# Patient Record
Sex: Female | Born: 1945
Health system: Southern US, Community
[De-identification: ages and names within clinical notes are randomized; demographics above are authoritative.]

## PROBLEM LIST (undated history)

## (undated) ENCOUNTER — Inpatient Hospital Stay: Admission: EM | Payer: Self-pay | Source: Home / Self Care

## (undated) DIAGNOSIS — G8929 Other chronic pain: Secondary | ICD-10-CM

## (undated) DIAGNOSIS — M549 Dorsalgia, unspecified: Secondary | ICD-10-CM

## (undated) DIAGNOSIS — J84112 Idiopathic pulmonary fibrosis: Secondary | ICD-10-CM

## (undated) DIAGNOSIS — N189 Chronic kidney disease, unspecified: Secondary | ICD-10-CM

## (undated) DIAGNOSIS — K219 Gastro-esophageal reflux disease without esophagitis: Secondary | ICD-10-CM

## (undated) DIAGNOSIS — R591 Generalized enlarged lymph nodes: Secondary | ICD-10-CM

## (undated) DIAGNOSIS — E7800 Pure hypercholesterolemia, unspecified: Secondary | ICD-10-CM

## (undated) DIAGNOSIS — F1910 Other psychoactive substance abuse, uncomplicated: Secondary | ICD-10-CM

## (undated) HISTORY — PX: ANTERIOR CERVICAL DISCECTOMY: SHX1160

## (undated) HISTORY — PX: SPINE SURGERY: SHX786

## (undated) HISTORY — DX: Idiopathic pulmonary fibrosis: J84.112

## (undated) HISTORY — PX: BACK SURGERY: SHX140

## (undated) HISTORY — PX: POSTERIOR CERVICAL FUSION/FORAMINOTOMY: SHX5038

## (undated) HISTORY — PX: CARPAL TUNNEL WITH CUBITAL TUNNEL: SHX5608

## (undated) HISTORY — DX: Generalized enlarged lymph nodes: R59.1

## (undated) HISTORY — DX: Chronic kidney disease, unspecified: N18.9

## (undated) HISTORY — PX: POSTERIOR LUMBAR FUSION: SHX6036

## (undated) HISTORY — DX: Other psychoactive substance abuse, uncomplicated: F19.10

## (undated) HISTORY — PX: LUMBAR DISC SURGERY: SHX700

---

## 1999-04-12 ENCOUNTER — Emergency Department (HOSPITAL_COMMUNITY): Admission: EM | Admit: 1999-04-12 | Discharge: 1999-04-12 | Payer: Self-pay | Admitting: Emergency Medicine

## 2000-11-26 ENCOUNTER — Encounter: Payer: Self-pay | Admitting: *Deleted

## 2000-11-26 ENCOUNTER — Emergency Department (HOSPITAL_COMMUNITY): Admission: EM | Admit: 2000-11-26 | Discharge: 2000-11-26 | Payer: Self-pay | Admitting: *Deleted

## 2002-10-26 ENCOUNTER — Emergency Department (HOSPITAL_COMMUNITY): Admission: EM | Admit: 2002-10-26 | Discharge: 2002-10-26 | Payer: Self-pay | Admitting: Emergency Medicine

## 2002-10-26 ENCOUNTER — Encounter: Payer: Self-pay | Admitting: Emergency Medicine

## 2003-01-31 ENCOUNTER — Encounter: Payer: Self-pay | Admitting: *Deleted

## 2003-01-31 ENCOUNTER — Emergency Department (HOSPITAL_COMMUNITY): Admission: EM | Admit: 2003-01-31 | Discharge: 2003-01-31 | Payer: Self-pay | Admitting: Emergency Medicine

## 2003-03-15 ENCOUNTER — Ambulatory Visit (HOSPITAL_COMMUNITY): Admission: RE | Admit: 2003-03-15 | Discharge: 2003-03-15 | Payer: Self-pay | Admitting: *Deleted

## 2003-03-15 ENCOUNTER — Encounter: Payer: Self-pay | Admitting: *Deleted

## 2003-07-01 ENCOUNTER — Emergency Department (HOSPITAL_COMMUNITY): Admission: EM | Admit: 2003-07-01 | Discharge: 2003-07-02 | Payer: Self-pay | Admitting: Emergency Medicine

## 2003-07-14 ENCOUNTER — Emergency Department (HOSPITAL_COMMUNITY): Admission: AD | Admit: 2003-07-14 | Discharge: 2003-07-14 | Payer: Self-pay | Admitting: Family Medicine

## 2003-07-19 ENCOUNTER — Emergency Department (HOSPITAL_COMMUNITY): Admission: AD | Admit: 2003-07-19 | Discharge: 2003-07-19 | Payer: Self-pay | Admitting: Family Medicine

## 2003-07-30 ENCOUNTER — Emergency Department (HOSPITAL_COMMUNITY): Admission: EM | Admit: 2003-07-30 | Discharge: 2003-07-30 | Payer: Self-pay | Admitting: Family Medicine

## 2003-09-21 ENCOUNTER — Encounter: Admission: RE | Admit: 2003-09-21 | Discharge: 2003-09-21 | Payer: Self-pay | Admitting: Orthopaedic Surgery

## 2003-10-05 ENCOUNTER — Emergency Department (HOSPITAL_COMMUNITY): Admission: EM | Admit: 2003-10-05 | Discharge: 2003-10-05 | Payer: Self-pay | Admitting: Family Medicine

## 2003-10-09 ENCOUNTER — Ambulatory Visit (HOSPITAL_COMMUNITY): Admission: RE | Admit: 2003-10-09 | Discharge: 2003-10-09 | Payer: Self-pay | Admitting: Orthopaedic Surgery

## 2003-11-12 ENCOUNTER — Encounter: Admission: RE | Admit: 2003-11-12 | Discharge: 2003-11-12 | Payer: Self-pay | Admitting: Orthopaedic Surgery

## 2003-12-11 ENCOUNTER — Encounter: Admission: RE | Admit: 2003-12-11 | Discharge: 2003-12-11 | Payer: Self-pay | Admitting: Orthopaedic Surgery

## 2004-05-23 ENCOUNTER — Ambulatory Visit (HOSPITAL_COMMUNITY): Admission: RE | Admit: 2004-05-23 | Discharge: 2004-05-23 | Payer: Self-pay | Admitting: Orthopaedic Surgery

## 2004-07-08 ENCOUNTER — Encounter: Admission: RE | Admit: 2004-07-08 | Discharge: 2004-07-08 | Payer: Self-pay | Admitting: Internal Medicine

## 2004-07-19 ENCOUNTER — Encounter: Admission: RE | Admit: 2004-07-19 | Discharge: 2004-07-19 | Payer: Self-pay | Admitting: Internal Medicine

## 2005-02-04 ENCOUNTER — Ambulatory Visit (HOSPITAL_COMMUNITY): Admission: RE | Admit: 2005-02-04 | Discharge: 2005-02-04 | Payer: Self-pay | Admitting: Orthopaedic Surgery

## 2005-02-13 ENCOUNTER — Encounter: Admission: RE | Admit: 2005-02-13 | Discharge: 2005-02-13 | Payer: Self-pay | Admitting: Orthopaedic Surgery

## 2005-03-01 ENCOUNTER — Encounter: Admission: RE | Admit: 2005-03-01 | Discharge: 2005-03-01 | Payer: Self-pay | Admitting: Orthopaedic Surgery

## 2005-07-19 ENCOUNTER — Encounter: Admission: RE | Admit: 2005-07-19 | Discharge: 2005-10-17 | Payer: Self-pay | Admitting: Orthopaedic Surgery

## 2005-08-08 ENCOUNTER — Emergency Department (HOSPITAL_COMMUNITY): Admission: EM | Admit: 2005-08-08 | Discharge: 2005-08-08 | Payer: Self-pay | Admitting: Family Medicine

## 2005-09-09 ENCOUNTER — Ambulatory Visit (HOSPITAL_COMMUNITY): Admission: RE | Admit: 2005-09-09 | Discharge: 2005-09-09 | Payer: Self-pay | Admitting: Orthopaedic Surgery

## 2005-12-15 ENCOUNTER — Inpatient Hospital Stay (HOSPITAL_COMMUNITY): Admission: RE | Admit: 2005-12-15 | Discharge: 2005-12-17 | Payer: Self-pay | Admitting: Orthopaedic Surgery

## 2006-02-10 ENCOUNTER — Emergency Department (HOSPITAL_COMMUNITY): Admission: EM | Admit: 2006-02-10 | Discharge: 2006-02-10 | Payer: Self-pay | Admitting: *Deleted

## 2006-05-30 ENCOUNTER — Emergency Department (HOSPITAL_COMMUNITY): Admission: EM | Admit: 2006-05-30 | Discharge: 2006-05-30 | Payer: Self-pay | Admitting: Family Medicine

## 2006-06-19 ENCOUNTER — Emergency Department (HOSPITAL_COMMUNITY): Admission: EM | Admit: 2006-06-19 | Discharge: 2006-06-19 | Payer: Self-pay | Admitting: Emergency Medicine

## 2006-09-01 ENCOUNTER — Encounter: Admission: RE | Admit: 2006-09-01 | Discharge: 2006-09-01 | Payer: Self-pay | Admitting: Orthopaedic Surgery

## 2007-02-11 ENCOUNTER — Observation Stay (HOSPITAL_COMMUNITY): Admission: RE | Admit: 2007-02-11 | Discharge: 2007-02-12 | Payer: Self-pay | Admitting: Orthopaedic Surgery

## 2007-06-10 ENCOUNTER — Encounter: Admission: RE | Admit: 2007-06-10 | Discharge: 2007-06-10 | Payer: Self-pay | Admitting: Orthopaedic Surgery

## 2007-09-12 ENCOUNTER — Ambulatory Visit: Payer: Self-pay | Admitting: Nurse Practitioner

## 2007-09-12 DIAGNOSIS — G5600 Carpal tunnel syndrome, unspecified upper limb: Secondary | ICD-10-CM

## 2007-10-02 ENCOUNTER — Telehealth (INDEPENDENT_AMBULATORY_CARE_PROVIDER_SITE_OTHER): Payer: Self-pay | Admitting: Nurse Practitioner

## 2007-10-09 ENCOUNTER — Ambulatory Visit: Payer: Self-pay | Admitting: Nurse Practitioner

## 2007-10-09 DIAGNOSIS — J309 Allergic rhinitis, unspecified: Secondary | ICD-10-CM

## 2008-03-23 ENCOUNTER — Ambulatory Visit: Payer: Self-pay | Admitting: Internal Medicine

## 2008-03-23 ENCOUNTER — Encounter (INDEPENDENT_AMBULATORY_CARE_PROVIDER_SITE_OTHER): Payer: Self-pay | Admitting: *Deleted

## 2008-03-23 DIAGNOSIS — J069 Acute upper respiratory infection, unspecified: Secondary | ICD-10-CM | POA: Insufficient documentation

## 2008-03-23 LAB — CONVERTED CEMR LAB
AST: 14 units/L (ref 0–37)
Albumin: 3.8 g/dL (ref 3.5–5.2)
Alkaline Phosphatase: 103 units/L (ref 39–117)
LDL Cholesterol: 107 mg/dL — ABNORMAL HIGH (ref 0–99)
Potassium: 4.4 meq/L (ref 3.5–5.3)
Sodium: 144 meq/L (ref 135–145)
Total Protein: 6.9 g/dL (ref 6.0–8.3)

## 2008-04-21 ENCOUNTER — Encounter: Payer: Self-pay | Admitting: *Deleted

## 2008-05-21 ENCOUNTER — Inpatient Hospital Stay (HOSPITAL_COMMUNITY): Admission: RE | Admit: 2008-05-21 | Discharge: 2008-05-22 | Payer: Self-pay | Admitting: Orthopaedic Surgery

## 2009-03-12 ENCOUNTER — Ambulatory Visit: Payer: Self-pay | Admitting: Infectious Diseases

## 2009-03-12 DIAGNOSIS — G609 Hereditary and idiopathic neuropathy, unspecified: Secondary | ICD-10-CM | POA: Insufficient documentation

## 2009-03-12 DIAGNOSIS — F17200 Nicotine dependence, unspecified, uncomplicated: Secondary | ICD-10-CM

## 2009-03-14 LAB — CONVERTED CEMR LAB
ALT: 12 units/L (ref 0–35)
BUN: 13 mg/dL (ref 6–23)
CO2: 23 meq/L (ref 19–32)
Calcium: 9.5 mg/dL (ref 8.4–10.5)
Chloride: 108 meq/L (ref 96–112)
Cholesterol: 200 mg/dL (ref 0–200)
Creatinine, Ser: 0.9 mg/dL (ref 0.40–1.20)
HCT: 39.8 % (ref 36.0–46.0)
HDL: 31 mg/dL — ABNORMAL LOW (ref >39)
Hemoglobin: 12.9 g/dL (ref 12.0–15.0)
Total CHOL/HDL Ratio: 6.5
WBC: 8.9 10*3/uL (ref 4.0–10.5)

## 2009-03-22 ENCOUNTER — Encounter: Payer: Self-pay | Admitting: Gastroenterology

## 2009-04-16 ENCOUNTER — Ambulatory Visit: Payer: Self-pay | Admitting: Internal Medicine

## 2009-04-16 DIAGNOSIS — M5440 Lumbago with sciatica, unspecified side: Secondary | ICD-10-CM

## 2009-05-05 ENCOUNTER — Encounter: Payer: Self-pay | Admitting: Internal Medicine

## 2009-05-07 ENCOUNTER — Telehealth: Payer: Self-pay | Admitting: Internal Medicine

## 2009-05-11 ENCOUNTER — Ambulatory Visit: Payer: Self-pay | Admitting: Internal Medicine

## 2009-07-01 ENCOUNTER — Telehealth: Payer: Self-pay | Admitting: Internal Medicine

## 2009-07-16 ENCOUNTER — Telehealth: Payer: Self-pay | Admitting: *Deleted

## 2009-08-10 ENCOUNTER — Ambulatory Visit: Payer: Self-pay | Admitting: Internal Medicine

## 2009-09-29 ENCOUNTER — Telehealth: Payer: Self-pay | Admitting: Internal Medicine

## 2009-10-01 ENCOUNTER — Telehealth: Payer: Self-pay | Admitting: Internal Medicine

## 2009-10-06 ENCOUNTER — Telehealth: Payer: Self-pay | Admitting: *Deleted

## 2009-10-14 ENCOUNTER — Telehealth: Payer: Self-pay | Admitting: Internal Medicine

## 2009-10-25 ENCOUNTER — Telehealth: Payer: Self-pay | Admitting: Internal Medicine

## 2009-10-25 ENCOUNTER — Ambulatory Visit: Payer: Self-pay | Admitting: Internal Medicine

## 2009-10-25 ENCOUNTER — Telehealth: Payer: Self-pay | Admitting: *Deleted

## 2009-10-25 DIAGNOSIS — H60399 Other infective otitis externa, unspecified ear: Secondary | ICD-10-CM | POA: Insufficient documentation

## 2009-11-25 ENCOUNTER — Ambulatory Visit: Payer: Self-pay | Admitting: Internal Medicine

## 2009-11-25 DIAGNOSIS — F3289 Other specified depressive episodes: Secondary | ICD-10-CM | POA: Insufficient documentation

## 2009-11-25 DIAGNOSIS — F329 Major depressive disorder, single episode, unspecified: Secondary | ICD-10-CM

## 2009-11-25 LAB — CONVERTED CEMR LAB
Eosinophils Absolute: 0.3 10*3/uL (ref 0.0–0.7)
Eosinophils Relative: 2 % (ref 0–5)
HCT: 41.1 % (ref 36.0–46.0)
Lymphs Abs: 2.9 10*3/uL (ref 0.7–4.0)
MCV: 96.5 fL (ref >=78.0)
Monocytes Absolute: 0.9 10*3/uL (ref 0.1–1.0)
Platelets: 314 10*3/uL (ref 150–400)
WBC: 10.3 10*3/uL (ref 4.0–10.5)

## 2009-12-01 ENCOUNTER — Telehealth: Payer: Self-pay | Admitting: Internal Medicine

## 2009-12-27 ENCOUNTER — Telehealth: Payer: Self-pay | Admitting: Internal Medicine

## 2009-12-28 ENCOUNTER — Telehealth: Payer: Self-pay | Admitting: Internal Medicine

## 2009-12-29 ENCOUNTER — Telehealth: Payer: Self-pay | Admitting: Internal Medicine

## 2010-01-01 ENCOUNTER — Encounter: Payer: Self-pay | Admitting: Internal Medicine

## 2010-01-27 ENCOUNTER — Ambulatory Visit: Payer: Self-pay | Admitting: Internal Medicine

## 2010-02-14 ENCOUNTER — Telehealth: Payer: Self-pay | Admitting: Internal Medicine

## 2010-02-18 ENCOUNTER — Telehealth: Payer: Self-pay | Admitting: Internal Medicine

## 2010-02-22 ENCOUNTER — Telehealth: Payer: Self-pay | Admitting: Internal Medicine

## 2010-03-10 ENCOUNTER — Telehealth: Payer: Self-pay | Admitting: Internal Medicine

## 2010-03-11 ENCOUNTER — Telehealth: Payer: Self-pay | Admitting: Internal Medicine

## 2010-03-25 ENCOUNTER — Telehealth: Payer: Self-pay | Admitting: *Deleted

## 2010-04-04 ENCOUNTER — Telehealth: Payer: Self-pay | Admitting: Internal Medicine

## 2010-04-07 ENCOUNTER — Telehealth: Payer: Self-pay | Admitting: Internal Medicine

## 2010-04-13 ENCOUNTER — Ambulatory Visit: Payer: Self-pay | Admitting: Internal Medicine

## 2010-04-15 ENCOUNTER — Encounter
Admission: RE | Admit: 2010-04-15 | Discharge: 2010-04-15 | Payer: Self-pay | Source: Home / Self Care | Attending: Physical Medicine & Rehabilitation | Admitting: Physical Medicine & Rehabilitation

## 2010-04-22 ENCOUNTER — Telehealth: Payer: Self-pay | Admitting: Internal Medicine

## 2010-04-26 ENCOUNTER — Telehealth: Payer: Self-pay | Admitting: Internal Medicine

## 2010-05-18 ENCOUNTER — Ambulatory Visit: Payer: Self-pay | Admitting: Internal Medicine

## 2010-05-18 ENCOUNTER — Ambulatory Visit (HOSPITAL_COMMUNITY)
Admission: RE | Admit: 2010-05-18 | Discharge: 2010-05-18 | Payer: Self-pay | Source: Home / Self Care | Admitting: Internal Medicine

## 2010-05-18 ENCOUNTER — Telehealth (INDEPENDENT_AMBULATORY_CARE_PROVIDER_SITE_OTHER): Payer: Self-pay | Admitting: *Deleted

## 2010-05-18 DIAGNOSIS — E785 Hyperlipidemia, unspecified: Secondary | ICD-10-CM

## 2010-05-18 LAB — CONVERTED CEMR LAB
BUN: 17 mg/dL (ref 6–23)
CO2: 26 meq/L (ref 19–32)
Chloride: 105 meq/L (ref 96–112)
Creatinine, Ser: 0.97 mg/dL (ref 0.40–1.20)
Glucose, Bld: 94 mg/dL (ref 70–99)
LDL Cholesterol: 139 mg/dL — ABNORMAL HIGH (ref 0–99)
RBC Folate: 425 ng/mL (ref 180–600)
TSH: 0.513 microintl units/mL (ref 0.350–4.5)
Vitamin B-12: 820 pg/mL (ref 211–911)

## 2010-05-30 ENCOUNTER — Telehealth: Payer: Self-pay | Admitting: *Deleted

## 2010-06-01 ENCOUNTER — Ambulatory Visit: Payer: Self-pay | Admitting: Internal Medicine

## 2010-06-01 LAB — CONVERTED CEMR LAB: OCCULT 1: NEGATIVE

## 2010-06-03 ENCOUNTER — Ambulatory Visit: Payer: Self-pay | Admitting: Internal Medicine

## 2010-06-03 DIAGNOSIS — L299 Pruritus, unspecified: Secondary | ICD-10-CM | POA: Insufficient documentation

## 2010-06-14 ENCOUNTER — Telehealth: Payer: Self-pay | Admitting: Internal Medicine

## 2010-07-06 ENCOUNTER — Telehealth (INDEPENDENT_AMBULATORY_CARE_PROVIDER_SITE_OTHER): Payer: Self-pay | Admitting: *Deleted

## 2010-07-07 ENCOUNTER — Other Ambulatory Visit: Payer: Self-pay | Admitting: Internal Medicine

## 2010-07-07 DIAGNOSIS — Z0000 Encounter for general adult medical examination without abnormal findings: Secondary | ICD-10-CM

## 2010-07-07 DIAGNOSIS — Z1231 Encounter for screening mammogram for malignant neoplasm of breast: Secondary | ICD-10-CM

## 2010-07-09 ENCOUNTER — Encounter: Payer: Self-pay | Admitting: Internal Medicine

## 2010-07-10 ENCOUNTER — Encounter: Payer: Self-pay | Admitting: *Deleted

## 2010-07-10 ENCOUNTER — Encounter: Payer: Self-pay | Admitting: Internal Medicine
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
## 2010-07-19 NOTE — Progress Notes (Signed)
Summary: phone/gg  Phone Note Refill Request  on October 14, 2009 1:40 PM  Pt request refill on flexeril,  c/o muscle spasms in leg and foot. They have worked for her in the past.   Method Requested: Electronic Initial call taken by: Merrie Roof RN,  October 14, 2009 1:42 PM    New/Updated Medications: FLEXERIL 10 MG TABS (CYCLOBENZAPRINE HCL) Take 1 tablet by mouth three times a day for 2-3 weeks Prescriptions: FLEXERIL 10 MG TABS (CYCLOBENZAPRINE HCL) Take 1 tablet by mouth three times a day for 2-3 weeks  #60 x 0   Entered and Authorized by:   Laren Everts MD   Signed by:   Laren Everts MD on 10/15/2009   Method used:   Electronically to        [facility] [address] [phone]* (retail)       [address]       [place], [place]  [address]       Ph: [phone] or [phone]       Fax: [phone]   RxID:   [id number]

## 2010-07-19 NOTE — Assessment & Plan Note (Signed)
Summary: est-ck/fu/meds/cfb   Vital Signs:  Patient profile:   65 year old female Height:      67.50 inches (171.45 cm) Weight:      184.06 pounds (83.66 kg) BMI:     28.51 Temp:     97.7 degrees F (36.50 degrees C) oral Pulse rate:   88 / minute BP sitting:   139 / 89  (left arm)  Vitals Entered By: Angelina Ok RN (August 10, 2009 2:46 PM) CC: Depression Is Patient Diabetic? No Pain Assessment Patient in pain? yes     Location: left leg Intensity: 5 Type: throbbing Onset of pain  Constant Nutritional Status BMI of 25 - 29 = overweight  Have you ever been in a relationship where you felt threatened, hurt or afraid?No   Does patient need assistance? Functional Status Self care Ambulation Normal Comments Cramping at night.  Disfiguring her foot.  Problems with legs.  Needs refill on meds.  ? Lyricia to continue.  Not taking 3 timesa day as ordered.   Primary Care Provider:  Laren Everts MD  CC:  Depression.  History of Present Illness: 65 yr old woman with pmhx as described below comes to the clinic for follow up. Patient got back injection on January 2011. Reports that back and leg pain have improved since getting injection. Patient continues to experience tingling especially in the bottom of her foot. Patient has not been taking Lyrica as directed becuase she forgets to take it. Denies saddle anesthesia, bowel or bladder incontinence, fever or chills.   Depression History:      The patient is having a depressed mood most of the day but denies diminished interest in her usual daily activities.        The patient denies that she feels like life is not worth living, denies that she wishes that she were dead, and denies that she has thought about ending her life.        Comments:  Worried about her mom.   Preventive Screening-Counseling & Management  Alcohol-Tobacco     Alcohol type: on holidays     Smoking Status: current     Smoking Cessation Counseling:  yes     Packs/Day: 3-4 per day  Problems Prior to Update: 1)  Back Pain With Radiculopathy  (ICD-729.2) 2)  Preventive Health Care  (ICD-V70.0) 3)  Peripheral Neuropathy, Lower Extremity, Left  (ICD-356.9) 4)  Tobacco Abuse  (ICD-305.1) 5)  Screening For Thyroid Disorder  (ICD-V77.0) 6)  Screening For Iron Deficiency Anemia  (ICD-V78.0) 7)  Screening For Diabetes Mellitus  (ICD-V77.1) 8)  Uri  (ICD-465.9) 9)  Allergic Rhinitis  (ICD-477.9) 10)  Carpal Tunnel Syndrome, Left  (ICD-354.0) 11)  Family History Diabetes 1st Degree Relative  (ICD-V18.0)  Medications Prior to Update: 1)  Lyrica 50 Mg Caps (Pregabalin) .... Take 1 Tablet By Mouth Three Times A Day 2)  Flexeril 10 Mg Tabs (Cyclobenzaprine Hcl) .... Take 1 Tablet By Mouth Three Times A Day For 2-3 Weeks 3)  Vicodin 5-500 Mg Tabs (Hydrocodone-Acetaminophen) .... Take 1 Tablet By Mouth Every 12 Hours As Needed For Pain  Current Medications (verified): 1)  Lyrica 50 Mg Caps (Pregabalin) .... Take 1 Tablet By Mouth Three Times A Day 2)  Vicodin 5-500 Mg Tabs (Hydrocodone-Acetaminophen) .... Take 1 Tablet By Mouth Every 12 Hours As Needed For Pain  Allergies: No Known Drug Allergies  Past History:  Past Medical History: Last updated: 03/23/2008 MEDIASTINAL AND BIHILAR LYMPHADNP    -  doc'd on chest CT scan 12/15/2005 (scan was done b/c of a ? mass on pre-op CXR - no mass on CT) TOBACCO ABUSE    - emphysematous changes on chest CT scan 12/15/2005 s/p lumbar MRI by Dr. Ophelia Charter (?renal cyst)  Past Surgical History: Last updated: 01/23/2008 CERVICAL SPONDYLOSIS    - s/p C3-4, C4-5 diskectomy, fusion, plating and allograft (12/15/2005 by Dr. Annell Greening)    - s/p posterior C3-5 fusion with wiring (02/11/2007 by Dr. Annell Greening) - had pseudoarthrosis lumbar surgery 2000  Family History: Last updated: 09/12/2007 Family History Diabetes 1st degree relative - father Family History Hypertension - father  Social History: Last updated:  01/23/2008 No children. Lives with her mother who has Alzheimer's dementia and a psychotic disorder. Current Smoker (5 cigarettes per day) Alcohol use-yes (social during the holidays) Drug use-no  Risk Factors: Caffeine Use: 3 (04/16/2009) Exercise: yes (04/16/2009)  Risk Factors: Smoking Status: current (08/10/2009) Packs/Day: 3-4 per day (08/10/2009)  Family History: Reviewed history from 09/12/2007 and no changes required. Family History Diabetes 1st degree relative - father Family History Hypertension - father  Social History: Reviewed history from 01/23/2008 and no changes required. No children. Lives with her mother who has Alzheimer's dementia and a psychotic disorder. Current Smoker (5 cigarettes per day) Alcohol use-yes (social during the holidays) Drug use-no Packs/Day:  3-4 per day  Review of Systems       The patient complains of difficulty walking.  The patient denies fever, chest pain, dyspnea on exertion, peripheral edema, hemoptysis, abdominal pain, melena, hematochezia, hematuria, muscle weakness, and unusual weight change.    Physical Exam  General:  NAD Mouth:  MMM Neck:  supple.   Lungs:  normal breath sounds.  no crackles and no wheezes.   Heart:  normal rate and regular rhythm.   Abdomen:  soft, non-tender, and normal bowel sounds.   Msk:  no joint swelling, no joint warmth, and no redness over joints.    Extremities:  no edema Neurologic:  sensation to left leg diminished, alert & oriented X3 and cranial nerves II-XII intact.     Impression & Recommendations:  Problem # 1:  BACK PAIN WITH RADICULOPATHY (ICD-729.2) Stable. Will contineu current pain management.   Problem # 2:  PERIPHERAL NEUROPATHY, LOWER EXTREMITY, LEFT (ICD-356.9) Patient was instructed to start taking lyrica as directed. Will consider increasing medication on follow up.  Problem # 3:  PREVENTIVE HEALTH CARE (ICD-V70.0) Patient was instructed to make colonoscopy and pap  smear appointments. Will follow up.  Complete Medication List: 1)  Lyrica 50 Mg Caps (Pregabalin) .... Take 1 tablet by mouth three times a day 2)  Vicodin 5-500 Mg Tabs (Hydrocodone-acetaminophen) .... Take 1 tablet by mouth every 12 hours as needed for pain  Patient Instructions: 1)  Please schedule a follow-up appointment in 1 month. 2)  Make appoinment for Colonoscopy and Pap smear. 3)  Take all medication as directed. Prescriptions: VICODIN 5-500 MG TABS (HYDROCODONE-ACETAMINOPHEN) Take 1 tablet by mouth every 12 hours as needed for pain  #30 x 1   Entered and Authorized by:   Laren Everts MD   Signed by:   Laren Everts MD on 08/10/2009   Method used:   Print then Give to Patient   RxID:   [id number]   Prevention & Chronic Care Immunizations   Influenza vaccine: Fluvax MCR  (05/11/2009)    Tetanus booster: Not documented    Pneumococcal vaccine: Not documented    H. zoster vaccine: Not  documented  Colorectal Screening   Hemoccult: Not documented    Colonoscopy: Not documented   Colonoscopy action/deferral: GI referral  (03/12/2009)  Other Screening   Pap smear: Not documented    Mammogram: No specific mammographic evidence of malignancy.  Assessment: BIRADS 1.   (07/19/2004)   Mammogram action/deferral: Ordered  (03/12/2009)    DXA bone density scan: Not documented   Smoking status: current  (08/10/2009)   Smoking cessation counseling: yes  (08/10/2009)  Lipids   Total Cholesterol: 200  (03/12/2009)   Lipid panel action/deferral: Lipid Panel ordered   LDL: 145  (03/12/2009)   LDL Direct: Not documented   HDL: 31  (03/12/2009)   Triglycerides: 121  (03/12/2009)

## 2010-07-19 NOTE — Progress Notes (Signed)
Summary: Refill/gh  Phone Note Refill Request Message from:  Patient on February 18, 2010 12:48 PM  Refills Requested: Medication #1:  Amoxicillin 500 mg capsules # 10 1 po bid   Last Refilled: 02/03/2010 Call to pt to see why she needed the antibiotic.  No answer.  Message left for the pt to call the Clinics. Pt states the inside of right ear is peeling and red, no pain.  She used antibiotics for this in the past.   Method Requested: Electronic Initial call taken by: Angelina Ok RN,  February 18, 2010 12:49 PM    Patient needs to be seen in the clinic. Can not assess over the phone whether there is a real indication for antibiotics.

## 2010-07-19 NOTE — Progress Notes (Signed)
Summary: phone/gg  Phone Note Call from Patient   Caller: Patient Summary of Call: received call from pt at 4:57 asking for pain meds. I took the call off after 5:00 so could not send to attending. I called pt and told her she called when we were closing.  She will call the resident on call.  I advised her to make appointment in clinic on monday to discuss pain meds.  I tried to call pt today several times and phone was turned OFF Initial call taken by: Merrie Roof RN,  July 16, 2009 5:10 PM

## 2010-07-19 NOTE — Progress Notes (Signed)
Summary: Medication  Phone Note Outgoing Call   Call placed by: Angelina Ok RN,  April 07, 2010 12:01 PM Call placed to: Specialist Summary of Call: Call from pt about her refill for Vicodin.  Call to pharmacy-pt has gotten Vicodin 10/325 mg # 30 from Dr. Ethelene Hal on 02/17/2010, 03/08/2010 and 10/113/2011. Pharmacy needs to know if they shuld fill the Vicodin 5/500 # 30 for this pt. ? Angelina Ok RN  April 07, 2010 12:03 PM  Initial call taken by: Angelina Ok RN,  April 07, 2010 12:03 PM    Do not fill.  Appended Document: Medication Pt informed and stated she got the vicodin from Dr Ethelene Hal but does not take them. I told her if she returned a full bottle of the other strength then we would refill.

## 2010-07-19 NOTE — Progress Notes (Signed)
  Phone Note Call from Patient   Reason for Call: Refill Medication, Talk to Doctor Summary of Call: Pt repeatedly argued over prescribing pain medication. I told her that Dr. Cena Benton has instructed to take 75mg  of lyrica. She confirmed that she does have lyrica. However she wants me to prescribe strong pain pills over the phone. I told her that we do not provide pain pills/narcotics prescription over the phone , after hours. She did not agree and continued to argue. She then proceeded to ask my name, which I provided. Pt will call back tomorrow in regular hours.  Initial call taken by: Clerance Lav MD,  December 28, 2009 7:40 PM

## 2010-07-19 NOTE — Progress Notes (Signed)
Summary: refill/ hla  Phone Note Refill Request Message from:  Fax from Pharmacy on July 01, 2009 10:23 AM  Refills Requested: Medication #1:  VICODIN 5-500 MG TABS Take 1 tablet by mouth every 12 hours as needed for pain.   Last Refilled: 12/17 Initial call taken by: Marin Roberts RN,  July 01, 2009 10:23 AM    Prescriptions: VICODIN 5-500 MG TABS (HYDROCODONE-ACETAMINOPHEN) Take 1 tablet by mouth every 12 hours as needed for pain  #20 x 0   Entered and Authorized by:   Laren Everts MD   Signed by:   Laren Everts MD on 07/02/2009   Method used:   Telephoned to ...       [facility] [address] [phone]* (retail)       [address]       [place], [place]  [address]       Ph: [phone] or [phone]       Fax: [phone]   RxID:   [id number]   Appended Document: refill/ hla Prescription for Vicodin 5-500 mg tablets [facility] [address] [facility] order of Dr. Cena Benton.  Attempt to call pt no answer to inform her that the prescription had been called in. Unable to leave a message. Angelina Ok, RN July 02, 2009 1:58 PM

## 2010-07-19 NOTE — Progress Notes (Signed)
Summary: appt for referral/ hla  Phone Note Call from Patient   Summary of Call: pt calls desires referral to dermatologist, appt given w/ dr Cena Benton Initial call taken by: Marin Roberts RN,  October 06, 2009 3:36 PM

## 2010-07-19 NOTE — Progress Notes (Signed)
Summary: REfill  Phone Note Call from Patient   Caller: Patient Call For: Laren Everts MD Summary of Call: Call from pt said that her appointment at the Pain Clinic is  not until 04/22/2010.  Still in pain would like to get a refill on the Vicodin until then.  [facility] [address] Initial call taken by: Angelina Ok RN,  April 04, 2010 2:33 PM    Prescriptions: VICODIN 5-500 MG TABS (HYDROCODONE-ACETAMINOPHEN) Take 1 tablet by mouth every 12 hours as needed for pain  #30 x 0   Entered and Authorized by:   Laren Everts MD   Signed by:   Laren Everts MD on 04/04/2010   Method used:   Telephoned to ...       [facility] [address] [phone]* (retail)       [address]       [place], [place]  [address]       Ph: [phone] or [phone]       Fax: [phone]   RxID:   [id number]   Appended Document: REfill [facility]in [facility]

## 2010-07-19 NOTE — Progress Notes (Signed)
Summary: Medication  Phone Note Call from Patient   Caller: Patient Call For: Laren Everts MD Reason for Call: Referral Summary of Call: Call from pt said that her Lyrica may be to strong.  Said that the medication is causing her to be dizzy.  RTC to pt message left that the Clinics had returned her call and that a message would be sent to Dr. Cena Benton.  If she has not gotten a call back by the am she is to call the Clinics again.Angelina Ok RN  December 01, 2009 5:36 PM  Initial call taken by: Angelina Ok RN,  December 01, 2009 5:36 PM  Follow-up for Phone Call        RTC call to pt no answer.  Unable to leave a message on her answering machine.  Follow-up by: Angelina Ok RN,  December 07, 2009 2:12 PM  Additional Follow-up for Phone Call Additional follow up Details #1::        Pt. called back this morning; continues to c/o left leg pain. Stated she has been up all night.  She wants her MD to order an x-ray  or prescribe something else for pain or "just do something else".  Stated the pain is becoming unbearable. Stated Lyrica 100mg  makes her dizzy; I instructed her reduce dose back to 50mg  per Dr. Kirby Crigler note below. I will send phone note to Dr. Cena Benton. Additional Follow-up by: Chinita Pester RN,  December 09, 2009 9:58 AM    Additional Follow-up for Phone Call Additional follow up Details #2::    I talked to Dr. Cena Benton; pt. needs an appt to be seen. I called the pt. and she will call back Monday to schedule an appt. Follow-up by: Chinita Pester RN,  December 09, 2009 5:21 PM  If patient can not tolerate Lyrica 100mg  by mouth three times a day then she can go back to 50mg  by mouth three times a day.   Thanks, Dr. Cena Benton

## 2010-07-19 NOTE — Assessment & Plan Note (Signed)
Summary: derm referral and checkup/pcp-vega/hla   Vital Signs:  Patient profile:   65 year old female Height:      67.50 inches (171.45 cm) Weight:      186.8 pounds (84.91 kg) BMI:     28.93 Temp:     98.4 degrees F (36.89 degrees C) oral Pulse rate:   97 / minute BP sitting:   134 / 89  (right arm)  Vitals Entered By: Cynda Familia Duncan Dull) (November 25, 2009 2:19 PM) CC: has derm appt tomorrow with Dr Terri Piedra, c/o cramps in leg/foot, back pain and left leg pain Is Patient Diabetic? No Pain Assessment Patient in pain? yes     Location: back radiating down left leg Intensity: 6 Type: throbbing Onset of pain  chronic-worse with activity Nutritional Status BMI of 25 - 29 = overweight  Have you ever been in a relationship where you felt threatened, hurt or afraid?No   Does patient need assistance? Functional Status Self care Ambulation Normal   Primary Care Provider:  Laren Everts MD  CC:  has derm appt tomorrow with Dr Terri Piedra, c/o cramps in leg/foot, and back pain and left leg pain.  History of Present Illness: 65 yr old woman with pmhx as described below comes to the clinic reporting that she continue to have secretions from her right ear. Discharge is noticed on pillow when she wakes up. Denies fever or chills. Reports that it got better with antibiotics but she picked at it and discharge returned.   Patient crying because she is still morning her mother's death. Confused about what happened to her. Denies SSI/HSI. She does have some support which is helping her cope.   Complains of sharp pain running down her left leg that has gotten better since she started to take lyrica but would like more relieve of the pain. Denies any bowel or bladder incontinence, new weakness, numbing, or tingling.   Patient reports that its been 5 months since her steroid injection given in her lower back for back pain. Recently her back pain has worsened. Has been using voltaren gel  without much relieve of pain. Vicodin does help pain. No new trauma.      Preventive Screening-Counseling & Management  Alcohol-Tobacco     Alcohol type: on holidays     Smoking Status: current     Smoking Cessation Counseling: yes     Packs/Day: 3-4 per day  Problems Prior to Update: 1)  External Otitis  (ICD-380.10) 2)  Back Pain With Radiculopathy  (ICD-729.2) 3)  Preventive Health Care  (ICD-V70.0) 4)  Peripheral Neuropathy, Lower Extremity, Left  (ICD-356.9) 5)  Tobacco Abuse  (ICD-305.1) 6)  Screening For Thyroid Disorder  (ICD-V77.0) 7)  Screening For Iron Deficiency Anemia  (ICD-V78.0) 8)  Screening For Diabetes Mellitus  (ICD-V77.1) 9)  Uri  (ICD-465.9) 10)  Allergic Rhinitis  (ICD-477.9) 11)  Carpal Tunnel Syndrome, Left  (ICD-354.0) 12)  Family History Diabetes 1st Degree Relative  (ICD-V18.0)  Medications Prior to Update: 1)  Lyrica 50 Mg Caps (Pregabalin) .... Take 1 Tablet By Mouth Three Times A Day 2)  Vicodin 5-500 Mg Tabs (Hydrocodone-Acetaminophen) .... Take 1 Tablet By Mouth Every 12 Hours As Needed For Pain 3)  Flexeril 10 Mg Tabs (Cyclobenzaprine Hcl) .... Take 1 Tablet By Mouth Three Times A Day For 2-3 Weeks 4)  Ciprofloxacin Hcl 750 Mg Tabs (Ciprofloxacin Hcl) .... Take 1 Tablet By Mouth Two Times A Day For 7 Days.  Current Medications (verified): 1)  Lyrica 50 Mg Caps (Pregabalin) .... Take 1 Tablet By Mouth Three Times A Day 2)  Vicodin 5-500 Mg Tabs (Hydrocodone-Acetaminophen) .... Take 1 Tablet By Mouth Every 12 Hours As Needed For Pain 3)  Flexeril 10 Mg Tabs (Cyclobenzaprine Hcl) .... Take 1 Tablet By Mouth Three Times A Day For 2-3 Weeks  Allergies: No Known Drug Allergies  Past History:  Past Medical History: Last updated: 03/23/2008 MEDIASTINAL AND BIHILAR LYMPHADNP    - doc'd on chest CT scan 12/15/2005 (scan was done b/c of a ? mass on pre-op CXR - no mass on CT) TOBACCO ABUSE    - emphysematous changes on chest CT scan 12/15/2005 s/p  lumbar MRI by Dr. Ophelia Charter (?renal cyst)  Past Surgical History: Last updated: 01/23/2008 CERVICAL SPONDYLOSIS    - s/p C3-4, C4-5 diskectomy, fusion, plating and allograft (12/15/2005 by Dr. Annell Greening)    - s/p posterior C3-5 fusion with wiring (02/11/2007 by Dr. Annell Greening) - had pseudoarthrosis lumbar surgery 2000  Family History: Last updated: 09/12/2007 Family History Diabetes 1st degree relative - father Family History Hypertension - father  Social History: Last updated: 11/25/2009 No children. Mother recently died in 09/21/2022.  Current Smoker (5 cigarettes per day) Alcohol use-yes (social during the holidays) Drug use-no  Risk Factors: Caffeine Use: 3 (10/25/2009) Exercise: yes (10/25/2009)  Risk Factors: Smoking Status: current (11/25/2009) Packs/Day: 3-4 per day (11/25/2009)  Family History: Reviewed history from 09/12/2007 and no changes required. Family History Diabetes 1st degree relative - father Family History Hypertension - father  Social History: Reviewed history from 01/23/2008 and no changes required. No children. Mother recently died in Sep 21, 2022.  Current Smoker (5 cigarettes per day) Alcohol use-yes (social during the holidays) Drug use-no  Review of Systems  The patient denies fever, chest pain, dyspnea on exertion, peripheral edema, hemoptysis, abdominal pain, melena, hematochezia, hematuria, incontinence, muscle weakness, difficulty walking, and unusual weight change.    Physical Exam  General:  NAD Ears:  Right external ear, few excoriations, and erythema with mild cellulitic changes. There is no visible discharge, abscess. Ear canal looks clean Mouth:  MMM Neck:  No deformities, masses, or tenderness noted. Lungs:  Normal respiratory effort, chest expands symmetrically. Lungs are clear to auscultation, no crackles or wheezes. Heart:  Normal rate and regular rhythm. S1 and S2 normal without gallop, murmur, click, rub or other extra sounds. Abdomen:   soft, non-tender, and normal bowel sounds.   Msk:  no joint swelling, no joint warmth, and no redness over joints.     Extremities:  no edema Neurologic:  alert & oriented X3, cranial nerves II-XII intact, and gait normal.  Has decreased sensation on left leg. strength normal in all extremities.   Psych:  depressed affect and tearful.     Impression & Recommendations:  Problem # 1:  EXTERNAL OTITIS (ICD-380.10) Will start 5 day course of amoxicillin. Instructed to stop picking at ear. Reasses in one month.  Orders: T-CBC w/Diff ([id number]) T-CMP with Estimated GFR ([id number])  Problem # 2:  BACK PAIN WITH RADICULOPATHY (ICD-729.2) Patient instructed to schedule an appointment with Orthopedic to reasses the need to repeat steroid injection. In the meantime will continue current pain regimen. No red alarms concerning for cauda equina syndrome.   Problem # 3:  PERIPHERAL NEUROPATHY, LOWER EXTREMITY, LEFT (ICD-356.9) Increased lyrica today. Reasses on follow up.   Problem # 4:  PREVENTIVE HEALTH CARE (ICD-V70.0) Instructed to schedule pap smear, and mammogram.  Problem # 5:  DEPRESSION (ICD-311)  2/2 to mother's death. Patient will be started on Celexa. No SSI/HSI. Reasses response to medication in one month.  Her updated medication list for this problem includes:    Celexa 20 Mg Tabs (Citalopram hydrobromide) .Marland Kitchen... Take 1 tablet by mouth once a day  Complete Medication List: 1)  Lyrica 100 Mg Caps (Pregabalin) .... Take 1 tablet by mouth three times a day 2)  Vicodin 5-500 Mg Tabs (Hydrocodone-acetaminophen) .... Take 1 tablet by mouth every 12 hours as needed for pain 3)  Flexeril 10 Mg Tabs (Cyclobenzaprine hcl) .... Take 1 tablet by mouth three times a day for 2-3 weeks 4)  Amoxicillin 500 Mg Caps (Amoxicillin) .... Take 1 tablet by mouth two times a day 5)  Celexa 20 Mg Tabs (Citalopram hydrobromide) .... Take 1 tablet by mouth once a day  Patient Instructions: 1)  Please  schedule a follow-up appointment in 1 month. 2)  Take all medication as directed. 3)  You will be called with any abnormalities in the tests scheduled or performed today.  If you don't hear from [place] within a week from when the test was performed, you can assume that your test was normal.  Prescriptions: CELEXA 20 MG TABS (CITALOPRAM HYDROBROMIDE) Take 1 tablet by mouth once a day  #30 x 3   Entered and Authorized by:   Laren Everts MD   Signed by:   Laren Everts MD on 11/25/2009   Method used:   Print then Give to Patient   RxID:   [id number] AMOXICILLIN 500 MG CAPS (AMOXICILLIN) Take 1 tablet by mouth two times a day  #10 x 0   Entered and Authorized by:   Laren Everts MD   Signed by:   Laren Everts MD on 11/25/2009   Method used:   Print then Give to Patient   RxID:   [id number] FLEXERIL 10 MG TABS (CYCLOBENZAPRINE HCL) Take 1 tablet by mouth three times a day for 2-3 weeks  #60 x 0   Entered and Authorized by:   Laren Everts MD   Signed by:   Laren Everts MD on 11/25/2009   Method used:   Print then Give to Patient   RxID:   [id number] VICODIN 5-500 MG TABS (HYDROCODONE-ACETAMINOPHEN) Take 1 tablet by mouth every 12 hours as needed for pain  #30 x 0   Entered and Authorized by:   Laren Everts MD   Signed by:   Laren Everts MD on 11/25/2009   Method used:   Print then Give to Patient   RxID:   [id number] LYRICA 100 MG CAPS (PREGABALIN) Take 1 tablet by mouth three times a day  #90 x 3   Entered and Authorized by:   Laren Everts MD   Signed by:   Laren Everts MD on 11/25/2009   Method used:   Print then Give to Patient   RxID:   [id number] CELEXA 20 MG TABS (CITALOPRAM HYDROBROMIDE) Take 1 tablet by mouth once a day  #30 x 3   Entered and Authorized by:   Laren Everts MD   Signed by:   Laren Everts MD on 11/25/2009   Method used:    [facility] to        [facility] [address] [phone]* (retail)       [address]       [place], [place]  [address]       Ph: [phone] or [phone]  Fax: [phone]   RxID:   [id number] AMOXICILLIN 500 MG CAPS (AMOXICILLIN) Take 1 tablet by mouth two times a day  #10 x 0   Entered and Authorized by:   Laren Everts MD   Signed by:   Laren Everts MD on 11/25/2009   Method used:   [facility]ronically to        [facility] [address] [phone]* (retail)       [address]       [place], [place]  [address]       Ph: [phone] or [phone]       Fax: [phone]   RxID:   [id number]  Process Orders Check Orders Results:     [facility]: Check successful Tests Sent for requisitioning (November 29, 2009 11:52 AM):     11/25/2009: [facility] -- [facility] w/Diff [[id number]] (signed)    Prevention & Chronic Care Immunizations   Influenza vaccine: Fluvax MCR  (05/11/2009)    Tetanus booster: Not documented    Pneumococcal vaccine: Not documented    H. zoster vaccine: Not documented  Colorectal Screening   Hemoccult: Not documented    Colonoscopy: Not documented   Colonoscopy action/deferral: GI referral  (03/12/2009)  Other Screening   Pap smear: Not documented    Mammogram: No specific mammographic evidence of malignancy.  Assessment: BIRADS 1.   (07/19/2004)   Mammogram action/deferral: Ordered  (03/12/2009)    DXA bone density scan: Not documented   Smoking status: current  (11/25/2009)   Smoking cessation counseling: yes  (11/25/2009)  Lipids   Total Cholesterol: 200  (03/12/2009)   Lipid panel action/deferral: Lipid Panel ordered   LDL: 145  (03/12/2009)   LDL Direct: Not documented   HDL: 31  (03/12/2009)   Triglycerides: 121  (03/12/2009)

## 2010-07-19 NOTE — Assessment & Plan Note (Signed)
Summary: ACUTE-CHECK UP UNABLE TO [facility] WITH DR VEGA/CFB   Vital Signs:  Patient profile:   65 year old female Height:      67.50 inches Weight:      198.2 pounds BMI:     30.69 Temp:     97.6 degrees F oral Pulse rate:   101 / minute BP sitting:   127 / 78  (right arm)  Vitals Entered By: Filomena Jungling NT II (May 18, 2010 2:51 PM) CC: left leg, cramps  pain in feet,  Is Patient Diabetic? No Pain Assessment Patient in pain? yes     Location: left  leg Intensity: 8 Type: aching Onset of pain  Chronic Nutritional Status BMI of > 30 = obese  Have you ever been in a relationship where you felt threatened, hurt or afraid?No   Does patient need assistance? Functional Status Self care Ambulation Normal   Primary Care Provider:  Laren Everts MD  CC:  left leg, cramps  pain in feet, and .  History of Present Illness: pt is a 64 yr AAF with PMH of peripheral neuropathy and back pain who came here for f/u her left leg pain and numbness. The pain is burning, worse in the morning. This has been over a year following back surgery. Seh feels neurontin helps her leg pain. Seh has no other c/o. This after she had left leg Doppler and no DVT per patient.She has quitted smoking, no drug or ETOH abuse.   Depression History:      The patient denies a depressed mood most of the day and a diminished interest in her usual daily activities.         Preventive Screening-Counseling & Management  Alcohol-Tobacco     Alcohol type: on holidays     Smoking Status: current     Smoking Cessation Counseling: yes     Packs/Day: 3-4 per day  Caffeine-Diet-Exercise     Caffeine use/day: 3     Does Patient Exercise: yes     Type of exercise: WALKING     Exercise (avg: min/session): 30-60     Times/week: 1  Problems Prior to Update: 1)  Depression  (ICD-311) 2)  External Otitis  (ICD-380.10) 3)  Back Pain With Radiculopathy  (ICD-729.2) 4)  Preventive Health Care  (ICD-V70.0) 5)   Peripheral Neuropathy, Lower Extremity, Left  (ICD-356.9) 6)  Tobacco Abuse  (ICD-305.1) 7)  Screening For Thyroid Disorder  (ICD-V77.0) 8)  Screening For Iron Deficiency Anemia  (ICD-V78.0) 9)  Screening For Diabetes Mellitus  (ICD-V77.1) 10)  Uri  (ICD-465.9) 11)  Allergic Rhinitis  (ICD-477.9) 12)  Carpal Tunnel Syndrome, Left  (ICD-354.0) 13)  Family History Diabetes 1st Degree Relative  (ICD-V18.0)  Medications Prior to Update: 1)  Vicodin 5-500 Mg Tabs (Hydrocodone-Acetaminophen) .... Take 1 Tablet By Mouth Every 12 Hours As Needed For Pain 2)  Flexeril 10 Mg Tabs (Cyclobenzaprine Hcl) .... Take 1 Tablet By Mouth Three Times A Day For 2-3 Weeks 3)  Neurontin 300 Mg Caps (Gabapentin) .... Take 1 Tablet By Mouth Once A Day On Day 1, Then Two Times A Day On Day 2, Then Three Times A Day  Current Medications (verified): 1)  Vicodin 5-500 Mg Tabs (Hydrocodone-Acetaminophen) .... Take 1 Tablet By Mouth Every 12 Hours As Needed For Pain 2)  Flexeril 10 Mg Tabs (Cyclobenzaprine Hcl) .... Take 1 Tablet By Mouth Three Times A Day For 2-3 Weeks 3)  Neurontin 300 Mg Caps (Gabapentin) .... Take 1  Tablet By Mouth Once A Day On Day 1, Then Two Times A Day On Day 2, Then Three Times A Day  Allergies (verified): No Known Drug Allergies  Past History:  Past Medical History: Last updated: 03/23/2008 MEDIASTINAL AND BIHILAR LYMPHADNP    - doc'd on chest CT scan 12/15/2005 (scan was done b/c of a ? mass on pre-op CXR - no mass on CT) TOBACCO ABUSE    - emphysematous changes on chest CT scan 12/15/2005 s/p lumbar MRI by Dr. Ophelia Charter (?renal cyst)  Past Surgical History: Last updated: 01/23/2008 CERVICAL SPONDYLOSIS    - s/p C3-4, C4-5 diskectomy, fusion, plating and allograft (12/15/2005 by Dr. Annell Greening)    - s/p posterior C3-5 fusion with wiring (02/11/2007 by Dr. Annell Greening) - had pseudoarthrosis lumbar surgery 2000  Family History: Last updated: 09/12/2007 Family History Diabetes 1st degree  relative - father Family History Hypertension - father  Social History: Last updated: 11/25/2009 No children. Mother recently died in 08/31/2022.  Current Smoker (5 cigarettes per day) Alcohol use-yes (social during the holidays) Drug use-no  Risk Factors: Smoking Status: current (05/18/2010) Packs/Day: 3-4 per day (05/18/2010)  Family History: Reviewed history from 09/12/2007 and no changes required. Family History Diabetes 1st degree relative - father Family History Hypertension - father  Social History: Reviewed history from 11/25/2009 and no changes required. No children. Mother recently died in 08/31/22.  Current Smoker (5 cigarettes per day) Alcohol use-yes (social during the holidays) Drug use-no  Review of Systems  The patient denies fever, chest pain, syncope, dyspnea on exertion, peripheral edema, prolonged cough, headaches, hemoptysis, abdominal pain, melena, and hematochezia.    Physical Exam  General:  alert, well-developed, well-nourished, and well-hydrated.   Head:  normocephalic.   Nose:  no nasal discharge.   Mouth:  pharynx pink and moist.   Neck:  supple.   Lungs:  normal respiratory effort, no accessory muscle use, normal breath sounds, no crackles, and no wheezes.   Heart:  normal rate, regular rhythm, no murmur, and no JVD.   Abdomen:  soft, non-tender, normal bowel sounds, and no distention.   Msk:  normal ROM, no joint tenderness, no joint swelling, no joint warmth, and no redness over joints. Left lwer leg mild tenderness.  Pulses:  2+ Extremities:  No edema.  Neurologic:  alert & oriented X3, cranial nerves II-XII intact, strength normal in all extremities, and sensation intact to light touch except Left foot and lower mild decreased sensation.     Impression & Recommendations:  Problem # 1:  PERIPHERAL NEUROPATHY, LOWER EXTREMITY, LEFT (ICD-356.9) Assessment Improved  This may be due to peripheral nerve injury from surgery or DJD progression.  Other possible including thyroid or Vit B12 deficiency. DVT is unlikely. Will check BMET, TSH, B12 and folate. Will increase her neurontin and refill vicodin. She has not seen her orthopedics for a long time and advised her to make an appointment with them. Will recheck in a month.  Orders: T-TSH ([phone]) T-Folic Acid; RBC [phone]) T-Vitamin B12 ([phone]) T-Basic Metabolic Panel ([id number])  Problem # 2:  PREVENTIVE HEALTH CARE (ICD-V70.0) Assessment: Comment Only Will check FOBT and she will call for Pap smear and mammogram as she has missed her appointments.   Problem # 3:  HYPERLIPIDEMIA (ICD-272.4) Assessment: Unchanged Will check FLP and amy start statin if LDL high.  Orders: T-Lipid Profile [phone])  Labs Reviewed: SGOT: 18 (03/12/2009)   SGPT: 12 (03/12/2009)   HDL:31 (03/12/2009), 26 (03/23/2008)  LDL:145 (03/12/2009), 107 (  03/23/2008)  Chol:200 (03/12/2009), 164 (03/23/2008)  Trig:121 (03/12/2009), 156 (03/23/2008)  Complete Medication List: 1)  Vicodin 5-500 Mg Tabs (Hydrocodone-acetaminophen) .... Take 1 tablet by mouth every 12 hours as needed for pain 2)  Flexeril 10 Mg Tabs (Cyclobenzaprine hcl) .... Take 1 tablet by mouth three times a day for 2-3 weeks 3)  Neurontin 300 Mg Caps (Gabapentin) .... Take 2 tablets by mouth three times a day  Other Orders: T-Hemoccult Card-Multiple (take home) ([id number])  Patient Instructions: 1)  Please schedule a follow-up appointment in 1 month with PCP. 2)  Will call you if any abnormal labs. 3)  Please call for Papsmear and mammogram.  Prescriptions: VICODIN 5-500 MG TABS (HYDROCODONE-ACETAMINOPHEN) Take 1 tablet by mouth every 12 hours as needed for pain  #60 x 0   Entered and Authorized by:   Jackson Latino MD   Signed by:   Jackson Latino MD on 05/18/2010   Method used:   Print then Give to Patient   RxID:   [id number]    Orders Added: 1)  T-Hemoccult Card-Multiple (take home) [[id number]] 2)  T-TSH  [[id number]] 3)  T-Folic Acid; RBC [[id number]] 4)  T-Vitamin B12 [[id number]] 5)  T-Lipid Profile [[id number]] 6)  T-Basic Metabolic Panel [[id number]] 7)  Est. Patient Level IV [37106]    Prevention & Chronic Care Immunizations   Influenza vaccine: Fluvax MCR  (05/11/2009)   Influenza vaccine deferral: Refused  (05/18/2010)    Tetanus booster: Not documented    Pneumococcal vaccine: Not documented    H. zoster vaccine: Not documented  Colorectal Screening   Hemoccult: Not documented   Hemoccult action/deferral: Ordered  (05/18/2010)    Colonoscopy: Not documented   Colonoscopy action/deferral: GI referral  (03/12/2009)  Other Screening   Pap smear: Not documented    Mammogram: No specific mammographic evidence of malignancy.  Assessment: BIRADS 1.   (07/19/2004)   Mammogram action/deferral: Ordered  (03/12/2009)    DXA bone density scan: Not documented   Smoking status: current  (05/18/2010)   Smoking cessation counseling: yes  (05/18/2010)  Lipids   Total Cholesterol: 200  (03/12/2009)   Lipid panel action/deferral: Lipid Panel ordered   LDL: 145  (03/12/2009)   LDL Direct: Not documented   HDL: 31  (03/12/2009)   Triglycerides: 121  (03/12/2009)    SGOT (AST): 18  (03/12/2009)   SGPT (ALT): 12  (03/12/2009)   Alkaline phosphatase: 119  (03/12/2009)   Total bilirubin: 0.4  (03/12/2009)  Self-Management Support :    Lipid self-management support: Not documented    Nursing Instructions: Provide Hemoccult cards with instructions (see order)   Process Orders Check Orders Results:     [facility]: Check successful Tests Sent for requisitioning (May 18, 2010 4:35 PM):     05/18/2010: [facility] -- T-TSH [phone] (signed)     05/18/2010: [facility] -- T-Folic Acid; RBC [[id number]] (signed)     05/18/2010: [facility] -- T-Vitamin B12 [[id number]] (signed)      05/18/2010: [facility] -- T-Lipid Profile [phone] (signed)     05/18/2010: [facility] -- T-Basic Metabolic Panel [id number] (signed)

## 2010-07-19 NOTE — Progress Notes (Signed)
Summary: pain clinic/ hla  Phone Note Call from Patient   Summary of Call: pt calls and states she went to pain clinic BUT left before she saw the dr because she had to wait too long, had to walk to far, had to go up steps to get to the office (i would think there is an elevator). ??? i called dr Trecia Rogers office, pt did arrive, left and they will not reschedule Initial call taken by: Marin Roberts RN,  April 22, 2010 3:26 PM    Noted.

## 2010-07-19 NOTE — Progress Notes (Signed)
Summary: Refill/gh  Phone Note Refill Request Message from:  Fax from Pharmacy on September 29, 2009 3:16 PM  Refills Requested: Medication #1:  VICODIN 5-500 MG TABS Take 1 tablet by mouth every 12 hours as needed for pain.   Last Refilled: 09/03/2009  Method Requested: Electronic Initial call taken by: Angelina Ok RN,  September 29, 2009 3:17 PM     Prescriptions: VICODIN 5-500 MG TABS (HYDROCODONE-ACETAMINOPHEN) Take 1 tablet by mouth every 12 hours as needed for pain  #30 x 1   Entered and Authorized by:   Laren Everts MD   Signed by:   Laren Everts MD on 09/29/2009   Method used:   Telephoned to ...       [facility] [address] [phone]* (retail)       [address]       [place], [place]  [address]       Ph: [phone] or [phone]       Fax: [phone]   RxID:   [id number]

## 2010-07-19 NOTE — Progress Notes (Signed)
  Phone Note Outgoing Call   Call placed by: Theotis Barrio NT II,  March 25, 2010 2:36 PM Call placed to: Patient Details for Reason: PAN [facility]IC APPT Summary of Call: CALLED PATIENT AND GAVE HER THE APPT FOR THE PAIN CLINIC./   11-4-011 @ 11:00AM. PATIENT IS AWARE OF THIS APPT.  THE OFFICE OF THE [facility] WILL BE MAILING PATIENT THIS INFOMATION.

## 2010-07-19 NOTE — Assessment & Plan Note (Signed)
Summary: pain in legs/pcp-[facility]/hla   Vital Signs:  Patient profile:   65 year old female Height:      67.50 inches Weight:      188.3 pounds BMI:     29.16 Temp:     96.8 degrees F oral Pulse rate:   79 / minute BP sitting:   146 / 90  (right arm)  Vitals Entered By: Filomena Jungling NT II (January 27, 2010 4:37 PM) CC: LEFT FOOT PAIN, Depression Is Patient Diabetic? No Pain Assessment Patient in pain? yes     Location: left foot Intensity: 7 Type: aching Onset of pain  Constant Nutritional Status BMI of 25 - 29 = overweight  Have you ever been in a relationship where you felt threatened, hurt or afraid?No   Does patient need assistance? Functional Status Self care Ambulation Normal   Primary Care Provider:  Laren Everts MD  CC:  LEFT FOOT PAIN and Depression.  History of Present Illness: 65 yr old woman with pmhx as described below comes to the clinic for follow up. Patient reports that depression symptoms have not gotten any better. No HSI/SSI.   Patient complains of pain down left leg described as muscles spasm, crampy feeling that last for 5-10 minutes. Patient gets pain 3-4 times per day. Not alleviated with anything. No new weakness, bladder or bowel incontinence.  Depression History:      The patient denies a depressed mood most of the day and a diminished interest in her usual daily activities.         Preventive Screening-Counseling & Management  Alcohol-Tobacco     Alcohol type: on holidays     Smoking Status: current     Smoking Cessation Counseling: yes     Packs/Day: 3-4 per day  Caffeine-Diet-Exercise     Caffeine use/day: 3     Does Patient Exercise: yes     Type of exercise: WALKING     Exercise (avg: min/session): 30-60     Times/week: 1  Problems Prior to Update: 1)  Depression  (ICD-311) 2)  External Otitis  (ICD-380.10) 3)  Back Pain With Radiculopathy  (ICD-729.2) 4)  Preventive Health Care  (ICD-V70.0) 5)  Peripheral  Neuropathy, Lower Extremity, Left  (ICD-356.9) 6)  Tobacco Abuse  (ICD-305.1) 7)  Screening For Thyroid Disorder  (ICD-V77.0) 8)  Screening For Iron Deficiency Anemia  (ICD-V78.0) 9)  Screening For Diabetes Mellitus  (ICD-V77.1) 10)  Uri  (ICD-465.9) 11)  Allergic Rhinitis  (ICD-477.9) 12)  Carpal Tunnel Syndrome, Left  (ICD-354.0) 13)  Family History Diabetes 1st Degree Relative  (ICD-V18.0)  Medications Prior to Update: 1)  Lyrica 100 Mg Caps (Pregabalin) .... Take 1 Tablet By Mouth Three Times A Day 2)  Vicodin 5-500 Mg Tabs (Hydrocodone-Acetaminophen) .... Take 1 Tablet By Mouth Every 12 Hours As Needed For Pain 3)  Flexeril 10 Mg Tabs (Cyclobenzaprine Hcl) .... Take 1 Tablet By Mouth Three Times A Day For 2-3 Weeks 4)  Amoxicillin 500 Mg Caps (Amoxicillin) .... Take 1 Tablet By Mouth Two Times A Day 5)  Celexa 20 Mg Tabs (Citalopram Hydrobromide) .... Take 1 Tablet By Mouth Once A Day  Current Medications (verified): 1)  Lyrica 100 Mg Caps (Pregabalin) .... Take 1 Tablet By Mouth Three Times A Day 2)  Vicodin 5-500 Mg Tabs (Hydrocodone-Acetaminophen) .... Take 1 Tablet By Mouth Every 12 Hours As Needed For Pain 3)  Flexeril 10 Mg Tabs (Cyclobenzaprine Hcl) .... Take 1 Tablet By Mouth Three Times A  Day For 2-3 Weeks 4)  Amoxicillin 500 Mg Caps (Amoxicillin) .... Take 1 Tablet By Mouth Two Times A Day 5)  Celexa 20 Mg Tabs (Citalopram Hydrobromide) .... Take 1 Tablet By Mouth Once A Day  Allergies: No Known Drug Allergies  Past History:  Past Medical History: Last updated: 03/23/2008 MEDIASTINAL AND BIHILAR LYMPHADNP    - doc'd on chest CT scan 12/15/2005 (scan was done b/c of a ? mass on pre-op CXR - no mass on CT) TOBACCO ABUSE    - emphysematous changes on chest CT scan 12/15/2005 s/p lumbar MRI by Dr. Ophelia Charter (?renal cyst)  Past Surgical History: Last updated: 01/23/2008 CERVICAL SPONDYLOSIS    - s/p C3-4, C4-5 diskectomy, fusion, plating and allograft (12/15/2005 by Dr.  Annell Greening)    - s/p posterior C3-5 fusion with wiring (02/11/2007 by Dr. Annell Greening) - had pseudoarthrosis lumbar surgery 2000  Family History: Last updated: 09/12/2007 Family History Diabetes 1st degree relative - father Family History Hypertension - father  Social History: Last updated: 11/25/2009 No children. Mother recently died in Sep 12, 2022.  Current Smoker (5 cigarettes per day) Alcohol use-yes (social during the holidays) Drug use-no  Risk Factors: Caffeine Use: 3 (01/27/2010) Exercise: yes (01/27/2010)  Risk Factors: Smoking Status: current (01/27/2010) Packs/Day: 3-4 per day (01/27/2010)  Family History: Reviewed history from 09/12/2007 and no changes required. Family History Diabetes 1st degree relative - father Family History Hypertension - father  Social History: Reviewed history from 11/25/2009 and no changes required. No children. Mother recently died in 09-12-22.  Current Smoker (5 cigarettes per day) Alcohol use-yes (social during the holidays) Drug use-no  Review of Systems  The patient denies fever, chest pain, peripheral edema, prolonged cough, headaches, hemoptysis, abdominal pain, melena, hematochezia, and hematuria.    Physical Exam  General:  NAD Mouth:  MMM Neck:  No deformities, masses, or tenderness noted. Lungs:  Normal respiratory effort, chest expands symmetrically. Lungs are clear to auscultation, no crackles or wheezes. Heart:  Normal rate and regular rhythm. S1 and S2 normal without gallop, murmur, click, rub or other extra sounds. Abdomen:  soft, non-tender, and normal bowel sounds.   Msk:  no joint swelling, no joint warmth, and no redness over joints.   Extremities:  no edema Neurologic:  alert & oriented X3, cranial nerves II-XII intact, and gait normal.  Has decreased sensation on left leg. strength normal in all extremities.   Psych:  depressed affect    Impression & Recommendations:  Problem # 1:  DEPRESSION (ICD-311) There is  large component of griefing as patient's mother died a couple of months ago. Continue current regimen and visit with psychologist. No SSI/HSI.  Her updated medication list for this problem includes:    Celexa 20 Mg Tabs (Citalopram hydrobromide) .Marland Kitchen... Take 1 tablet by mouth once a day  Problem # 2:  PERIPHERAL NEUROPATHY, LOWER EXTREMITY, LEFT (ICD-356.9) Lyrica not helping. Instructed to titrate medication as directed to discontinue. On follow up will consider starting her on neurontin.   Problem # 3:  PREVENTIVE HEALTH CARE (ICD-V70.0) Patient instructed to schedule  Mammogram. Will follow up.  Problem # 4:  BACK PAIN WITH RADICULOPATHY (ICD-729.2) Continue current pain regimen.  Problem # 5:  TOBACCO ABUSE (ICD-305.1) Encouraged smoking cessation.   Complete Medication List: 1)  Lyrica 50 Mg Caps (Pregabalin) .... Take one tablet by mouth daily for one week, then 1/2 tablet by mounth for one week, then stop 2)  Vicodin 5-500 Mg Tabs (Hydrocodone-acetaminophen) .... Take  1 tablet by mouth every 12 hours as needed for pain 3)  Flexeril 10 Mg Tabs (Cyclobenzaprine hcl) .... Take 1 tablet by mouth three times a day for 2-3 weeks 4)  Celexa 20 Mg Tabs (Citalopram hydrobromide) .... Take 1 tablet by mouth once a day  Patient Instructions: 1)  Please schedule a follow-up appointment in 3 months. 2)  Take all medication as directed. Prescriptions: VICODIN 5-500 MG TABS (HYDROCODONE-ACETAMINOPHEN) Take 1 tablet by mouth every 12 hours as needed for pain  #30 x 1   Entered and Authorized by:   Laren Everts MD   Signed by:   Laren Everts MD on 01/27/2010   Method used:   Print then Give to Patient   RxID:   [id number]     Prevention & Chronic Care Immunizations   Influenza vaccine: Fluvax MCR  (05/11/2009)    Tetanus booster: Not documented    Pneumococcal vaccine: Not documented    H. zoster vaccine: Not documented  Colorectal Screening   Hemoccult:  Not documented    Colonoscopy: Not documented   Colonoscopy action/deferral: GI referral  (03/12/2009)  Other Screening   Pap smear: Not documented    Mammogram: No specific mammographic evidence of malignancy.  Assessment: BIRADS 1.   (07/19/2004)   Mammogram action/deferral: Ordered  (03/12/2009)    DXA bone density scan: Not documented   Smoking status: current  (01/27/2010)   Smoking cessation counseling: yes  (01/27/2010)  Lipids   Total Cholesterol: 200  (03/12/2009)   Lipid panel action/deferral: Lipid Panel ordered   LDL: 145  (03/12/2009)   LDL Direct: Not documented   HDL: 31  (03/12/2009)   Triglycerides: 121  (03/12/2009)

## 2010-07-19 NOTE — Progress Notes (Signed)
Summary: refill/gg  Phone Note Refill Request  on April 26, 2010 4:54 PM  Refills Requested: Medication #1:  VICODIN 5-500 MG TABS Take 1 tablet by mouth every 12 hours as needed for pain   Last Refilled: 04/14/2010 # 30   Method Requested: Fax to Local Pharmacy Initial call taken by: Merrie Roof RN,  April 26, 2010 4:54 PM    Denied until 05/05/2010.

## 2010-07-19 NOTE — Consult Note (Signed)
Summary: [facility]  [facility]   Imported By: Louretta Parma 01/13/2010 14:22:19  _____________________________________________________________________  External Attachment:    Type:   Image     Comment:   External Document

## 2010-07-19 NOTE — Progress Notes (Signed)
Summary: phone/gg  Phone Note Call from Patient   Caller: Patient Summary of Call: Pt called asking for a referral for a specialist to address pain to legs from nerve damage since surgery. Pt # [id number] Initial call taken by: Merrie Roof RN,  March 11, 2010 11:10 AM  Follow-up for Phone Call        Pt called again with c/o pain in left leg and foot. Pt requesting pain pills.  Feet and leg hurt like tooth ache. Pt # [id number]    Will refer to Pain clinic.

## 2010-07-19 NOTE — Progress Notes (Signed)
Summary: ear/ hla  Phone Note Call from Patient   Summary of Call: R ear has been hurting x 1 month, feels like something is moving in the ear, it started to drain clear liq friday and pain has increased. request appt. states nothing helps the pain but lying on the R side does make the pain worse. also describes swelling at the lobe and canal, states she cannot get a qtip in ear canal.  spoke w/ dr Algis Liming, will add to dr boggala's schedule but after she arrives will decide where she will be seen, [facility] or [facility] Initial call taken by: Marin Roberts RN,  Oct 25, 2009 2:41 PM

## 2010-07-19 NOTE — Progress Notes (Signed)
Summary: PREVENTIVE COLONOSCOPY  Phone Note Outgoing Call   Summary of Call: Patient was sch for a Colonoscopy screening  on Mar 23, 2009.  A no Show letter was sent to the patient from Dr. Christella Hartigan office.  Patient needs to be resch again.  Checked patient's chart for current insurance information.  Patient has Medicare and Medicaid listed as of 2010. Initial call taken by: Shon Hough,  May 18, 2010 9:17 AM

## 2010-07-19 NOTE — Assessment & Plan Note (Signed)
Summary: see note/pcp-vega/hla   Vital Signs:  Patient profile:   65 year old female Height:      67.50 inches (171.45 cm) Weight:      186.9 pounds (84.95 kg) BMI:     28.94 Temp:     97.0 degrees F (36.11 degrees C) oral Pulse rate:   62 / minute BP sitting:   139 / 94  (right arm) Cuff size:   regular  Vitals Entered By: Theotis Barrio NT II (Oct 25, 2009 2:44 PM) CC: right ear pain / ongoing ear problem for about a month Is Patient Diabetic? No Pain Assessment Patient in pain? yes     Location: right ear Intensity:     6 Onset of pain  on going for about a month/ been self monitoring   Have you ever been in a relationship where you felt threatened, hurt or afraid?No   Does patient need assistance? Functional Status Self care Ambulation Normal   Primary Care Provider:  Laren Everts MD  CC:  right ear pain / ongoing ear problem for about a month.  History of Present Illness: 65 yr old lady with PMH as mnetioned below comes to the office for complains of right ear pain. Her complaints started in march 2011 and have been gradually getting worse. She complains of itching, andmild pain. She denies any difficulty hearing, dischrge from the ear. She denies any fever, chills, body pains or any other systemic complaints.  Preventive Screening-Counseling & Management  Alcohol-Tobacco     Alcohol type: on holidays     Smoking Status: current     Smoking Cessation Counseling: yes     Packs/Day: 3-4 per day  Caffeine-Diet-Exercise     Caffeine use/day: 3     Does Patient Exercise: yes     Type of exercise: WALKING     Exercise (avg: min/session): 30-60     Times/week: 1  Problems Prior to Update: 1)  Back Pain With Radiculopathy  (ICD-729.2) 2)  Preventive Health Care  (ICD-V70.0) 3)  Peripheral Neuropathy, Lower Extremity, Left  (ICD-356.9) 4)  Tobacco Abuse  (ICD-305.1) 5)  Screening For Thyroid Disorder  (ICD-V77.0) 6)  Screening For Iron Deficiency  Anemia  (ICD-V78.0) 7)  Screening For Diabetes Mellitus  (ICD-V77.1) 8)  Uri  (ICD-465.9) 9)  Allergic Rhinitis  (ICD-477.9) 10)  Carpal Tunnel Syndrome, Left  (ICD-354.0) 11)  Family History Diabetes 1st Degree Relative  (ICD-V18.0)  Medications Prior to Update: 1)  Lyrica 50 Mg Caps (Pregabalin) .... Take 1 Tablet By Mouth Three Times A Day 2)  Vicodin 5-500 Mg Tabs (Hydrocodone-Acetaminophen) .... Take 1 Tablet By Mouth Every 12 Hours As Needed For Pain 3)  Flexeril 10 Mg Tabs (Cyclobenzaprine Hcl) .... Take 1 Tablet By Mouth Three Times A Day For 2-3 Weeks  Current Medications (verified): 1)  Lyrica 50 Mg Caps (Pregabalin) .... Take 1 Tablet By Mouth Three Times A Day 2)  Vicodin 5-500 Mg Tabs (Hydrocodone-Acetaminophen) .... Take 1 Tablet By Mouth Every 12 Hours As Needed For Pain 3)  Flexeril 10 Mg Tabs (Cyclobenzaprine Hcl) .... Take 1 Tablet By Mouth Three Times A Day For 2-3 Weeks  Allergies (verified): No Known Drug Allergies  Past History:  Family History: Last updated: 09/12/2007 Family History Diabetes 1st degree relative - father Family History Hypertension - father  Social History: Last updated: 01/23/2008 No children. Lives with her mother who has Alzheimer's dementia and a psychotic disorder. Current Smoker (5 cigarettes per day) Alcohol  use-yes (social during the holidays) Drug use-no  Risk Factors: Caffeine Use: 3 (10/25/2009) Exercise: yes (10/25/2009)  Risk Factors: Smoking Status: current (10/25/2009) Packs/Day: 3-4 per day (10/25/2009)  Past Medical History: Reviewed history from 03/23/2008 and no changes required. MEDIASTINAL AND BIHILAR LYMPHADNP    - doc'd on chest CT scan 12/15/2005 (scan was done b/c of a ? mass on pre-op CXR - no mass on CT) TOBACCO ABUSE    - emphysematous changes on chest CT scan 12/15/2005 s/p lumbar MRI by Dr. Ophelia Charter (?renal cyst)  Past Surgical History: Reviewed history from 01/23/2008 and no changes  required. CERVICAL SPONDYLOSIS    - s/p C3-4, C4-5 diskectomy, fusion, plating and allograft (12/15/2005 by Dr. Annell Greening)    - s/p posterior C3-5 fusion with wiring (02/11/2007 by Dr. Annell Greening) - had pseudoarthrosis lumbar surgery 2000  Family History: Reviewed history from 09/12/2007 and no changes required. Family History Diabetes 1st degree relative - father Family History Hypertension - father  Social History: Reviewed history from 01/23/2008 and no changes required. No children. Lives with her mother who has Alzheimer's dementia and a psychotic disorder. Current Smoker (5 cigarettes per day) Alcohol use-yes (social during the holidays) Drug use-no  Review of Systems      See HPI  Physical Exam  General:  NAD Ears:  Right external ear, few excoriations, with mild cellulitic changes. There is no visible discharge, abscess. Ear canal looks clean Mouth:  MMM Neck:  No deformities, masses, or tenderness noted. Lungs:  Normal respiratory effort, chest expands symmetrically. Lungs are clear to auscultation, no crackles or wheezes. Heart:  Normal rate and regular rhythm. S1 and S2 normal without gallop, murmur, click, rub or other extra sounds. Abdomen:  soft, non-tender, and normal bowel sounds.   Pulses:  R radial normal.   Extremities:  no edema Neurologic:  alert & oriented X3, cranial nerves II-XII intact, and gait normal.     Impression & Recommendations:  Problem # 1:  EXTERNAL OTITIS (ICD-380.10)  Mild otitis externa especially involving the tragus of the external ear.Will empirically treat with ciprofloxacin and check CBC, HbA1C. Will refer to ENT a spatient is requesting.  Orders: T- Capillary Blood Glucose (16109) T-Hgb A1C (in-house) ([id number]) T-CBC w/Diff ([id number])  Complete Medication List: 1)  Lyrica 50 Mg Caps (Pregabalin) .... Take 1 tablet by mouth three times a day 2)  Vicodin 5-500 Mg Tabs (Hydrocodone-acetaminophen) .... Take 1 tablet by mouth  every 12 hours as needed for pain 3)  Flexeril 10 Mg Tabs (Cyclobenzaprine hcl) .... Take 1 tablet by mouth three times a day for 2-3 weeks 4)  Ciprofloxacin Hcl 750 Mg Tabs (Ciprofloxacin hcl) .... Take 1 tablet by mouth two times a day for 7 days.  Patient Instructions: 1)  Follow up with your doctor in june. 2)  Take the antibiotics as recommended. Prescriptions: CIPROFLOXACIN HCL 750 MG TABS (CIPROFLOXACIN HCL) Take 1 tablet by mouth two times a day for 7 days.  #14 x 0   Entered and Authorized by:   Blondell Reveal MD   Signed by:   Blondell Reveal MD on 10/25/2009   Method used:   [facility] to        [facility] [address] [phone]* (retail)       [address]       [place], [place]  [address]       Ph: [phone] or [phone]  Fax: [phone]   RxID:   [id number]  Process Orders Check Orders Results:     [facility]: Check successful Tests Sent for requisitioning (Oct 25, 2009 2:58 PM):     10/25/2009: [facility] -- [facility] w/Diff [[id number]] (signed)    Prevention & Chronic Care Immunizations   Influenza vaccine: Fluvax MCR  (05/11/2009)    Tetanus booster: Not documented    Pneumococcal vaccine: Not documented    H. zoster vaccine: Not documented  Colorectal Screening   Hemoccult: Not documented    Colonoscopy: Not documented   Colonoscopy action/deferral: GI referral  (03/12/2009)  Other Screening   Pap smear: Not documented    Mammogram: No specific mammographic evidence of malignancy.  Assessment: BIRADS 1.   (07/19/2004)   Mammogram action/deferral: Ordered  (03/12/2009)    DXA bone density scan: Not documented   Smoking status: current  (10/25/2009)   Smoking cessation counseling: yes  (10/25/2009)  Lipids   Total Cholesterol: 200  (03/12/2009)   Lipid panel action/deferral: Lipid Panel ordered   LDL: 145  (03/12/2009)   LDL Direct: Not documented   HDL: 31  (03/12/2009)    Triglycerides: 121  (03/12/2009)  Appended Document: Lab Order    Lab Visit  Laboratory Results   Blood Tests   Date/Time Received: Oct 25, 2009 3:26 PM Date/Time Reported: Alric Quan  Oct 25, 2009 3:26 PM   HGBA1C: 5.5%   (Normal Range: Non-Diabetic - 3-6%   Control Diabetic - 6-8%) CBG Random:: 91mg /dL    Orders Today:

## 2010-07-19 NOTE — Progress Notes (Signed)
Summary: refill/gg  Phone Note Refill Request  on December 29, 2009 10:02 AM  Refills Requested: Medication #1:  VICODIN 5-500 MG TABS Take 1 tablet by mouth every 12 hours as needed for pain pt has scheduled appointment august 11th, this is your first available.  She request pain med to last until then.  She has made several calls about this.     She is seeing Dr Ethelene Hal this saturday for injection to control pain   Method Requested: Telephone to Pharmacy Initial call taken by: Merrie Roof RN,  December 29, 2009 10:05 AM    Prescriptions: VICODIN 5-500 MG TABS (HYDROCODONE-ACETAMINOPHEN) Take 1 tablet by mouth every 12 hours as needed for pain  #30 x 0   Entered and Authorized by:   Laren Everts MD   Signed by:   Laren Everts MD on 12/29/2009   Method used:   Telephoned to ...       [facility] [address] [phone]* (retail)       [address]       [place], [place]  [address]       Ph: [phone] or [phone]       Fax: [phone]   RxID:   [id number]   Appended Document: refill/gg meds called in

## 2010-07-19 NOTE — Progress Notes (Signed)
Summary: med refil/gp  Phone Note Refill Request Message from:  Fax from Pharmacy on March 10, 2010 11:17 AM  Refills Requested: Medication #1:  VICODIN 5-500 MG TABS Take 1 tablet by mouth every 12 hours as needed for pain   Last Refilled: 02/16/2010 Last appt. 01/27/10.   Method Requested: Telephone to Pharmacy Initial call taken by: Chinita Pester RN,  March 10, 2010 11:17 AM    Will be refilled on 9/31/11.  [facility]  refill until 9/31 per MD.

## 2010-07-19 NOTE — Progress Notes (Signed)
Summary: refill/ hla  Phone Note Refill Request Message from:  Patient on Oct 25, 2009 3:09 PM  Refills Requested: Medication #1:  VICODIN 5-500 MG TABS Take 1 tablet by mouth every 12 hours as needed for pain   Last Refilled: 4/14 Initial call taken by: Marin Roberts RN,  Oct 25, 2009 3:09 PM    Prescriptions: VICODIN 5-500 MG TABS (HYDROCODONE-ACETAMINOPHEN) Take 1 tablet by mouth every 12 hours as needed for pain  #30 x 0   Entered and Authorized by:   Laren Everts MD   Signed by:   Laren Everts MD on 10/25/2009   Method used:   Telephoned to ...       [facility] [address] [phone]* (retail)       [address]       [place], [place]  [address]       Ph: [phone] or [phone]       Fax: [phone]   RxID:   [id number]

## 2010-07-19 NOTE — Progress Notes (Signed)
Summary: lost pain med/ hla  Phone Note Call from Patient   Summary of Call: pt calls to say she picked up her pain med 4/14 pm and lost it before she arrived home and before she took even 1 pill. wants a new script Initial call taken by: Marin Roberts RN,  October 01, 2009 3:21 PM  Follow-up for Phone Call        I will do this reluctantly.  She seems to have a well-documented source of pain.  It is a tiny dose.  I cannot find a record of any prior mis-behavoir. Please call her and say that we cannot ever do this again. Follow-up by: Ulyess Mort MD,  October 01, 2009 4:52 PM    Prescriptions: VICODIN 5-500 MG TABS (HYDROCODONE-ACETAMINOPHEN) Take 1 tablet by mouth every 12 hours as needed for pain  #30 x 0   Entered and Authorized by:   Ulyess Mort MD   Signed by:   Ulyess Mort MD on 10/01/2009   Method used:   Telephoned to ...       [facility] [address] [phone]* (retail)       [address]       [place], [place]  [address]       Ph: [phone] or [phone]       Fax: [phone]   RxID:   [id number]

## 2010-07-19 NOTE — Assessment & Plan Note (Signed)
Summary: SB.   Vital Signs:  Patient profile:   65 year old female Height:      67.50 inches (171.45 cm) Weight:      194.6 pounds (88.45 kg) BMI:     30.14 Temp:     97.0 degrees F (36.11 degrees C) oral Pulse rate:   107 / minute BP sitting:   145 / 87  (right arm) Cuff size:   regular  Vitals Entered By: Theotis Barrio NT II (April 13, 2010 4:12 PM) CC: CHRONIC BILATERAL LEG PAIN   -# 6-7 /  MEDICATION REFILL  Pain Assessment Patient in pain? yes     Location: LEGS Intensity:      6-7 Type: ACHE Onset of pain  Chronic Nutritional Status BMI of 25 - 29 = overweight  Have you ever been in a relationship where you felt threatened, hurt or afraid?No   Does patient need assistance? Functional Status Self care Ambulation Normal   Primary Care Provider:  Laren Everts MD  CC:  CHRONIC BILATERAL LEG PAIN   -# 6-7 /  MEDICATION REFILL .  History of Present Illness: 65 yr old woman with pmhx as described below comes to the clinic for regular check up and management of back pain. Patient titrated herself off lyrica. Has pain radiating down from back. Denies new tingling, weakness, saddle anesthesia, bowel or bladder incontinence.   Complains of calf pain on left leg since returning from road trip. States that she has a big vein in the back of her leg that gets engorged and causes pain. Denies shortness of breath.  Patient took herself off antidepressants. Didn't think she needed medication. Denies feeling depressed.  Continue to smoke.  Has not schedule Mammogram  Depression History:      The patient denies a depressed mood most of the day and a diminished interest in her usual daily activities.         Preventive Screening-Counseling & Management  Alcohol-Tobacco     Alcohol type: on holidays     Smoking Status: current     Smoking Cessation Counseling: yes     Packs/Day: 3-4 per day  Caffeine-Diet-Exercise     Caffeine use/day: 3     Does Patient  Exercise: yes     Type of exercise: WALKING     Exercise (avg: min/session): 30-60     Times/week: 1  Problems Prior to Update: 1)  Depression  (ICD-311) 2)  External Otitis  (ICD-380.10) 3)  Back Pain With Radiculopathy  (ICD-729.2) 4)  Preventive Health Care  (ICD-V70.0) 5)  Peripheral Neuropathy, Lower Extremity, Left  (ICD-356.9) 6)  Tobacco Abuse  (ICD-305.1) 7)  Screening For Thyroid Disorder  (ICD-V77.0) 8)  Screening For Iron Deficiency Anemia  (ICD-V78.0) 9)  Screening For Diabetes Mellitus  (ICD-V77.1) 10)  Uri  (ICD-465.9) 11)  Allergic Rhinitis  (ICD-477.9) 12)  Carpal Tunnel Syndrome, Left  (ICD-354.0) 13)  Family History Diabetes 1st Degree Relative  (ICD-V18.0)  Medications Prior to Update: 1)  Lyrica 50 Mg Caps (Pregabalin) .... Take One Tablet By Mouth Daily For One Week, Then 1/2 Tablet By [facility] For One Week, Then Stop 2)  Vicodin 5-500 Mg Tabs (Hydrocodone-Acetaminophen) .... Take 1 Tablet By Mouth Every 12 Hours As Needed For Pain 3)  Flexeril 10 Mg Tabs (Cyclobenzaprine Hcl) .... Take 1 Tablet By Mouth Three Times A Day For 2-3 Weeks 4)  Celexa 20 Mg Tabs (Citalopram Hydrobromide) .... Take 1 Tablet By Mouth Once A Day  Current Medications (verified): 1)  Vicodin 5-500 Mg Tabs (Hydrocodone-Acetaminophen) .... Take 1 Tablet By Mouth Every 12 Hours As Needed For Pain 2)  Flexeril 10 Mg Tabs (Cyclobenzaprine Hcl) .... Take 1 Tablet By Mouth Three Times A Day For 2-3 Weeks 3)  Neurontin 300 Mg Caps (Gabapentin) .... Take 1 Tablet By Mouth Once A Day On Day 1, Then Two Times A Day On Day 2, Then Three Times A Day  Allergies: No Known Drug Allergies  Past History:  Past Medical History: Last updated: 03/23/2008 MEDIASTINAL AND BIHILAR LYMPHADNP    - doc'd on chest CT scan 12/15/2005 (scan was done b/c of a ? mass on pre-op CXR - no mass on CT) TOBACCO ABUSE    - emphysematous changes on chest CT scan 12/15/2005 s/p lumbar MRI by Dr. Ophelia Charter (?renal cyst)  Past  Surgical History: Last updated: 01/23/2008 CERVICAL SPONDYLOSIS    - s/p C3-4, C4-5 diskectomy, fusion, plating and allograft (12/15/2005 by Dr. Annell Greening)    - s/p posterior C3-5 fusion with wiring (02/11/2007 by Dr. Annell Greening) - had pseudoarthrosis lumbar surgery 2000  Family History: Last updated: 09/12/2007 Family History Diabetes 1st degree relative - father Family History Hypertension - father  Social History: Last updated: 11/25/2009 No children. Mother recently died in 2022/09/05.  Current Smoker (5 cigarettes per day) Alcohol use-yes (social during the holidays) Drug use-no  Risk Factors: Caffeine Use: 3 (04/13/2010) Exercise: yes (04/13/2010)  Risk Factors: Smoking Status: current (04/13/2010) Packs/Day: 3-4 per day (04/13/2010)  Family History: Reviewed history from 09/12/2007 and no changes required. Family History Diabetes 1st degree relative - father Family History Hypertension - father  Social History: Reviewed history from 11/25/2009 and no changes required. No children. Mother recently died in 2022-09-05.  Current Smoker (5 cigarettes per day) Alcohol use-yes (social during the holidays) Drug use-no  Review of Systems  The patient denies fever, chest pain, dyspnea on exertion, hemoptysis, abdominal pain, melena, hematochezia, hematuria, and muscle weakness.    Physical Exam  General:  NAD, vital signs reviewed Head:  normocephalic.   Eyes:  vision grossly intact.   Ears:  no external deformities.   Nose:  no external deformity.   Mouth:  MMM. pharynx pink and moist.   Neck:  No deformities, masses, or tenderness noted. Lungs:  Normal respiratory effort, chest expands symmetrically. Lungs are clear to auscultation, no crackles or wheezes. Heart:  Normal rate and regular rhythm. S1 and S2 normal without gallop, murmur, click, rub or other extra sounds. Abdomen:  soft, non-tender, and normal bowel sounds.   Msk:  no joint swelling, no joint warmth, and no  redness over joints.   Left Calf: mild ttp , no swellling, no warmth, negative Homan's sign Extremities:  no edema or cyanosis Neurologic:  alert & oriented X3, cranial nerves II-XII intact, and gait normal.  Has decreased sensation on left leg. strength normal in all extremities.   Psych:  depressed affect.     Impression & Recommendations:  Problem # 1:  BACK PAIN WITH RADICULOPATHY (ICD-729.2) Started patient on Neurontin. Will follow up. May need to increase medication. Continue vicodin for now. Patient has an appointment with PM&R next week. Will follow up and get records.   Problem # 2:  CALF PAIN, LEFT (ICD-729.5) Will order left lower extremity duplex rule out DVT/superficial thrombophlebitis although unlikely.   Orders: LE Venous Duplex (DVT) (DVT)  Problem # 3:  DEPRESSION (ICD-311) Denies being depressed, or SSI/HSI. Patient looks depressed. Still  grieving mother's death. Monitor closely. Inquire about mood on follow up and consider restarting antidepressant.  The following medications were removed from the medication list:    Celexa 20 Mg Tabs (Citalopram hydrobromide) .Marland Kitchen... Take 1 tablet by mouth once a day  Problem # 4:  PREVENTIVE HEALTH CARE (ICD-V70.0) Needs Mammogram. Will order on follow up. Will inquire about Colonoscopy.  Complete Medication List: 1)  Vicodin 5-500 Mg Tabs (Hydrocodone-acetaminophen) .... Take 1 tablet by mouth every 12 hours as needed for pain 2)  Flexeril 10 Mg Tabs (Cyclobenzaprine hcl) .... Take 1 tablet by mouth three times a day for 2-3 weeks 3)  Neurontin 300 Mg Caps (Gabapentin) .... Take 1 tablet by mouth once a day on day 1, then two times a day on day 2, then three times a day  Patient Instructions: 1)  Please schedule a follow-up appointment in 1 month. 2)  Take all medication as directed. 3)  You will be called with any abnormalities in the tests scheduled or performed today.  If you don't hear from [place] within a week from when the  test was performed, you can assume that your test was normal.  Prescriptions: VICODIN 5-500 MG TABS (HYDROCODONE-ACETAMINOPHEN) Take 1 tablet by mouth every 12 hours as needed for pain  #30 x 0   Entered and Authorized by:   Laren Everts MD   Signed by:   Laren Everts MD on 04/14/2010   Method used:   Telephoned to ...       [facility] [address] [phone]* (retail)       [address]       [place], [place]  [address]       Ph: [phone] or [phone]       Fax: [phone]   RxID:   [id number] NEURONTIN 300 MG CAPS (GABAPENTIN) Take 1 tablet by mouth once a day on Day 1, then two times a day on Day 2, then three times a day  #90 x 1   Entered and Authorized by:   Laren Everts MD   Signed by:   Laren Everts MD on 04/13/2010   Method used:   [facility]        [facility] [address] [phone]* (retail)       [address]       [place], [place]  [address]       Ph: [phone] or [phone]       Fax: [phone]   RxID:   [id number]  [facility]; pt. awared.   Chinita Pester RN  April 14, 2010 11:45 AM   Orders Added: 1)  LE Venous Duplex (DVT) [DVT] 2)  Est. Patient Level IV [99214]     Prevention & Chronic Care Immunizations   Influenza vaccine: Fluvax MCR  (05/11/2009)    Tetanus booster: Not documented    Pneumococcal vaccine: Not documented    H. zoster vaccine: Not documented  Colorectal Screening   Hemoccult: Not documented    Colonoscopy: Not documented   Colonoscopy action/deferral: GI referral  (03/12/2009)  Other Screening   Pap smear: Not documented    Mammogram: No specific mammographic evidence of malignancy.  Assessment: BIRADS 1.   (07/19/2004)   Mammogram action/deferral: Ordered  (03/12/2009)    DXA bone density scan: Not documented   Smoking status: current  (04/13/2010)   Smoking  cessation counseling: yes   (04/13/2010)  Lipids   Total Cholesterol: 200  (03/12/2009)   Lipid panel action/deferral: Lipid Panel ordered   LDL: 145  (03/12/2009)   LDL Direct: Not documented   HDL: 31  (03/12/2009)   Triglycerides: 121  (03/12/2009)

## 2010-07-19 NOTE — Progress Notes (Signed)
Summary: Refill  Phone Note Outgoing Call   Call placed by: Angelina Ok RN,  February 22, 2010 2:35 PM Call placed to: Patient Summary of Call: Call to pt to inform her that she will need to come in for an appointment before she will be given an Antibiotic.  Message left for pt to call the Clinics. Angelina Ok RN  February 22, 2010 2:36 PM  Initial call taken by: Angelina Ok RN,  February 22, 2010 2:36 PM

## 2010-07-19 NOTE — Progress Notes (Signed)
Summary: refill pain med/ hla  Phone Note Call from Patient   Caller: Patient Summary of Call: pt states she is taking the lyrica 50mg  and it is not helping but when she takes 100mg  it makes her dizzy. she cannot see you until mid august and needs something for pain. Initial call taken by: Marin Roberts RN,  December 27, 2009 4:33 PM    Patient can take Lyrica 75mg . Take one and 1/2 tablet of 50mg  Lyrica by mouth three times a day. Thank you.

## 2010-07-21 NOTE — Progress Notes (Addendum)
  Phone Note Call from Patient   Caller: Patient Call For: Laren Everts MD Summary of Call: Call from pt would like to know about scheduling her Colonoscopy, Mammogram and Pap Smear.  Pt has an appointment on 06/23/2010. Initial call taken by: Angelina Ok RN,  May 30, 2010 4:49 PM

## 2010-07-21 NOTE — Progress Notes (Addendum)
Summary: Refill/gh  Phone Note Refill Request Message from:  Patient on July 06, 2010 3:58 PM  Refills Requested: Medication #1:  VICODIN 5-500 MG TABS Take 1 tablet by mouth every 12 hours as needed for pain Last office vist was 06/03/2010.  Last labs were 05/18/2010.   Method Requested: Electronic Initial call taken by: Angelina Ok RN,  July 06, 2010 3:58 PM    Prescriptions: VICODIN 5-500 MG TABS (HYDROCODONE-ACETAMINOPHEN) Take 1 tablet by mouth every 12 hours as needed for pain  #60 x 3   Entered by:   Donia Guiles MD   Authorized by:   Marland Kitchen [facility] ATTENDING DESKTOP   Signed by:   Donia Guiles MD on 07/06/2010   Method used:   Telephoned to ...       [facility] [address] [phone]* (retail)       [address]       [place], [place]  [address]       Ph: [phone] or [phone]       Fax: [phone]   RxID:   [id number]   \                                   +  Appended Document: Refill/gh Prescription for Vicodin 5-500 mg #60 with 3 [facility] [address] [facility] order of Dr. Reche Dixon.  Angelina Ok, RN  July 06, 2010 5:30 PM

## 2010-07-21 NOTE — Progress Notes (Signed)
Summary: REfill/gh  Phone Note Refill Request Message from:  Fax from Pharmacy on June 14, 2010 6:02 PM  Refills Requested: Medication #1:  NEURONTIN 300 MG CAPS Take 2 tablets by mouth three times a day   Last Refilled: 05/11/2010 Last seen 06/03/2010.  Last labs were 05/18/2010.   Method Requested: Electronic Initial call taken by: Angelina Ok RN,  June 14, 2010 6:02 PM  Follow-up for Phone Call        Refill approved-nurse to complete- Will increase dosage of pill to reduce pill count Follow-up by: Julaine Fusi  DO,  June 14, 2010 9:45 PM    New/Updated Medications: GABAPENTIN 600 MG TABS (GABAPENTIN) Take 1 tablet by mouth three times a day Prescriptions: GABAPENTIN 600 MG TABS (GABAPENTIN) Take 1 tablet by mouth three times a day  #90 x 3   Entered and Authorized by:   Julaine Fusi  DO   Signed by:   Julaine Fusi  DO on 06/14/2010   Method used:   [facility]        [facility]  [address] [phone]* (retail)       [address]       [place], [place]  [address]       Ph: [phone] or [phone]       Fax: [phone]   RxID:   [id number]

## 2010-07-21 NOTE — Assessment & Plan Note (Signed)
Summary: RASH ON BODY/SB.   Vital Signs:  Patient profile:   65 year old female Height:      67.50 inches Weight:      200.6 pounds BMI:     31.07 Temp:     97.5 degrees F oral Pulse rate:   86 / minute BP sitting:   132 / 82  (right arm)  Vitals Entered By: Filomena Jungling NT II (June 03, 2010 10:37 AM) CC: RASH X 1 MONTH, Depression Is Patient Diabetic? No Pain Assessment Patient in pain? no      Nutritional Status BMI of > 30 = obese  Have you ever been in a relationship where you felt threatened, hurt or afraid?No   Does patient need assistance? Functional Status Self care Ambulation Normal   Primary Care Provider:  Laren Everts MD  CC:  RASH X 1 MONTH and Depression.  History of Present Illness: Pt is a very pleasant 65 y/o woman with pmh outlined below presenting with one month h/o of left groin itching. She states that the itching got worse after getting an [place] that was done to r/o dvt. The gel was applied to the left groin and since then the itching had gotten worse, however she notes that she was having a bit of itching before then.  She never noticed any well formed lesions or rashes in the area, no erythema, drainage or warmth. It is also painless. She also denied having any fevers, chills, sob, cp, abd pain, dysuria, hematuria, frequency, and no other rashes present anywhere else. This has never happened in the past, no new meds recently started, no new detergents, soaps or perfumes.  Depression History:      The patient denies a depressed mood most of the day and a diminished interest in her usual daily activities.         Preventive Screening-Counseling & Management  Alcohol-Tobacco     Alcohol type: on holidays     Smoking Status: current     Smoking Cessation Counseling: yes     Packs/Day: 3-4 per day  Caffeine-Diet-Exercise     Caffeine use/day: 3     Does Patient Exercise: yes     Type of exercise: WALKING     Exercise (avg:  min/session): 30-60     Times/week: 1  Current Problems (verified): 1)  Hyperlipidemia  (ICD-272.4) 2)  Depression  (ICD-311) 3)  External Otitis  (ICD-380.10) 4)  Back Pain With Radiculopathy  (ICD-729.2) 5)  Preventive Health Care  (ICD-V70.0) 6)  Peripheral Neuropathy, Lower Extremity, Left  (ICD-356.9) 7)  Tobacco Abuse  (ICD-305.1) 8)  Screening For Thyroid Disorder  (ICD-V77.0) 9)  Screening For Iron Deficiency Anemia  (ICD-V78.0) 10)  Screening For Diabetes Mellitus  (ICD-V77.1) 11)  Uri  (ICD-465.9) 12)  Allergic Rhinitis  (ICD-477.9) 13)  Carpal Tunnel Syndrome, Left  (ICD-354.0) 14)  Family History Diabetes 1st Degree Relative  (ICD-V18.0)  Current Medications (verified): 1)  Vicodin 5-500 Mg Tabs (Hydrocodone-Acetaminophen) .... Take 1 Tablet By Mouth Every 12 Hours As Needed For Pain 2)  Flexeril 10 Mg Tabs (Cyclobenzaprine Hcl) .... Take 1 Tablet By Mouth Three Times A Day For 2-3 Weeks 3)  Neurontin 300 Mg Caps (Gabapentin) .... Take 2 Tablets By Mouth Three Times A Day 4)  Micatin 2 % Crea (Miconazole Nitrate) .... Apply To Affected Area Two Times A Day For 2 Weeks  Allergies (verified): No Known Drug Allergies  Past History:  Past Medical History: Last updated: 03/23/2008  MEDIASTINAL AND BIHILAR LYMPHADNP    - doc'd on chest CT scan 12/15/2005 (scan was done b/c of a ? mass on pre-op CXR - no mass on CT) TOBACCO ABUSE    - emphysematous changes on chest CT scan 12/15/2005 s/p lumbar MRI by Dr. Ophelia Charter (?renal cyst)  Past Surgical History: Last updated: 01/23/2008 CERVICAL SPONDYLOSIS    - s/p C3-4, C4-5 diskectomy, fusion, plating and allograft (12/15/2005 by Dr. Annell Greening)    - s/p posterior C3-5 fusion with wiring (02/11/2007 by Dr. Annell Greening) - had pseudoarthrosis lumbar surgery 2000  Family History: Last updated: 09/12/2007 Family History Diabetes 1st degree relative - father Family History Hypertension - father  Social History: Last updated:  11/25/2009 No children. Mother recently died in September 11, 2022.  Current Smoker (5 cigarettes per day) Alcohol use-yes (social during the holidays) Drug use-no  Risk Factors: Caffeine Use: 3 (06/03/2010) Exercise: yes (06/03/2010)  Risk Factors: Smoking Status: current (06/03/2010) Packs/Day: 3-4 per day (06/03/2010)  Review of Systems      See HPI  Physical Exam  General:  alert and well-developed.   Lungs:  normal respiratory effort and normal breath sounds.   Heart:  normal rate and regular rhythm.   Abdomen:  soft, non-tender, normal bowel sounds, no distention, and no masses.   Pulses:  normal peripheral pulses Extremities:  no cyanosis or edema Neurologic:  alert & oriented X3.   Skin:  left groin - there's a roughly circular lesion with a raised border just left of the pubic area. It is dark purple in color with a greyish shin, dry and non tender to palpation. There are no ulcerations, edema or drainage.    Impression & Recommendations:  Problem # 1:  PRURITUS (ICD-698.9) Likely 2/2 cutaneous candidiasis vs contact dermatitis. Favor fungal infection given its raised borders, however cannot ruleout allergic reaction. So will treat with topical miconazole for now. Pt instructed to call the clinic if this does not improve in one week, and at that time we can just phone in a prescription for a topical steroid instead.  Complete Medication List: 1)  Vicodin 5-500 Mg Tabs (Hydrocodone-acetaminophen) .... Take 1 tablet by mouth every 12 hours as needed for pain 2)  Flexeril 10 Mg Tabs (Cyclobenzaprine hcl) .... Take 1 tablet by mouth three times a day for 2-3 weeks 3)  Neurontin 300 Mg Caps (Gabapentin) .... Take 2 tablets by mouth three times a day 4)  Micatin 2 % Crea (Miconazole nitrate) .... Apply to affected area two times a day for 2 weeks  Patient Instructions: 1)  If your symptoms do not start improving after one week of applying the cream, pls call the clinic. 2)  Followup  with your PCP, Dr Cena Benton at next available appointment. Prescriptions: MICATIN 2 % CREA (MICONAZOLE NITRATE) apply to affected area two times a day for 2 weeks  #1 x 0   Entered and Authorized by:   Jaci Lazier MD   Signed by:   Jaci Lazier MD on 06/03/2010   Method used:   Print then Give to Patient   RxID:   [id number]    Orders Added: 1)  Est. Patient Level II [14782]    Prevention & Chronic Care Immunizations   Influenza vaccine: Fluvax MCR  (05/11/2009)   Influenza vaccine deferral: Refused  (05/18/2010)    Tetanus booster: Not documented    Pneumococcal vaccine: Not documented    H. zoster vaccine: Not documented  Colorectal Screening   Hemoccult: Not  documented   Hemoccult action/deferral: Ordered  (05/18/2010)    Colonoscopy: Not documented   Colonoscopy action/deferral: GI referral  (03/12/2009)  Other Screening   Pap smear: Not documented    Mammogram: No specific mammographic evidence of malignancy.  Assessment: BIRADS 1.   (07/19/2004)   Mammogram action/deferral: Ordered  (03/12/2009)    DXA bone density scan: Not documented   Smoking status: current  (06/03/2010)   Smoking cessation counseling: yes  (06/03/2010)  Lipids   Total Cholesterol: 198  (05/18/2010)   Lipid panel action/deferral: Lipid Panel ordered   LDL: 139  (05/18/2010)   LDL Direct: Not documented   HDL: 35  (05/18/2010)   Triglycerides: 120  (05/18/2010)    SGOT (AST): 18  (03/12/2009)   SGPT (ALT): 12  (03/12/2009)   Alkaline phosphatase: 119  (03/12/2009)   Total bilirubin: 0.4  (03/12/2009)  Self-Management Support :    Lipid self-management support: Not documented

## 2010-07-25 ENCOUNTER — Ambulatory Visit (HOSPITAL_COMMUNITY): Admission: RE | Admit: 2010-07-25 | Payer: Self-pay | Source: Home / Self Care | Admitting: Internal Medicine

## 2010-07-25 ENCOUNTER — Ambulatory Visit (HOSPITAL_COMMUNITY): Payer: Medicare Other

## 2010-07-29 ENCOUNTER — Ambulatory Visit (HOSPITAL_COMMUNITY)
Admission: RE | Admit: 2010-07-29 | Discharge: 2010-07-29 | Disposition: A | Discharge status: Home / Self Care | Payer: Medicare Other | Source: Ambulatory Visit | Attending: Internal Medicine | Admitting: Internal Medicine

## 2010-07-29 DIAGNOSIS — Z1231 Encounter for screening mammogram for malignant neoplasm of breast: Secondary | ICD-10-CM | POA: Insufficient documentation

## 2010-08-02 ENCOUNTER — Telehealth: Payer: Self-pay | Admitting: *Deleted

## 2010-08-02 NOTE — Telephone Encounter (Signed)
Pt reports that she left her meds ( including vicodin) in the car while at [facility] and when she came back they were gone. She would like these replaced asap. Shall i call her and request the police report? Please advise, thank you

## 2010-08-02 NOTE — Telephone Encounter (Signed)
Our opiate agreement clearly states that lost meds or scripts will not be replaced. The police report isn't reliable because anyone can make a claim and have it written up. It will be up to her PCP to replace on a 1 time only basis.

## 2010-08-15 ENCOUNTER — Emergency Department (HOSPITAL_COMMUNITY)
Admission: EM | Admit: 2010-08-15 | Discharge: 2010-08-15 | Discharge status: Against Medical Advice | Payer: Medicare Other | Attending: Emergency Medicine | Admitting: Emergency Medicine

## 2010-08-15 ENCOUNTER — Telehealth: Payer: Self-pay | Admitting: *Deleted

## 2010-08-15 DIAGNOSIS — R079 Chest pain, unspecified: Secondary | ICD-10-CM | POA: Insufficient documentation

## 2010-08-15 DIAGNOSIS — Z9889 Other specified postprocedural states: Secondary | ICD-10-CM | POA: Insufficient documentation

## 2010-08-15 DIAGNOSIS — Z79899 Other long term (current) drug therapy: Secondary | ICD-10-CM | POA: Insufficient documentation

## 2010-08-15 NOTE — Telephone Encounter (Signed)
Pt called with c/o chest pain, steady.  Onset yesterday.  Nausea, SOB, and diaphoresis.  Gets better is she is still but still has soreness.  Pt advised to go to ED now for evaluation. Pt voices understanding.

## 2010-08-16 ENCOUNTER — Telehealth: Payer: Self-pay | Admitting: *Deleted

## 2010-08-16 NOTE — Telephone Encounter (Signed)
Pt called yesterday with c/o chest pain.  She was sent to ED  In ED she was given med for BP and had chest xray but she left before Labs done.  Today still feels soreness around chest.  She will NOT return to ED as advised. Appointment given for tomorrow

## 2010-08-17 ENCOUNTER — Encounter: Payer: Medicare Other | Admitting: Internal Medicine

## 2010-08-18 ENCOUNTER — Ambulatory Visit (INDEPENDENT_AMBULATORY_CARE_PROVIDER_SITE_OTHER): Payer: Medicare Other | Admitting: Internal Medicine

## 2010-08-18 ENCOUNTER — Encounter: Payer: Self-pay | Admitting: Internal Medicine

## 2010-08-18 VITALS — BP 119/79 | HR 76 | Temp 97.2°F | Wt 198.1 lb

## 2010-08-18 DIAGNOSIS — R079 Chest pain, unspecified: Secondary | ICD-10-CM

## 2010-08-18 NOTE — Progress Notes (Signed)
  Subjective:    Patient ID: Laura [name], female    DOB: 05-31-1946, 65 y.o.   MRN: [id number]  Chest Pain    65 yr old woman with complaints of chest pain. On 2/28, woke with chest discomfort in left upper sternal resion, 2nd and third interspcace, no associated symptoms. Ran a few errands, came back home, got alarmed it had persisted and called EMS---to ER, waited in ER for 2 hours, got frustrated and left prior to any blood work being drawn. Called this morning for an appointment and was worked into the schedule. Pain has persisted on and off since, not worse, no pattern with meals or exertion.  Review of Systems  Cardiovascular: Positive for chest pain.   No prior episodes    Objective:   Physical Exam  Constitutional: She appears well-developed and well-nourished.  Eyes: Conjunctivae and EOM are normal.  Neck: Neck supple. No thyromegaly present.  Cardiovascular: Normal rate, regular rhythm, normal heart sounds and intact distal pulses.  Exam reveals no gallop and no friction rub.   No murmur heard. Pulmonary/Chest: Effort normal and breath sounds normal.   She exhibits tenderness.  Lymphadenopathy:    She has no cervical adenopathy.          Assessment & Plan:

## 2010-08-30 ENCOUNTER — Other Ambulatory Visit (HOSPITAL_COMMUNITY): Payer: Medicare Other

## 2010-08-31 ENCOUNTER — Ambulatory Visit (HOSPITAL_COMMUNITY)
Admission: RE | Admit: 2010-08-31 | Discharge: 2010-08-31 | Disposition: A | Discharge status: Home / Self Care | Payer: Medicare Other | Source: Ambulatory Visit | Attending: Internal Medicine | Admitting: Internal Medicine

## 2010-08-31 DIAGNOSIS — R079 Chest pain, unspecified: Secondary | ICD-10-CM | POA: Insufficient documentation

## 2010-09-01 ENCOUNTER — Ambulatory Visit: Payer: Medicare Other | Admitting: Physician Assistant

## 2010-09-01 ENCOUNTER — Ambulatory Visit: Payer: Self-pay | Admitting: Obstetrics & Gynecology

## 2010-11-01 NOTE — Op Note (Signed)
Laura [name], Laura [name]              ACCOUNT NO.:  [id number]   MEDICAL RECORD NO.:  [id number]          PATIENT TYPE:  OIB   LOCATION:  5034                         FACILITY:  [facility]   PHYSICIAN:  Mark C. Ophelia Charter, M.D.    DATE OF BIRTH:  1946/05/25   DATE OF PROCEDURE:  05/20/2008  DATE OF DISCHARGE:                               OPERATIVE REPORT   POSTOPERATIVE DIAGNOSIS:  Recurrent L5-S1 biforaminal stenosis with  recurrent left herniated nucleus pulposus.   POSTOPERATIVE DIAGNOSIS:  Recurrent L5-S1 biforaminal stenosis with  recurrent left herniated nucleus pulposus.   PROCEDURE:  Reexploration with bilateral L5-S1 foraminotomy and left L5-  S1 microdiskectomy.   SURGEON:  Mark C. Ophelia Charter, MD   ASSISTANT:  Wende Neighbors, PA-C   ANESTHESIA:  GOT plus Marcaine local.   ESTIMATED BLOOD LOSS:  200 mL.   After induction with general anesthesia orotracheal intubation, the  patient was placed on chest rolls, careful padding positioning shoulder  pads.  The area was prepped with DuraPrep, squared with towels, Betadine  and Vi-Drape was applied and laminectomy sheet.  Surgical checklist was  completed for the time-out procedure.  Spinal needle was placed.  Cross-  table lateral x-ray showed the needle was just above the disk space.  Incision was made excising the old scar extending slightly caudad and  subperiosteal dissection down on to the spinous process was performed.  The thick scar tissue was present on the left side.  Soft tissue was all  dissected up to the facets and self-retaining retractor was placed.  A  Kocher clamp was placed and a repeat x-ray taken, angling trying to get  at the appropriate level of the disk and this was actually too low.  With Kocher clamps, there was minimal motion from the patient's severe  arthritis.  Continued dissection was performed and then clamp again was  taken when it was localized at appropriate level of the disk, which  confirmed this was  at the level of the disk.  Half to two-thirds of the  lamina was removed on the left side and half the lamina on the right  side.  Bone was removed out to the level of the pedicles on both sides  and bone was removed down below the level of the disk space all the way  out around the edge of the pedicle.  At the level of the disk, there was  excessive scar tissue on the left side.  Right side was explored first  and there was diffuse disk bulge and spur overhang causing moderate to  severe foraminal stenosis.  Overhanging spurs were trimmed back angling  from the opposite left side as Maud Deed, PA, gently retracted the  nerve.  Undercutting and removing the spurs off the foramina was  performed.  Nerve root above was inspected and care was taken to make  sure we would not damage this.  The spurs removed.  Once this was done,  disk showed slight bulge and did not require microdiskectomy.  On the  left side extensive scar tissue, microdissection with microscope was  used to  remove the scar tissue directly off the dura mobilized nerve  root, which was stuck down to the posterior aspect of the vertebral  body.  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  More lamina was removed on the  left side until the nerve root above was visualized and following  directly and on top of the nerve root above and removing overhanging  spurs and then more caudad and the foramina removing overhanging bone  coming off just above the level of the pedicle.  Once this was finally  completed, a hockey stick was able to be passed over the top of the  nerve root with freedom where initially even the tip of the hockey stick  would not pass.  Disk material was removed and this was teased out.  The  footed impactor was used to impact some of the disk material that was  able to be more  easily removed as well as impaction of the endplate  spurs.  After decompression and irrigation, some FloSeal was inserted  due to small amount of epidural bleeding.  Thin-layered closure with 0  #1 Vicryl suture, sterile Vicryl subcutaneous tissue, subcuticular skin  closure.  Tincture of Benzoin, Steri-Strips, Marcaine infiltration.  Postop dressing and transfer to recovery room.      Mark C. Ophelia Charter, M.D.  Electronically Signed     MCY/MEDQ  D:  05/20/2008  T:  05/21/2008  Job:  [id number]

## 2010-11-01 NOTE — Op Note (Signed)
NAMEOMAH, DEWALT NO.:  [id number]   MEDICAL RECORD NO.:  [id number]          PATIENT TYPE:  INP   LOCATION:  2899                         FACILITY:  [facility]   PHYSICIAN:  Mark C. Ophelia Charter, M.D.    DATE OF BIRTH:  March 13, 1946   DATE OF PROCEDURE:  02/11/2007  DATE OF DISCHARGE:                               OPERATIVE REPORT   PREOPERATIVE DIAGNOSIS:  Cervical pseudoarthrosis C3-4, C4-5 status post  anterior cervical diskectomy and fusion.   POSTOPERATIVE DIAGNOSIS:  Cervical pseudoarthrosis C3-4, C4-5 status  post anterior cervical diskectomy and fusion.   PROCEDURE:  Posterior C3-C5 posterior fusion with wiring, Vitoss and  right posterior iliac crest aspirate, plus local bone.   SURGEON:  Annell Greening, M.D.   ASSISTANT:  Maud Deed, P.A.-C.   ANESTHESIA:  GOT.   PROCEDURE:  After induction of general anesthesia orotracheal  intubation, the patient was placed prone with horseshoe head holder,  careful positioning; eyes were checked.  She was put in reverse  Trendelenburg to take pressure off the face and head.  Skin tape was  used in thoracic region to help with operative field, and the occiput  had a small area clipped but not shaped.  Arms were tucked with careful  yellow pads over the ulnar nerve, and right posterior iliac crest was  exposed.  DuraPrep was used in both areas after 10/10 drapes were used  with tincture of Benzoin squaring the areas.  Sterile skin marker was  used in the skin after the area was squared with sterile towels,  Betadine bio drape application both sites, sterile Mayo stand at the  head and thyroid sheet.  Incision was made in the midline from C2-C5.  Subperiosteal dissection was performed, and a Kocher clamp was placed  between C3 and C4, and cross-table lateral x-ray was taken which  confirmed that was the appropriate position.  Subperiosteal dissection  at 3, 4 and 5 both right and left after the __________  was performed.  Small notch was made in the top of C3.  While x-rays were being  developed, iliac aspirate was performed, taking 10 mL going deep for the  first aspirate and then backing out a couple centimeters, changing  directions in order to maximize the ostial progenitor cells.  This was  done with a stab incision, sounding with the blunt, and once iliac crest  was palpable, the sharp was introduced and impacted with the hammer and  then using the blunt to go down in between the iliac crest.  This was  the later sealed with the band at the end of the case.  Once the bone  marrow was taken, it was placed on a 10 mL Vitoss strip to let it soak  in.  Bone was prepared using a bone __________  on the bone.  Pieces  from burring were placed on top of the Vitoss mixed together with the  local bone.  In addition, the Vitoss strip was cut in half, and the  lamina at 3, 4 and 5 was repaired with the 4-mm bur.  Strips to the  Vitoss were packed in, in the appropriate place after a wire 24-gauge  was twisted into a cable.  It was passed around the notch at the top of  C3 using a right-angled clamp underneath 5 and then twisted down  securely.  X-ray was taken, confirming it was in planned position, and  then the Vitoss was added.  Operative field was relatively dry, and  fascia was closed with 0 Vicryl, 2-0 Vicryl subcutaneous  tissue, 4-0 Vicryl subcuticular closure.  Tincture of Benzoin, Steri-  Strips, Marcaine infiltration, postoperative dressing, soft cervical  collar.  Instrument and needle count was correct.  The patient tolerated  the procedure well and was transferred to the recovery room in stable  condition.      Mark C. Ophelia Charter, M.D.  Electronically Signed     MCY/MEDQ  D:  02/11/2007  T:  02/12/2007  Job:  [id number]

## 2010-11-04 NOTE — Op Note (Signed)
Laura [name], Laura [name]              ACCOUNT NO.:  [id number]   MEDICAL RECORD NO.:  [id number]          PATIENT TYPE:  INP   LOCATION:  2550                         FACILITY:  [facility]   PHYSICIAN:  Mark C. Ophelia Charter, M.D.    DATE OF BIRTH:  May 11, 1946   DATE OF PROCEDURE:  12/15/2005  DATE OF DISCHARGE:                                 OPERATIVE REPORT   PREOPERATIVE DIAGNOSIS:  Cervical spondylosis C3-4, C4-5.   POSTOPERATIVE DIAGNOSIS:  Cervical spondylosis C3-4, C4-5.   PROCEDURE:  C3-4, C4-5 anterior cervical diskectomy and fusion, allograft  and plating.   SURGEON:  Mark C. Ophelia Charter, M.D.   ASSISTANT:  Vanita Panda. Magnus Ivan, M.D.   ANESTHESIA:  GOT.   ESTIMATED BLOOD LOSS:  Less than 100 mL.   DRAINS:  One Hemovac in the neck.   INDICATIONS FOR PROCEDURE:  This 65 year old female followed for  longstanding cervical spondylosis with narrowing down in the 7 to 8 mm range  at multiple levels.  She basically has four levels that are stenotic with  spondylosis; however, at the C3-4 level she has some anterolisthesis with  shifting of the flexion and extension and more significant narrowing at the  upper two levels.   DESCRIPTION OF PROCEDURE:  After induction of general anesthesia with Ancef  for prophylaxis, a thorough prep in the usual position with the held Holter  traction applied but no weight with a sterile Mayo stand at the head of the  head of the table.  Usual Betadine and Vi-drape and thyroid sheets and  drapes.  Sterile skin marker and Vi-drape application.  An incision was made  starting at the midline, extending to the left, over a proximal skin fold  crease.  Dissection down to the midline with the carotid sheath and contents  lateral on the left.  A needle localization showed the needle was too low.  Dissection continue more proximally.  The left superior thyroid vessel was  divided after a ligature.  Needle localization at the upper level confirmed  with  cross-table lateral C-arm that this was the appropriate C3-4 level,  which is most stenotic.  Spurs had to be removed off the front in order for  the Cloward teeth retractors to fit right and left, since the spurs were so  large.  The disk space was 1 to 2 mm, depending upon the position.  Continued burring and scraping with the Cloward curet, back to the posterior  cortex was performed with over-lapping of the cortex posteriorly.  Thick  chunks of ligamentum were removed until the dura was completely visualized.  After a good decompression with dura was visualized, sizers were used, and 6  mm was appropriate.  A Biomed allograft 6 mm with DBX putty was inserted and  implanted.  An identical procedure was repeated at the C4-5 level.  This  level showed a 1 mm disk space with a significant spondylosis and large  posterior spurs, but not as prominent as at the C3-4 level.  The vertebral  joints were stripped and a 4 mm Kerrison was removed to remove the spurs,  right and left gutters.  Sizing showed the 7 mm graft was appropriate for  this level.  DBX putty was used as well.  It was inserted and a 28 mm plate  was selected.  Spurs had to be debrided off the bone to have it set down  flush with the bone.  It was checked under fluoroscopy and was slightly  toward the right side on AP, with minimal obliquity.  It was adjusted  slightly and screws were inserted at all four corners.  A final picture was  taken under fluoroscopy.  There is egress on each side of the graft for  fluid, and the bottom right screw at C5 did not bite well and was exchanged  for a 5 mm which had a good purchase.  The final position looked good after  irrigation with saline solution.  It was inspected and the operative field  was dry.  A Hemovac was placed through a separate stab incision in the  operative technique in line with the skin incision.  The platysma was closed  with #3-0 and #4-0 Vicryl subcuticular closure.   Tincture of Benzoin and  Steri-Strips.  Marcaine infiltration.  A 4 x 4, tape and a soft sterile  collar.  The instrument count and needle count was correct.   The patient was transferred to the recovery room in stable condition.      Mark C. Ophelia Charter, M.D.  Electronically Signed     MCY/MEDQ  D:  12/15/2005  T:  12/15/2005  Job:  [id number]

## 2010-11-04 NOTE — Discharge Summary (Signed)
[name], Laura [name]              ACCOUNT NO.:  [id number]   MEDICAL RECORD NO.:  [id number]          PATIENT TYPE:  INP   LOCATION:  5025                         FACILITY:  [facility]   PHYSICIAN:  Mark C. Ophelia Charter, M.D.    DATE OF BIRTH:  [birth year]   DATE OF ADMISSION:  12/15/2005  DATE OF DISCHARGE:  12/17/2005                                 DISCHARGE SUMMARY   FINAL DIAGNOSIS:  Cervical spinal stenosis.   ADDITIONAL DIAGNOSIS:  Tobacco use.   HOSPITAL COURSE:  This 65 year old female has had progressive right upper  extremity pain and numbness with cervical stenosis on MRI scan, multi levels  with the worse pain at C3-C4, C4-C5.  She is admitted for a 2 level  procedure.  She has anterolisthesis at C3-C4 with instability.  After  informed consent and on a routine admission labs, which showed a chest x-ray  with some basilar atelectasis.  Normal CBC, normal CMP, and normal  urinalysis with the exception of small leukocyte esterase and rare bacteria.  The patient was taken to the operating room and underwent C3-C4, C4-C5  anterior cervical diskectomy with allograft and plating, by  Dr. Annell Greening  with Dr. Allie Bossier assisting.  One hematic drain was left in the neck.  Postoperatively, she had a little bit of numbness in her hand.  Hemovac was  removed, there was minimal drainage.  Dressing was changed, she was  ambulatory, converted to p.o. pain medication, and discharged on December 17, 2005 in satisfactory condition.  Neurologic exam was intact.  X-rays of her  neck showed good position of the plate by fluoroscopic pictures taken  intraoperatively.  Office follow up in 1 week.      Mark C. Ophelia Charter, M.D.  Electronically Signed     MCY/MEDQ  D:  01/10/2006  T:  01/10/2006  Job:  [id number]

## 2010-11-10 ENCOUNTER — Encounter: Payer: Self-pay | Admitting: Internal Medicine

## 2010-11-24 ENCOUNTER — Ambulatory Visit: Payer: Medicare Other | Admitting: Internal Medicine

## 2010-11-29 ENCOUNTER — Encounter: Payer: Self-pay | Admitting: Internal Medicine

## 2010-11-29 ENCOUNTER — Ambulatory Visit (INDEPENDENT_AMBULATORY_CARE_PROVIDER_SITE_OTHER): Payer: Medicare Other | Admitting: Internal Medicine

## 2010-11-29 ENCOUNTER — Telehealth: Payer: Self-pay | Admitting: *Deleted

## 2010-11-29 VITALS — BP 133/88 | HR 73 | Temp 97.2°F | Resp 20 | Ht 67.0 in | Wt 198.4 lb

## 2010-11-29 DIAGNOSIS — F329 Major depressive disorder, single episode, unspecified: Secondary | ICD-10-CM

## 2010-11-29 DIAGNOSIS — F3289 Other specified depressive episodes: Secondary | ICD-10-CM

## 2010-11-29 DIAGNOSIS — G609 Hereditary and idiopathic neuropathy, unspecified: Secondary | ICD-10-CM

## 2010-11-29 DIAGNOSIS — F17200 Nicotine dependence, unspecified, uncomplicated: Secondary | ICD-10-CM

## 2010-11-29 DIAGNOSIS — IMO0002 Reserved for concepts with insufficient information to code with codable children: Secondary | ICD-10-CM

## 2010-11-29 DIAGNOSIS — E785 Hyperlipidemia, unspecified: Secondary | ICD-10-CM

## 2010-11-29 MED ORDER — AMITRIPTYLINE HCL 100 MG PO TABS: 100.0000 mg | ORAL_TABLET | Freq: Every day | ORAL | Status: DC

## 2010-11-29 NOTE — Assessment & Plan Note (Signed)
She denies being depressed as of right now. She does not want any further help regarding this problem. I started her on amitriptyline for her neuropathy pain which would help with her depression.

## 2010-11-29 NOTE — Assessment & Plan Note (Signed)
She has not had any smoke for last 2 months. She had first quitabout 4 months ago but then the last for a few cigarettes. However she returned and has not had any cigarettes in the last 2 months. For which I have complimented her and encouraged her. I offered lozenges as was the couldn't patch. However patient is not interested in them as of now.

## 2010-11-29 NOTE — Patient Instructions (Signed)
Put over the counter 1% hydrocortisone cream over the right elbow area of burning/itching for 3 days. You are started on new medication called Elavil. Take it before going to bed once a day. Follow up in 3 months.

## 2010-11-29 NOTE — Telephone Encounter (Signed)
Pt called to ask for her med list to be updated.  She was seen in clinic today and given AVS with list of meds. Her med list has her on neurontin 300mg  but she is taking neurontin 600 mg

## 2010-11-29 NOTE — Assessment & Plan Note (Signed)
The discussed at length what her options are. Patient does not want to go back to neurosurgeon for further evaluation. She's not interested in further surgery at this time. She will follow up with the sports medicine physician and, pain physician. They're exploring the possibility of nerve stimulation to relieve her pain. Her start her on amitriptyline once a night so that she has better sleep as well as better control neuropathy pain. She was noted interested in increasing  Neurontin dose as of right now.

## 2010-11-29 NOTE — Assessment & Plan Note (Signed)
Vision not a known diabetic therefore not on any LDL lowering medications. Patient is aware of the diagnoses and instructed to use exercise and last a modification to lower her LDL. Her total cholesterol is less than 200

## 2010-11-29 NOTE — Progress Notes (Signed)
  Subjective:    Patient ID: Laura [name], female    DOB: 1945/09/18, 65 y.o.   MRN: [id number]  HPI  65 years old female with past medical history of depression, peripheral neuropathy involving the left lower extremity and back pain with radiculopathy see her for followup.she used to smoke regularly but now has quit. Patient reports that she takes 600 mg of Neurontin 3 times a day this helps relieve the pain but does not eliminate it. She reports that she's having back pain which is getting worse.she follows with the pain management clinic who prescribed her Vicodin.   The sugars are her symptoms mainly involve the left leg and described as pain that radiates shooting down all the way to her foot. Patient reports that the symptoms are worsening. The relief from the Neurontin is intermittent and short lasting.she requests changes in her medication.  Patient also reports that she is having some burning sensation around her right elbow. This is a new symptom that started 2 days ago. Patient suspects that she had an insect bite. However she cannot describe an insect and there is no particular lesion around it. Patient denies fever, nausea, headache, blurred vision, abdominal pain, changes in bowel bladder habit at this time.  Review of Systems  All other systems reviewed and are negative.       Objective:   Physical Exam  Constitutional: She is oriented to person, place, and time. She appears well-developed and well-nourished.  HENT:  Head: Normocephalic and atraumatic.  Right Ear: External ear normal.  Left Ear: External ear normal.  Eyes: Conjunctivae and EOM are normal. Pupils are equal, round, and reactive to light.  Neck: No JVD present. No tracheal deviation present. No thyromegaly present.  Cardiovascular: Normal rate, regular rhythm and normal heart sounds.  Exam reveals no gallop.   No murmur heard. Pulmonary/Chest: No respiratory distress. She has no wheezes. She has no rales.  She exhibits no tenderness.  Abdominal: Soft. Bowel sounds are normal. She exhibits no distension and no mass. There is no tenderness. There is no rebound and no guarding.  Musculoskeletal: Normal range of motion. She exhibits no edema and no tenderness.  Lymphadenopathy:    She has no cervical adenopathy.  Neurological: She is alert and oriented to person, place, and time. She has normal strength and normal reflexes. No cranial nerve deficit. She displays a negative Romberg sign. Coordination normal.       Straight leg raising is negative. There is muscle spasm and pain around the lower back. Patient uses cane. Patient was prescribed walker but does not use it as of now.  Skin: No rash noted. No erythema.  Psychiatric: She has a normal mood and affect. Her behavior is normal. Thought content normal.          Assessment & Plan:

## 2010-11-30 ENCOUNTER — Other Ambulatory Visit: Payer: Self-pay | Admitting: Internal Medicine

## 2010-11-30 MED ORDER — GABAPENTIN 300 MG PO CAPS: 600.0000 mg | ORAL_CAPSULE | Freq: Three times a day (TID) | ORAL | Status: DC

## 2010-11-30 NOTE — Telephone Encounter (Signed)
Done

## 2010-12-05 ENCOUNTER — Telehealth: Payer: Self-pay | Admitting: *Deleted

## 2010-12-12 NOTE — Telephone Encounter (Signed)
Needs form signed

## 2010-12-15 ENCOUNTER — Encounter: Payer: Medicare Other | Admitting: Internal Medicine

## 2011-01-05 ENCOUNTER — Encounter: Payer: Self-pay | Admitting: Internal Medicine

## 2011-01-05 ENCOUNTER — Ambulatory Visit (INDEPENDENT_AMBULATORY_CARE_PROVIDER_SITE_OTHER): Payer: Medicare Other | Admitting: Internal Medicine

## 2011-01-05 VITALS — BP 134/93 | HR 97 | Temp 97.8°F | Ht 67.0 in | Wt 200.7 lb

## 2011-01-05 DIAGNOSIS — IMO0002 Reserved for concepts with insufficient information to code with codable children: Secondary | ICD-10-CM

## 2011-01-05 DIAGNOSIS — K219 Gastro-esophageal reflux disease without esophagitis: Secondary | ICD-10-CM | POA: Insufficient documentation

## 2011-01-05 DIAGNOSIS — F17200 Nicotine dependence, unspecified, uncomplicated: Secondary | ICD-10-CM

## 2011-01-05 DIAGNOSIS — Z0000 Encounter for general adult medical examination without abnormal findings: Secondary | ICD-10-CM

## 2011-01-05 MED ORDER — PANTOPRAZOLE SODIUM 40 MG PO TBEC: 40.0000 mg | DELAYED_RELEASE_TABLET | Freq: Every day | ORAL | Status: DC

## 2011-01-05 MED ORDER — GABAPENTIN 600 MG PO TABS: 600.0000 mg | ORAL_TABLET | Freq: Three times a day (TID) | ORAL | Status: DC

## 2011-01-05 NOTE — Progress Notes (Signed)
Called to pharm 

## 2011-01-05 NOTE — Assessment & Plan Note (Addendum)
1. Pap smear    Pt is due for pap smear and she declined it. 2. Mammogram   Done on 07/29/10. Recommended yearly per report. 3. Colonoscopy Done 07/18/10. Multiple tubular adenomas. No high grade dysplasia or malignancy identified.   recommended every three years

## 2011-01-05 NOTE — Patient Instructions (Signed)
1. Follow up with the clinic in 2 weeks 2. Will prescribe you    Protonix 40 mg po daily  3. Avoid large meals at dinner time 4. Avoid late night snacks. 5. Please go to ER if you experience any urine/stool incontinence before your appointment with your surgeon tomorrow.

## 2011-01-05 NOTE — Assessment & Plan Note (Addendum)
I think that she has GERD which induces her cough spells at night time. - will prescribe protonix 40 mg po daily. - instructed her to avoid late night snack or large meals at dinner time.

## 2011-01-05 NOTE — Assessment & Plan Note (Signed)
Quit smoking in May of this year. Pt has no c/o.

## 2011-01-05 NOTE — Progress Notes (Signed)
Subjective:    Patient ID: Laura [name], female    DOB: 10-06-1945, 65 y.o.   MRN: [id number]  HPI  This is a 65 yo lady with PMH of generalized degenerative discs disease,back radiculopathy, and hyperlipidemia presents to the clinic today with back and left leg pain.  H/O back and left leg pain. Worsening of symptoms x 2 weeks. Lower back and left side buttock pain. Aching pain, constant. No aggravating or alleviating factors. No radiation.Pt also c/o worsening of left lower leg and foot pain and numbness.   Denies sciatic pain. No redness or swelling noted at left extremity. Denies recent travel. Pt states that she has been followed by her orthopedic surgeon Dr. Ethelene Hal and has an appointment with him tomorrow.  Pt reports that she has had intermittent cough spells at night time x 1 month,  accompanied with heart burn. She also feels as if she is unable to catch her breath when these cough spells happens. No chest pain or chest pressure. No palpitation. Denies nausea, vomiting or abdominal pain. Pt states that she eats constantly and frequently has late night snack.    Past Medical History  Diagnosis Date  . Lymphadenopathy     documented on chest CT scan 12/15/2005  . Substance abuse     tobacco  . Chronic kidney disease     ?renal cyst on MRI    Past Surgical History  Procedure Date  . Spine surgery     s/p C3-4, C4-5 diskectomy, fusion, plating and allograft, s/p posterior C3-5 fusion with wiring(02/11/2007 by Dr Ophelia Charter) had psudoarthrosis lumbar surgery 2000)   History   Social History  . Marital Status: Single    Spouse Name: N/A    Number of Children: 0  . Years of Education: N/A   Occupational History  .     Social History Main Topics  . Smoking status: Former Smoker    Types: Cigarettes    Quit date: 10/29/2010  . Smokeless tobacco: Not on file  . Alcohol Use: Yes     social during holidays  . Drug Use: No  . Sexually Active: Not on file   Other Topics  Concern  . Not on file   Social History Narrative   Patient's new Phone # [phone] and [phone].   Family History  Problem Relation Age of Onset  . Diabetes Father   . Hypertension Father   No Known Allergies   Review of Systems  No headache, fever, or sore throat. No shortness of breath or dyspnea on exertion. No chest pain, chest pressure or palpitation.  No nausea, vomiting, or abdominal pain. No melena, diarrhea or incontinence. No muscle weakness.                   Denies depression. No appetite or weight changes.      Objective:   Physical Exam Mild distress 2/2 left leg pain. Uses a cane maintain balance. General: alert, well-developed, and cooperative to examination.  Mouth: pharynx pink and moist. Lungs: normal respiratory effort, no accessory muscle use, normal breath sounds, no crackles, and no wheezes. Heart: normal rate, regular rhythm, no murmur, no gallop, and no rub.  Abdomen: soft, non-tender, normal bowel sounds, no distention, no guarding, no rebound tenderness, no hepatomegaly, and no splenomegaly.  Msk: no joint swelling, no joint warmth, and no redness over joints.  Pulses: 2+ DP/PT pulses bilaterally Extremities: No cyanosis, clubbing, edema Neurologic: alert & oriented X3, cranial nerves II-XII intact, Left  lower leg and foot numbness. No tingling or muscle weakness. No tenderness. Right LE WNL. Leg raising tests NEGATIVE bilaterally. Bilateral reflexes WNL. Skin: turgor normal and no rashes.  Psych: Oriented X3, memory intact for recent and remote, normally interactive, good eye contact.  not depressed appearing.         Assessment & Plan:

## 2011-01-05 NOTE — Assessment & Plan Note (Signed)
Pt has chronic h/o back pain with radiculopathy and had several back surgeries in the past. She states that she has always had left leg pain and numbness with it. C/o worsening of her symptoms x 2 weeks. Leg raising tests NEGATIVE. Pt states that she has been followed by her orthopedic surgeon Dr. Ethelene Hal, and she has an appointment with him tomorrow.   Dr. Aundria Rud discussed in length about her possible treatment options for her back problems. - instructed her to follow up with her appointment tomorrow.

## 2011-01-06 NOTE — Progress Notes (Signed)
I precepted this patient with Dr. Dierdre Searles.  Agree with management and notes.

## 2011-01-09 NOTE — Progress Notes (Signed)
Agree with assessment and plan 

## 2011-03-24 LAB — DIFFERENTIAL
Basophils Absolute: 0.1 10*3/uL (ref 0.0–0.1)
Eosinophils Absolute: 0.3 10*3/uL (ref 0.0–0.7)
Eosinophils Relative: 4 % (ref 0–5)
Lymphocytes Relative: 29 % (ref 12–46)
Lymphs Abs: 2.5 10*3/uL (ref 0.7–4.0)
Neutrophils Relative %: 60 % (ref 43–77)

## 2011-03-24 LAB — URINALYSIS, ROUTINE W REFLEX MICROSCOPIC
Glucose, UA: NEGATIVE mg/dL
Hgb urine dipstick: NEGATIVE
Ketones, ur: NEGATIVE mg/dL
Protein, ur: NEGATIVE mg/dL
Urobilinogen, UA: 1 mg/dL (ref 0.0–1.0)

## 2011-03-24 LAB — COMPREHENSIVE METABOLIC PANEL
ALT: 17 U/L (ref 0–35)
CO2: 28 mEq/L (ref 19–32)
Calcium: 9.9 mg/dL (ref 8.4–10.5)
Chloride: 107 mEq/L (ref 96–112)
Creatinine, Ser: 0.81 mg/dL (ref 0.4–1.2)
GFR calc non Af Amer: >60 mL/min (ref >60)
Glucose, Bld: 90 mg/dL (ref 70–99)
Total Bilirubin: 0.8 mg/dL (ref 0.3–1.2)

## 2011-03-24 LAB — PROTIME-INR
INR: 1 (ref 0.00–1.49)
Prothrombin Time: 13.6 seconds (ref 11.6–15.2)

## 2011-03-24 LAB — CBC
Hemoglobin: 13.1 g/dL (ref 12.0–15.0)
MCHC: 33.1 g/dL (ref 30.0–36.0)
MCV: 95.4 fL (ref 78.0–100.0)
RBC: 4.15 MIL/uL (ref 3.87–5.11)
WBC: 8.6 10*3/uL (ref 4.0–10.5)

## 2011-03-31 LAB — URINALYSIS, ROUTINE W REFLEX MICROSCOPIC
Bilirubin Urine: NEGATIVE
Nitrite: NEGATIVE
Specific Gravity, Urine: 1.023
Urobilinogen, UA: 0.2
pH: 7

## 2011-03-31 LAB — COMPREHENSIVE METABOLIC PANEL
AST: 17
Albumin: 3.7
BUN: 10
Calcium: 9
Creatinine, Ser: 0.69
GFR calc Af Amer: >60
Total Bilirubin: 0.7
Total Protein: 7.6

## 2011-03-31 LAB — CBC
HCT: 39
MCHC: 33.5
MCV: 94.5
Platelets: 272
RDW: 13.5
WBC: 9.4

## 2011-03-31 LAB — URINE MICROSCOPIC-ADD ON

## 2011-03-31 LAB — PROTIME-INR: INR: 1

## 2011-03-31 LAB — APTT: aPTT: 32

## 2011-05-31 ENCOUNTER — Encounter: Payer: Self-pay | Admitting: Internal Medicine

## 2011-05-31 ENCOUNTER — Ambulatory Visit (INDEPENDENT_AMBULATORY_CARE_PROVIDER_SITE_OTHER): Payer: Medicare Other | Admitting: Internal Medicine

## 2011-05-31 DIAGNOSIS — J069 Acute upper respiratory infection, unspecified: Secondary | ICD-10-CM

## 2011-05-31 DIAGNOSIS — K219 Gastro-esophageal reflux disease without esophagitis: Secondary | ICD-10-CM

## 2011-05-31 DIAGNOSIS — IMO0002 Reserved for concepts with insufficient information to code with codable children: Secondary | ICD-10-CM

## 2011-05-31 DIAGNOSIS — M62830 Muscle spasm of back: Secondary | ICD-10-CM

## 2011-05-31 DIAGNOSIS — M5380 Other specified dorsopathies, site unspecified: Secondary | ICD-10-CM

## 2011-05-31 HISTORY — DX: Acute upper respiratory infection, unspecified: J06.9

## 2011-05-31 MED ORDER — GABAPENTIN 600 MG PO TABS: 600.0000 mg | ORAL_TABLET | Freq: Three times a day (TID) | ORAL | Status: DC

## 2011-05-31 MED ORDER — PANTOPRAZOLE SODIUM 40 MG PO TBEC: 40.0000 mg | DELAYED_RELEASE_TABLET | Freq: Every day | ORAL | Status: DC

## 2011-05-31 NOTE — Progress Notes (Signed)
Subjective:   Patient ID: Laura [name] female   DOB: June 06, 1946 65 y.o.   MRN: [id number]  HPI: [name] is a 65 y.o. woman who presents to clinic today complaining of congestion, cough, and diarrhea that started 2 weeks.  She states that she has been feeling better over the last few day but noted that the cough had turned into laryngitis a few days ago.  She denies fevers, chills, nausea, vomiting, or continued diarrhea.  She states she has some mild soreness and her appetite is down but has been improving for the last few days.  She states that she has been coughing up some white sputum which is improved from last week when it was yellowish.  She has been using mucinex and Advil for her symptoms.  She did not get her flu shot yet.  She states that otherwise she has been taking her medication.  She has been taking the gabapentin as needed for her pain and has been using the vicodin to help with the breakthrough pain.  She has continued to have shooting pains down her legs but denies weakness, loss of bowel or bladder control.    She needs refills on her protonix as well.  She states that she has been taking it intermittently since it was prescribed when she has heartburn and is out of refills.  She denies nausea, vomiting, abdominal pain, or current reflux symptoms.    Past Medical History  Diagnosis Date  . Lymphadenopathy     documented on chest CT scan 12/15/2005  . Substance abuse     tobacco  . Chronic kidney disease     ?renal cyst on MRI    Current Outpatient Prescriptions  Medication Sig Dispense Refill  . gabapentin (NEURONTIN) 600 MG tablet Take 1 tablet (600 mg total) by mouth 3 (three) times daily.  180 tablet  0  . HYDROcodone-acetaminophen (VICODIN) 5-500 MG per tablet       . pantoprazole (PROTONIX) 40 MG tablet Take 1 tablet (40 mg total) by mouth daily.  30 tablet  1   Family History  Problem Relation Age of Onset  . Diabetes Father   . Hypertension  Father    History   Social History  . Marital Status: Single    Spouse Name: N/A    Number of Children: 0  . Years of Education: N/A   Occupational History  .     Social History Main Topics  . Smoking status: Former Smoker    Types: Cigarettes    Quit date: 10/29/2010  . Smokeless tobacco: None  . Alcohol Use: Yes     social during holidays  . Drug Use: No  . Sexually Active: None   Other Topics Concern  . None   Social History Narrative   Patient's new Phone # [phone] and [phone].   Review of Systems: Negative except as noted in the HPI.  Objective:  Physical Exam: Filed Vitals:   05/31/11 1548  BP: 132/91  Pulse: 88  Temp: 97 F (36.1 C)  TempSrc: Oral  Height: 5\' 7"  (1.702 m)  Weight: 208 lb 1.6 oz (94.394 kg)   Constitutional: Vital signs reviewed.  Patient is a well-developed and well-nourished woman in no acute distress and cooperative with exam. Alert and oriented x3.  Head: Normocephalic and atraumatic Ear: TM normal bilaterally Mouth: mild posterior pharynx erythema but no exudates, MMM Eyes: PERRL, EOMI, conjunctivae normal, No scleral icterus.  Neck: Supple, Trachea midline normal  ROM, No JVD, mass, thyromegaly, or carotid bruit present.  Cardiovascular: RRR, S1 normal, S2 normal, no MRG, pulses symmetric and intact bilaterally Pulmonary/Chest: CTAB, no stridor, wheezes, rales, or rhonchi Abdominal: Soft. Non-tender, non-distended, bowel sounds are normal, no masses, organomegaly, or guarding present.  GU: no CVA tenderness Musculoskeletal: No joint deformities, erythema, or stiffness, ROM full and no nontender Hematology: no cervical, inginal, or axillary adenopathy.  Skin: Warm, dry and intact. No rash, cyanosis, or clubbing. .   Assessment & Plan:

## 2011-05-31 NOTE — Patient Instructions (Signed)
1.  Continue symptomatic treatment for your viral infection.  - Saline gargles for the throat  - Ibuprofen and mucinex for the congestion and cough.  2.  Make sure you are drinking enough water and getting enough sleep.  3.  Make sure you are taking the Gabapentin every day to help the pain.  4.  Follow up in about 2 months to see your Primary care doctor.

## 2011-06-13 NOTE — Assessment & Plan Note (Signed)
She needs a refill of her protonix today that she has been using intermittently.  We will refill and continue to monitor.

## 2011-06-13 NOTE — Assessment & Plan Note (Signed)
She is recovering from a viral URI that may or may not been influenza.  She is improving with OTC medications.  We will continue symptomatic treatment at this time with saline gargles and OTC medications.  She will return if she spikes a fever >101 or the cough does not improve.

## 2011-06-13 NOTE — Assessment & Plan Note (Signed)
I advised her to take the neurontin on a daily basis to help keep the pain away as opposed to treating it when it happened.

## 2011-06-13 NOTE — Assessment & Plan Note (Signed)
She is only taking her neurontin PRN.  I encouraged her to take it daily to help her pain so she doesn't have to use her other pain medication as often.

## 2011-07-07 ENCOUNTER — Emergency Department (HOSPITAL_COMMUNITY)
Admission: EM | Admit: 2011-07-07 | Discharge: 2011-07-07 | Disposition: A | Discharge status: Home / Self Care | Payer: No Typology Code available for payment source | Attending: Emergency Medicine | Admitting: Emergency Medicine

## 2011-07-07 ENCOUNTER — Emergency Department (HOSPITAL_COMMUNITY): Payer: No Typology Code available for payment source

## 2011-07-07 DIAGNOSIS — M79609 Pain in unspecified limb: Secondary | ICD-10-CM | POA: Insufficient documentation

## 2011-07-07 DIAGNOSIS — N189 Chronic kidney disease, unspecified: Secondary | ICD-10-CM | POA: Insufficient documentation

## 2011-07-07 DIAGNOSIS — Z79899 Other long term (current) drug therapy: Secondary | ICD-10-CM | POA: Insufficient documentation

## 2011-07-07 DIAGNOSIS — M549 Dorsalgia, unspecified: Secondary | ICD-10-CM

## 2011-07-07 DIAGNOSIS — R05 Cough: Secondary | ICD-10-CM | POA: Insufficient documentation

## 2011-07-07 DIAGNOSIS — R059 Cough, unspecified: Secondary | ICD-10-CM | POA: Insufficient documentation

## 2011-07-07 DIAGNOSIS — M545 Low back pain, unspecified: Secondary | ICD-10-CM | POA: Insufficient documentation

## 2011-07-07 MED ORDER — HYDROCODONE-ACETAMINOPHEN 5-325 MG PO TABS
1.0000 | ORAL_TABLET | Freq: Once | ORAL | Status: AC
  Administered 2011-07-07: 1 via ORAL
  Filled 2011-07-07: qty 1
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
## 2011-07-07 NOTE — ED Notes (Signed)
XBM:WUX3<KG> Expected date:07/07/11<BR> Expected time: 6:34 PM<BR> Means of arrival:Ambulance<BR> Comments:<BR> M50 - fall yesterday, back pain, ambulatory ETA 10

## 2011-07-12 ENCOUNTER — Emergency Department (INDEPENDENT_AMBULATORY_CARE_PROVIDER_SITE_OTHER)
Admission: EM | Admit: 2011-07-12 | Discharge: 2011-07-12 | Disposition: A | Discharge status: Home / Self Care | Payer: Medicare Other | Source: Home / Self Care | Attending: Emergency Medicine | Admitting: Emergency Medicine

## 2011-07-12 ENCOUNTER — Telehealth: Payer: Self-pay | Admitting: *Deleted

## 2011-07-12 ENCOUNTER — Encounter (HOSPITAL_COMMUNITY): Payer: Self-pay | Admitting: *Deleted

## 2011-07-12 DIAGNOSIS — J069 Acute upper respiratory infection, unspecified: Secondary | ICD-10-CM

## 2011-07-12 MED ORDER — PREDNISONE 20 MG PO TABS: 40.0000 mg | ORAL_TABLET | Freq: Every day | ORAL | Status: AC

## 2011-07-12 MED ORDER — GUAIFENESIN-CODEINE 100-10 MG/5ML PO SYRP: 5.0000 mL | ORAL_SOLUTION | Freq: Three times a day (TID) | ORAL | Status: AC | PRN

## 2011-07-12 NOTE — Telephone Encounter (Signed)
Pt calls and states she has been sick x 1 week, very congested, weak, h/a, coughing up bloody yellow sputum, having bouts of hot, cold and sweating. She desires an appt, this am, she is referred to urg care for eval and ask to call if needed for a f/u appt

## 2011-07-12 NOTE — ED Provider Notes (Signed)
History     CSN: [id number]  Arrival date & time 07/12/11  1114   First MD Initiated Contact with Patient 07/12/11 1117      Chief Complaint  Patient presents with  . Cough    (Consider location/radiation/quality/duration/timing/severity/associated sxs/prior treatment) Patient is a 66 y.o. female presenting with cough. The history is provided by the patient.  Cough This is a new problem. The current episode started more than 1 week ago. The problem occurs every few minutes. The problem has not changed since onset.The cough is productive of brown sputum and productive of blood-tinged sputum. Associated symptoms include chills, ear congestion, ear pain, rhinorrhea and sore throat. Pertinent negatives include no chest pain, no shortness of breath and no wheezing. She has tried decongestants for the symptoms. The treatment provided no relief. Her past medical history does not include asthma.    Past Medical History  Diagnosis Date  . Lymphadenopathy     documented on chest CT scan 12/15/2005  . Substance abuse     tobacco  . Chronic kidney disease     ?renal cyst on MRI     Past Surgical History  Procedure Date  . Spine surgery     s/p C3-4, C4-5 diskectomy, fusion, plating and allograft, s/p posterior C3-5 fusion with wiring(02/11/2007 by Dr Ophelia Charter) had psudoarthrosis lumbar surgery 2000)    Family History  Problem Relation Age of Onset  . Diabetes Father   . Hypertension Father     History  Substance Use Topics  . Smoking status: Former Smoker    Types: Cigarettes    Quit date: 10/29/2010  . Smokeless tobacco: Not on file  . Alcohol Use: Yes     social during holidays    OB History    Grav Para Term Preterm Abortions TAB SAB Ect Mult Living                  Review of Systems  Constitutional: Positive for chills.  HENT: Positive for ear pain, sore throat and rhinorrhea.   Respiratory: Positive for cough. Negative for chest tightness, shortness of breath and  wheezing.   Cardiovascular: Negative for chest pain.    Allergies  Review of patient's allergies indicates no known allergies.  Home Medications   Current Outpatient Rx  Name Route Sig Dispense Refill  . AMITRIPTYLINE HCL 100 MG PO TABS Oral Take 100 mg by mouth daily.    Marland Kitchen GABAPENTIN 600 MG PO TABS Oral Take 1 tablet (600 mg total) by mouth 3 (three) times daily. 180 tablet 6  . GUAIFENESIN-CODEINE 100-10 MG/5ML PO SYRP Oral Take 5 mLs by mouth 3 (three) times daily as needed for cough. 120 mL 0  . HYDROCODONE-ACETAMINOPHEN 5-500 MG PO TABS Oral Take 1 tablet by mouth every 6 (six) hours as needed. pain    . PANTOPRAZOLE SODIUM 40 MG PO TBEC Oral Take 1 tablet (40 mg total) by mouth daily. 30 tablet 1  . PREDNISONE 20 MG PO TABS Oral Take 2 tablets (40 mg total) by mouth daily. 2 tablets daily for 5 days 10 tablet 0    There were no vitals taken for this visit.  Physical Exam  Nursing note and vitals reviewed. Constitutional: She appears well-developed and well-nourished. No distress.  HENT:  Head: Normocephalic.  Mouth/Throat: Uvula is midline, oropharynx is clear and moist and mucous membranes are normal. No oropharyngeal exudate.  Eyes: Conjunctivae are normal. Right eye exhibits no discharge. Left eye exhibits no discharge.  Neck:  Neck supple. No JVD present.  Cardiovascular: Normal rate.  Exam reveals no friction rub.   No murmur heard. Pulmonary/Chest: Effort normal and breath sounds normal. No accessory muscle usage. Not tachypneic and not bradypneic. No respiratory distress. She has no decreased breath sounds. She has no wheezes. She has no rales. She exhibits no tenderness.  Lymphadenopathy:    She has no cervical adenopathy.  Skin: Skin is warm.    ED Course  Procedures (including critical care time)  Labs Reviewed - No data to display No results found.   1. Viral URI with cough       MDM  COUGH FOR 7 DAYS (FEVER LAST NIGHT TACTILE), No dyspnea.  Comfortable afebrile. God air movment instructed to follow-up with or her doctor in 3-5 days if no improvement or sooner if any changes.        Jimmie Molly, MD 07/12/11 1216

## 2011-07-12 NOTE — ED Notes (Signed)
pT  HAS  SYMPTOMS  OF  COUGH /  CONGESTION    AND   SORETHROAT    X  1  WEEK   SHE  REPORTS    AS  WELL  SYMPTOMS  OF   BLOODY  COUGH  AND  CONGESTED  AS  WELL        SYMPTOMS     X  1  WEEK

## 2011-07-17 NOTE — ED Provider Notes (Signed)
Evaluation and management procedures were performed by the PA/NP under my supervision/collaboration.    Medard Decuir D Jahnae Mcadoo, MD 07/17/11 2029 

## 2011-08-24 ENCOUNTER — Ambulatory Visit (INDEPENDENT_AMBULATORY_CARE_PROVIDER_SITE_OTHER): Payer: Medicare Other | Admitting: Internal Medicine

## 2011-08-24 ENCOUNTER — Encounter: Payer: Self-pay | Admitting: Internal Medicine

## 2011-08-24 DIAGNOSIS — R059 Cough, unspecified: Secondary | ICD-10-CM

## 2011-08-24 DIAGNOSIS — R05 Cough: Secondary | ICD-10-CM

## 2011-08-24 DIAGNOSIS — E785 Hyperlipidemia, unspecified: Secondary | ICD-10-CM

## 2011-08-24 DIAGNOSIS — F17200 Nicotine dependence, unspecified, uncomplicated: Secondary | ICD-10-CM

## 2011-08-24 DIAGNOSIS — M549 Dorsalgia, unspecified: Secondary | ICD-10-CM

## 2011-08-24 DIAGNOSIS — IMO0002 Reserved for concepts with insufficient information to code with codable children: Secondary | ICD-10-CM

## 2011-08-24 DIAGNOSIS — F3289 Other specified depressive episodes: Secondary | ICD-10-CM

## 2011-08-24 DIAGNOSIS — F329 Major depressive disorder, single episode, unspecified: Secondary | ICD-10-CM

## 2011-08-24 DIAGNOSIS — K219 Gastro-esophageal reflux disease without esophagitis: Secondary | ICD-10-CM

## 2011-08-24 DIAGNOSIS — J069 Acute upper respiratory infection, unspecified: Secondary | ICD-10-CM

## 2011-08-24 LAB — COMPREHENSIVE METABOLIC PANEL
BUN: 12 mg/dL (ref 6–23)
CO2: 25 mEq/L (ref 19–32)
Creat: 0.96 mg/dL (ref 0.50–1.10)
Glucose, Bld: 96 mg/dL (ref 70–99)
Sodium: 140 mEq/L (ref 135–145)
Total Bilirubin: 0.4 mg/dL (ref 0.3–1.2)
Total Protein: 6.9 g/dL (ref 6.0–8.3)

## 2011-08-24 LAB — CBC
HCT: 41.1 % (ref 36.0–46.0)
Hemoglobin: 13.5 g/dL (ref 12.0–15.0)
MCH: 31 pg (ref 26.0–34.0)
MCHC: 32.8 g/dL (ref 30.0–36.0)

## 2011-08-24 LAB — LIPID PANEL
Cholesterol: 174 mg/dL (ref 0–200)
Triglycerides: 81 mg/dL (ref <150)
VLDL: 16 mg/dL (ref 0–40)

## 2011-08-24 MED ORDER — AMITRIPTYLINE HCL 100 MG PO TABS: 100.0000 mg | ORAL_TABLET | Freq: Every evening | ORAL | Status: DC | PRN

## 2011-08-24 NOTE — Assessment & Plan Note (Signed)
Stable on protonix prn

## 2011-08-24 NOTE — Assessment & Plan Note (Signed)
Stable, has not been taking amitriptyline.  Pt did not realize that it could help her mood and sleep. She plans to fill prescription today.

## 2011-08-24 NOTE — Progress Notes (Signed)
Subjective:     Patient ID: Laura [name], female   DOB: 20-Oct-1945, 66 y.o.   MRN: [id number]  HPI Ms. Shuey presents to clinic today to meet her new primary physician. She reports in January 2013 that she went to emergency department for cough. I reviewed her records which notes a diagnosis of upper respiratory infection which she was prescribed Robitussin and prednisone taper. Patient reports that she took cough medicine but not the prednisone because she thought it would make her "gain weight". On review of her medication list, she reports that she has not been taking amitriptyline because she"did not know what it was for". She is pleased to be informed that it could help with sleep and her mood. She reports that her back pain has been stable on Vicodin and gabapentin . She has a scheduled evaluation with Dr. Venita Lick (orthopedics) on 08/29/2011 and a followup appointment with pain physician Dr. Ethelene Hal 09/07/11. She denies chest pain, shortness of breath, change in bowel habits, fever, nausea or vomiting.  Review of Systems  Constitutional: Negative for fever and chills.  HENT: Negative for congestion, rhinorrhea, sneezing and postnasal drip.   Eyes: Negative for visual disturbance.  Respiratory: Positive for cough. Negative for shortness of breath and wheezing.        Occasional at night  Cardiovascular: Negative for chest pain.  Genitourinary: Negative for dysuria.  Musculoskeletal: Positive for back pain.       Stable  Neurological: Negative for dizziness, weakness and light-headedness.  Psychiatric/Behavioral: Negative for dysphoric mood.       Hasn't been sleeping well       Objective:   Physical Exam  Constitutional: She is oriented to person, place, and time. She appears well-developed and well-nourished. No distress.  HENT:  Head: Normocephalic and atraumatic.  Eyes: EOM are normal. Pupils are equal, round, and reactive to light.  Neck: Normal range of motion. Neck  supple.  Cardiovascular: Normal rate, regular rhythm, normal heart sounds and intact distal pulses.   Pulmonary/Chest: Effort normal and breath sounds normal. She exhibits no tenderness.  Abdominal: Soft. Bowel sounds are normal. She exhibits no distension.  Musculoskeletal: Normal range of motion. She exhibits no edema and no tenderness.  Neurological: She is alert and oriented to person, place, and time.  Skin: Skin is warm and dry.  Psychiatric: She has a normal mood and affect.       Assessment:     1. GERD: cough c/w that associated with GERD, pt advised to resume protonix therapy  2. Depression: stable advised to take amitriptyline as previously prescribed 3. Back Pain: stable on vicodin and gabapentin, will f/u with Ortho and Pain 4. Tobacco abuse:    Plan:     See detailed problem list

## 2011-08-24 NOTE — Assessment & Plan Note (Signed)
Still smokes 2-3 cigarettes per day.

## 2011-08-24 NOTE — Assessment & Plan Note (Signed)
Stable and w/o complaints on vicodin and gabapentin.  Scheduled to f/u with Dr. Sheela Stack (ortho) 08/29/2011 and Dr. Ethelene Hal (pain) 09/07/2011

## 2011-08-24 NOTE — Assessment & Plan Note (Signed)
Will check lipid panel today. Currently not on statin therapy.

## 2011-08-24 NOTE — Assessment & Plan Note (Signed)
With occasional cough, reports improvement since ED visits in Jan 2013 for cough.  Pt prescribed robitussin and prednisone but reports that she didn't fill the prednisone bc she was told it would make her "gain weight".

## 2011-08-24 NOTE — Patient Instructions (Addendum)
It was nice to meet you today. Your should continue to improve in time. Today we need blood tests which will check your kidney, liver, and cholesterol level.  If we find something abnormal, we will contact you. Continue to take your medications as prescribed.  The amitriptyline will help with sleep and your mood.  Keep your appointment with the pain and spine doctors.  Return to see me in 6-9 months.

## 2011-09-28 ENCOUNTER — Telehealth: Payer: Self-pay | Admitting: *Deleted

## 2011-09-28 NOTE — Telephone Encounter (Signed)
For over 1 year having numbness left foot and leg. Wants answers - appt given with Dr Scot Dock 10/03/11 10:45AM. Stanton Kidney Ameer Sanden RN 09/28/11 3:30PM

## 2011-10-03 ENCOUNTER — Encounter: Payer: Medicare Other | Admitting: Internal Medicine

## 2011-10-11 ENCOUNTER — Ambulatory Visit (INDEPENDENT_AMBULATORY_CARE_PROVIDER_SITE_OTHER): Payer: Medicare Other | Admitting: Internal Medicine

## 2011-10-11 ENCOUNTER — Encounter: Payer: Self-pay | Admitting: Internal Medicine

## 2011-10-11 VITALS — BP 136/92 | HR 96 | Temp 97.6°F | Ht 68.0 in | Wt 197.5 lb

## 2011-10-11 DIAGNOSIS — G609 Hereditary and idiopathic neuropathy, unspecified: Secondary | ICD-10-CM

## 2011-10-11 DIAGNOSIS — F329 Major depressive disorder, single episode, unspecified: Secondary | ICD-10-CM

## 2011-10-11 DIAGNOSIS — F17200 Nicotine dependence, unspecified, uncomplicated: Secondary | ICD-10-CM

## 2011-10-11 DIAGNOSIS — F3289 Other specified depressive episodes: Secondary | ICD-10-CM

## 2011-10-11 MED ORDER — AMITRIPTYLINE HCL 25 MG PO TABS: 25.0000 mg | ORAL_TABLET | Freq: Every day | ORAL | Status: DC

## 2011-10-11 NOTE — Patient Instructions (Addendum)
1. Follow up with your PCP in one month 2. Will adjust your amitriptyline 3. Please follow up with Dr. Ethelene Hal  At pain clinic

## 2011-10-12 ENCOUNTER — Encounter: Payer: Medicare Other | Admitting: Internal Medicine

## 2011-10-12 NOTE — Assessment & Plan Note (Signed)
Patient continue to smoke cig daily, smoking cessation is done.  - patient understands the risks of smoking, however, patient does not want to stop smoking.

## 2011-10-12 NOTE — Assessment & Plan Note (Signed)
The clinical manifestation is consistent with her chronic LLE neuropathy. She has no s/s of peripheral arterial disease.  - will continue her Gabapentin tx.  - will adjust Amitriptyline dosage to 25 mg po QHS. - patient is instructed to call Dr. Ethelene Hal office for follow up visit since her last shot on 09/29/11.

## 2011-10-12 NOTE — Progress Notes (Signed)
Patient ID: Laura [name], female   DOB: 05-13-1946, 66 y.o.   MRN: [id number]  Subjective:   Patient ID: Laura [name] female   DOB: 03/30/1946 66 y.o.   MRN: [id number]  HPI: Laura [name] is a 66 y.o. women with PMH of LLE peripheral neuropathy 2/2 remote neck and multiple lower back surgery who presents today with LLE numbness and pain.   Patient states that she has had chronic left LE numbness, tingling and weakness for at least 5 year since her lower back surgery. She has been taking Gabapentin and following up with her Orthopedic surgeon and Pain clinic doctor.   She reports that she has been compliant with her Gabapentin. She also receives shots to her lower back by Dr. Ethelene Hal at pain clinic, and her last shot was given on 09/29/11, which did not improve her lower LE numbness, tingling and pain. And she noticed some bulging veins on her left LE recently, especially at the end of day. She denies any leg pain associated with ambulation or activity. Denies Claudication. She would like to have vascular referral.  Of note, Amitriptyline was added as an additional treatment for her neuropathy in the beginning of March this year, and she stopped it after taking one tablet because it caused her excessive sleepiness and drowsiness.    Health maintenance 1. Pap smear: she states that she has not had pap smear for years.  She states that she has not had sexual relationship for years and she declined pap smear. 2. Tetanus and Zostavax reviewed, but she declined. 3. Her colonoscopy and mammogram uptodate.   Past Medical History  Diagnosis Date  . Lymphadenopathy     documented on chest CT scan 12/15/2005  . Substance abuse     tobacco  . Chronic kidney disease     ?renal cyst on MRI    Current Outpatient Prescriptions  Medication Sig Dispense Refill  . gabapentin (NEURONTIN) 600 MG tablet Take 1 tablet (600 mg total) by mouth 3 (three) times daily.  180 tablet  6  .  HYDROcodone-acetaminophen (VICODIN) 5-500 MG per tablet Take 1 tablet by mouth every 6 (six) hours as needed. pain      . pantoprazole (PROTONIX) 40 MG tablet Take 1 tablet (40 mg total) by mouth daily.  30 tablet  1  . amitriptyline (ELAVIL) 25 MG tablet Take 1 tablet (25 mg total) by mouth at bedtime.  30 tablet  0   Family History  Problem Relation Age of Onset  . Diabetes Father   . Hypertension Father    History   Social History  . Marital Status: Single    Spouse Name: N/A    Number of Children: 0  . Years of Education: N/A   Occupational History  .     Social History Main Topics  . Smoking status: Former Smoker    Types: Cigarettes    Quit date: 10/29/2010  . Smokeless tobacco: None  . Alcohol Use: Yes     social during holidays  . Drug Use: No  . Sexually Active: None   Other Topics Concern  . None   Social History Narrative   Patient's new Phone # [phone] and [phone].   Review of Systems:  No headache, fever, or sore throat. No shortness of breath or dyspnea on exertion. No chest pain, chest pressure or palpitation No nausea, vomiting, or abdominal pain. No melena, diarrhea or incontinence. No muscle weakness.  Denies depression. No appetite or weight changes.   Objective:  Physical Exam: Filed Vitals:   10/11/11 1438  BP: 136/92  Pulse: 96  Temp: 97.6 F (36.4 C)  TempSrc: Oral  Height: 5\' 8"  (1.727 m)  Weight: 197 lb 8 oz (89.585 kg)   General: alert, well-developed, and cooperative to examination.  Head: normocephalic and atraumatic.  Eyes: vision grossly intact, pupils equal, pupils round, pupils reactive to light, no injection and anicteric.  Mouth: pharynx pink and moist, no erythema, and no exudates.  Neck: supple, full ROM, no thyromegaly, no JVD, and no carotid bruits.  Lungs: normal respiratory effort, no accessory muscle use, normal breath sounds, no crackles, and no wheezes. Heart: normal rate, regular rhythm, no  murmur, no gallop, and no rub.  Abdomen: soft, non-tender, normal bowel sounds, no distention, no guarding, no rebound tenderness, no hepatomegaly, and no splenomegaly.  Msk: no joint swelling, no joint warmth, and no redness over joints.  Pulses: 2+ DP/PT pulses bilaterally, Cap refill < 2 sec. Extremities: No cyanosis, clubbing, edema. No  Neurologic: alert & oriented X3, cranial nerves II-XII intact. Left thigh strength 5/5. Left lower leg strength 4/5. Left toes numbness and decreased sensations noted. All other neuro assessment normal. All reflexes 2/4.  ( patient states that all of above are her chronic problem, and she uses a cane for ambulation). Skin: turgor normal and no rashes.  Psych: Oriented X3, memory intact for recent and remote, normally interactive, good eye contact, not anxious appearing, and not depressed appearing.   Assessment & Plan:

## 2011-10-12 NOTE — Assessment & Plan Note (Signed)
Patient denies a history of depression. She states that she experienced Grief period when her family members passed away 2 years ago. She does not think that she was ever depressed. She reports that she does not take any medications for depression.   Depression screening done. No s/s depression. No SI/HI.  - she declined medical treatment.  - I do not think that she has depression now. I will adjust and give her low dose Amitriptyline for her neuropathy.

## 2011-11-27 ENCOUNTER — Telehealth: Payer: Self-pay | Admitting: *Deleted

## 2011-11-27 NOTE — Telephone Encounter (Signed)
Pt called stating she had a RX from Dr Ethelene Hal, (pain clinic) stating what he wants her to have for Physical Therapy. She will bring this to the clinic and we can set up visits.  Hx: back pain.

## 2011-12-06 ENCOUNTER — Encounter: Payer: Self-pay | Admitting: Internal Medicine

## 2011-12-06 ENCOUNTER — Ambulatory Visit (INDEPENDENT_AMBULATORY_CARE_PROVIDER_SITE_OTHER): Payer: Medicare Other | Admitting: Internal Medicine

## 2011-12-06 VITALS — BP 134/92 | HR 89 | Temp 96.5°F | Ht 67.5 in | Wt 199.3 lb

## 2011-12-06 DIAGNOSIS — IMO0002 Reserved for concepts with insufficient information to code with codable children: Secondary | ICD-10-CM

## 2011-12-06 DIAGNOSIS — G609 Hereditary and idiopathic neuropathy, unspecified: Secondary | ICD-10-CM

## 2011-12-06 MED ORDER — AMITRIPTYLINE HCL 25 MG PO TABS: 25.0000 mg | ORAL_TABLET | Freq: Every day | ORAL | Status: AC

## 2011-12-06 NOTE — Patient Instructions (Addendum)
I have put in a referral for physical therapy. Tried taking the amitriptyline 25 mg at bedtime. Hopefully this will not make you as sleepy in the morning. Continue to take your pain medication as prescribed by Dr. Thad Ranger Return to see me in one year or sooner if needed.

## 2011-12-06 NOTE — Progress Notes (Signed)
Subjective:     Patient ID: Laura [name], female   DOB: [birth year], 66 y.o.   MRN: [id number]  HPI Patient history significant for chronic pain syndrome status post lumbar laminectomy (remote) followed by Dr. Rosalia Hammers most of [facility] orthopedics who recommends physical therapy. Patient presents today for physical therapy referral. In addition mention that when turning her torso or bend down, it feels "like something catches in my side".  This has been ongoing for months per pt reports, if not longer.  Review of Systems As per history of present illness    Objective:   Physical Exam  Constitutional: She is oriented to person, place, and time. She appears well-developed and well-nourished. No distress.  HENT:  Head: Normocephalic and atraumatic.  Eyes: Conjunctivae and EOM are normal. Pupils are equal, round, and reactive to light.  Neck: Normal range of motion. Neck supple.  Cardiovascular: Normal rate, regular rhythm and intact distal pulses.   Pulmonary/Chest: Effort normal and breath sounds normal.    Abdominal: Soft. Bowel sounds are normal. She exhibits no distension and no mass. There is no tenderness. There is no rebound and no guarding.  Musculoskeletal: Normal range of motion. She exhibits tenderness. She exhibits no edema.       Lumbar back: She exhibits tenderness.       Back:       Tender along right hip, s/p fall in February with negative imaging  Neurological: She is alert and oriented to person, place, and time. She has normal strength. A sensory deficit is present.       Reports chronic numbness over lateral left foot  Skin: Skin is warm.  Psychiatric: She has a normal mood and affect.       Assessment:     1. chronic back pain with neuropathy: followed by Ortho, as for vague side pain, likely not visceral ie gallbladder or musculoskeletal as pain not reproducible on palpation not associated with eating    Plan:     -Referral put in for physical  therapy -Encouraged patient to take reduced dose of amitriptyline 25 mg at bedtime -Continue Vicodin as prescribed per Dr. Ethelene Hal -pt to schedule follow-up with Ortho, encouraged pt to have side pain evaluated as well given likely skeletal nature to her symptom -Followup 1 year or sooner if needed

## 2011-12-12 ENCOUNTER — Telehealth: Payer: Self-pay | Admitting: *Deleted

## 2011-12-13 NOTE — Telephone Encounter (Signed)
Encounter opened in error. Dorie Rank, RN, 12/13/2011, 9:34A

## 2012-01-30 ENCOUNTER — Other Ambulatory Visit: Payer: Self-pay | Admitting: Internal Medicine

## 2012-01-30 DIAGNOSIS — M545 Low back pain, unspecified: Secondary | ICD-10-CM

## 2012-01-30 DIAGNOSIS — IMO0002 Reserved for concepts with insufficient information to code with codable children: Secondary | ICD-10-CM

## 2012-01-30 DIAGNOSIS — M961 Postlaminectomy syndrome, not elsewhere classified: Secondary | ICD-10-CM

## 2012-01-30 DIAGNOSIS — M5416 Radiculopathy, lumbar region: Secondary | ICD-10-CM

## 2012-02-01 ENCOUNTER — Ambulatory Visit: Payer: Medicare Other | Attending: Internal Medicine | Admitting: Physical Therapy

## 2012-02-01 DIAGNOSIS — R5381 Other malaise: Secondary | ICD-10-CM | POA: Insufficient documentation

## 2012-02-01 DIAGNOSIS — IMO0001 Reserved for inherently not codable concepts without codable children: Secondary | ICD-10-CM | POA: Insufficient documentation

## 2012-02-01 DIAGNOSIS — R279 Unspecified lack of coordination: Secondary | ICD-10-CM | POA: Insufficient documentation

## 2012-02-01 DIAGNOSIS — Z9181 History of falling: Secondary | ICD-10-CM | POA: Insufficient documentation

## 2012-02-01 DIAGNOSIS — R262 Difficulty in walking, not elsewhere classified: Secondary | ICD-10-CM | POA: Insufficient documentation

## 2012-02-06 ENCOUNTER — Ambulatory Visit: Payer: Medicare Other | Admitting: Physical Therapy

## 2012-02-14 ENCOUNTER — Ambulatory Visit: Payer: Medicare Other | Admitting: Physical Therapy

## 2012-02-16 ENCOUNTER — Ambulatory Visit: Payer: Medicare Other | Admitting: Physical Therapy

## 2012-02-21 ENCOUNTER — Telehealth: Payer: Self-pay | Admitting: *Deleted

## 2012-02-21 ENCOUNTER — Ambulatory Visit: Payer: Medicare Other | Admitting: Physical Therapy

## 2012-02-21 NOTE — Telephone Encounter (Signed)
Pt called to inform you that she cancelled all her appointments with neuro rehab. She states the physical therapist questioned her about her meds, stated pt with her skin discoloration taking Neurontin are alcoholics!! Pt states she doesn't even drink. She felt very uncomfortable there. She will return to water aerobics. Pt # [id number]

## 2012-02-23 ENCOUNTER — Ambulatory Visit: Payer: Medicare Other | Admitting: Physical Therapy

## 2012-02-28 ENCOUNTER — Ambulatory Visit: Payer: Medicare Other | Admitting: Physical Therapy

## 2012-03-01 ENCOUNTER — Ambulatory Visit: Payer: Medicare Other | Admitting: Rehabilitative and Restorative Service Providers"

## 2012-05-27 ENCOUNTER — Other Ambulatory Visit: Payer: Self-pay | Admitting: Internal Medicine

## 2012-05-27 ENCOUNTER — Telehealth: Payer: Self-pay | Admitting: Licensed Clinical Social Worker

## 2012-05-27 NOTE — Telephone Encounter (Signed)
CSW received phone call from pt's PCS agency.  Agency states they are requesting additional hours due to change of status with [name].  Pt has appt on 05/30/12 with PCP.

## 2012-05-30 ENCOUNTER — Ambulatory Visit: Payer: Medicare Other | Admitting: Internal Medicine

## 2012-08-26 ENCOUNTER — Other Ambulatory Visit: Payer: Self-pay | Admitting: Internal Medicine

## 2012-09-23 ENCOUNTER — Ambulatory Visit: Payer: Medicare Other | Admitting: Internal Medicine

## 2012-12-30 ENCOUNTER — Ambulatory Visit (INDEPENDENT_AMBULATORY_CARE_PROVIDER_SITE_OTHER): Payer: PRIVATE HEALTH INSURANCE | Admitting: Internal Medicine

## 2012-12-30 ENCOUNTER — Encounter: Payer: Self-pay | Admitting: Internal Medicine

## 2012-12-30 VITALS — BP 135/92 | HR 89 | Temp 98.8°F | Ht 68.0 in | Wt 195.2 lb

## 2012-12-30 DIAGNOSIS — F17200 Nicotine dependence, unspecified, uncomplicated: Secondary | ICD-10-CM

## 2012-12-30 DIAGNOSIS — G609 Hereditary and idiopathic neuropathy, unspecified: Secondary | ICD-10-CM

## 2012-12-30 DIAGNOSIS — Z0000 Encounter for general adult medical examination without abnormal findings: Secondary | ICD-10-CM

## 2012-12-30 DIAGNOSIS — M545 Low back pain: Secondary | ICD-10-CM

## 2012-12-30 DIAGNOSIS — M961 Postlaminectomy syndrome, not elsewhere classified: Secondary | ICD-10-CM

## 2012-12-30 DIAGNOSIS — E785 Hyperlipidemia, unspecified: Secondary | ICD-10-CM

## 2012-12-30 MED ORDER — GABAPENTIN 600 MG PO TABS: ORAL_TABLET | ORAL | Status: DC

## 2012-12-30 NOTE — Progress Notes (Signed)
  Subjective:    Patient ID: Laura [name], female    DOB: 14-Oct-1945, 67 y.o.   MRN: [id number]  HPI  67 yo AA female with hx significant for chronic back pain, postlaminectomy syndrome and peripheral neuropathy presents for annual exam.  States that she is still having back pain, left leg pain and left hand neuropathy that is managed by Dr. Shon Baton and Dr. Ethelene Hal of [facility].  Only medications is hydrocodone and Gabapentin. Would like cholesterol checked today.  States that she still smokes 2-3 cigarettes a day after meals and is NOT motivated to stop at this point.     Review of Systems  Constitutional: Negative for fever and fatigue.  HENT: Negative for congestion.   Eyes: Negative for visual disturbance.  Respiratory: Negative for cough, chest tightness and shortness of breath.   Cardiovascular: Negative for chest pain, palpitations and leg swelling.  Gastrointestinal: Negative for diarrhea and constipation.  Musculoskeletal: Positive for back pain and arthralgias. Negative for joint swelling.  Skin: Negative for rash.  Neurological: Negative for weakness.       Objective:   Physical Exam  Constitutional: She is oriented to person, place, and time. She appears well-developed and well-nourished. No distress.  HENT:  Head: Normocephalic and atraumatic.  Eyes: Conjunctivae and EOM are normal. Pupils are equal, round, and reactive to light.  Neck: Normal range of motion. Neck supple. No thyromegaly present.  Cardiovascular: Normal rate, regular rhythm, normal heart sounds and intact distal pulses.   Pulmonary/Chest: Effort normal and breath sounds normal.  Abdominal: Soft. Bowel sounds are normal.  Musculoskeletal:       Left lower leg: She exhibits tenderness and bony tenderness. She exhibits no swelling, no edema and no deformity.  Neurological: She is alert and oriented to person, place, and time.  Skin: Skin is warm and dry.  Psychiatric: She has a normal mood  and affect. Her behavior is normal. Judgment and thought content normal.          Assessment & Plan:  1. Back pain with postlaminectomy syndrome: manged per ortho, last nerve root block performed by Dr. Ethelene Hal last fall. She is managed on hydrocodone 10-325 mg 1 tab bid prn prescribed by Dr. Ethelene Hal. -pt to f/u with Dr. Ethelene Hal in 2 months  2. Hyperlipidemia: discussed with pt that smoking cessation would lower her risk of heart attack more than starting a cholesterol mediction -check lipid panel--> will advise statin therapy given if LDL still elevated and HDL low as previously noted  3. Tobacco Abuse: counseled for cessation

## 2012-12-30 NOTE — Assessment & Plan Note (Signed)
  Assessment: Progress toward smoking cessation:  smoking the same amount Barriers to progress toward smoking cessation:  lack of motivation to quit Comments: counseled for cessation  Plan: Instruction/counseling given:  I counseled patient on the dangers of tobacco use, advised patient to stop smoking, and reviewed strategies to maximize success. Educational resources provided:  [employer]) brochure Self management tools provided:    Medications to assist with smoking cessation:  None Patient agreed to the following self-care plans for smoking cessation: go to the [employer] (www.quitlinenc.com);call [employer] (1-800-QUIT-NOW)  Other plans:

## 2012-12-31 DIAGNOSIS — Z0000 Encounter for general adult medical examination without abnormal findings: Secondary | ICD-10-CM

## 2012-12-31 HISTORY — DX: Encounter for general adult medical examination without abnormal findings: Z00.00

## 2012-12-31 LAB — LIPID PANEL
Cholesterol: 180 mg/dL (ref 0–200)
HDL: 32 mg/dL — ABNORMAL LOW (ref >39)
LDL Cholesterol: 130 mg/dL — ABNORMAL HIGH (ref 0–99)
Total CHOL/HDL Ratio: 5.6 ratio
Triglycerides: 89 mg/dL (ref <150)
VLDL: 18 mg/dL (ref 0–40)

## 2012-12-31 LAB — BASIC METABOLIC PANEL
CO2: 26 mEq/L (ref 19–32)
Calcium: 9.7 mg/dL (ref 8.4–10.5)
Sodium: 140 mEq/L (ref 135–145)

## 2012-12-31 NOTE — Assessment & Plan Note (Signed)
Referral for mammogram today, declines PAP smear

## 2013-01-13 NOTE — Progress Notes (Signed)
Case discussed with Dr. Schooler (at time of visit, soon after the resident saw the patient).  We reviewed the resident's history and exam and pertinent patient test results.  I agree with the assessment, diagnosis, and plan of care documented in the resident's note. 

## 2013-03-07 ENCOUNTER — Encounter: Payer: Self-pay | Admitting: Internal Medicine

## 2013-03-07 ENCOUNTER — Ambulatory Visit: Payer: PRIVATE HEALTH INSURANCE | Admitting: Internal Medicine

## 2013-08-04 ENCOUNTER — Encounter (HOSPITAL_COMMUNITY): Payer: Self-pay | Admitting: Emergency Medicine

## 2013-08-04 ENCOUNTER — Telehealth: Payer: Self-pay | Admitting: *Deleted

## 2013-08-04 ENCOUNTER — Encounter: Payer: Self-pay | Admitting: Internal Medicine

## 2013-08-04 ENCOUNTER — Emergency Department (HOSPITAL_COMMUNITY): Payer: No Typology Code available for payment source

## 2013-08-04 ENCOUNTER — Emergency Department (HOSPITAL_COMMUNITY)
Admission: EM | Admit: 2013-08-04 | Discharge: 2013-08-04 | Disposition: A | Discharge status: Home / Self Care | Payer: No Typology Code available for payment source | Attending: Emergency Medicine | Admitting: Emergency Medicine

## 2013-08-04 ENCOUNTER — Ambulatory Visit (INDEPENDENT_AMBULATORY_CARE_PROVIDER_SITE_OTHER): Payer: PRIVATE HEALTH INSURANCE | Admitting: Internal Medicine

## 2013-08-04 VITALS — BP 151/102 | HR 87 | Temp 97.2°F | Ht 69.0 in | Wt 207.9 lb

## 2013-08-04 DIAGNOSIS — K219 Gastro-esophageal reflux disease without esophagitis: Secondary | ICD-10-CM

## 2013-08-04 DIAGNOSIS — S0993XA Unspecified injury of face, initial encounter: Secondary | ICD-10-CM | POA: Insufficient documentation

## 2013-08-04 DIAGNOSIS — IMO0002 Reserved for concepts with insufficient information to code with codable children: Secondary | ICD-10-CM | POA: Insufficient documentation

## 2013-08-04 DIAGNOSIS — Y92410 Unspecified street and highway as the place of occurrence of the external cause: Secondary | ICD-10-CM | POA: Insufficient documentation

## 2013-08-04 DIAGNOSIS — R030 Elevated blood-pressure reading, without diagnosis of hypertension: Secondary | ICD-10-CM

## 2013-08-04 DIAGNOSIS — M542 Cervicalgia: Secondary | ICD-10-CM

## 2013-08-04 DIAGNOSIS — M549 Dorsalgia, unspecified: Secondary | ICD-10-CM

## 2013-08-04 DIAGNOSIS — Z79899 Other long term (current) drug therapy: Secondary | ICD-10-CM | POA: Insufficient documentation

## 2013-08-04 DIAGNOSIS — F17200 Nicotine dependence, unspecified, uncomplicated: Secondary | ICD-10-CM | POA: Insufficient documentation

## 2013-08-04 DIAGNOSIS — R059 Cough, unspecified: Secondary | ICD-10-CM

## 2013-08-04 DIAGNOSIS — Z0000 Encounter for general adult medical examination without abnormal findings: Secondary | ICD-10-CM

## 2013-08-04 DIAGNOSIS — R05 Cough: Secondary | ICD-10-CM

## 2013-08-04 DIAGNOSIS — Z23 Encounter for immunization: Secondary | ICD-10-CM

## 2013-08-04 DIAGNOSIS — R053 Chronic cough: Secondary | ICD-10-CM | POA: Insufficient documentation

## 2013-08-04 DIAGNOSIS — I10 Essential (primary) hypertension: Secondary | ICD-10-CM

## 2013-08-04 DIAGNOSIS — S199XXA Unspecified injury of neck, initial encounter: Principal | ICD-10-CM

## 2013-08-04 DIAGNOSIS — Y9389 Activity, other specified: Secondary | ICD-10-CM | POA: Insufficient documentation

## 2013-08-04 DIAGNOSIS — N189 Chronic kidney disease, unspecified: Secondary | ICD-10-CM | POA: Insufficient documentation

## 2013-08-04 DIAGNOSIS — Z299 Encounter for prophylactic measures, unspecified: Secondary | ICD-10-CM

## 2013-08-04 DIAGNOSIS — Z9889 Other specified postprocedural states: Secondary | ICD-10-CM | POA: Insufficient documentation

## 2013-08-04 MED ORDER — AMLODIPINE BESYLATE 5 MG PO TABS: 5.0000 mg | ORAL_TABLET | Freq: Every day | ORAL | Status: DC

## 2013-08-04 MED ORDER — HYDROCODONE-ACETAMINOPHEN 5-325 MG PO TABS
1.0000 | ORAL_TABLET | Freq: Once | ORAL | Status: AC
  Administered 2013-08-04: 1 via ORAL
  Filled 2013-08-04: qty 1

## 2013-08-04 MED ORDER — PANTOPRAZOLE SODIUM 40 MG PO TBEC: 40.0000 mg | DELAYED_RELEASE_TABLET | Freq: Every day | ORAL | Status: DC

## 2013-08-04 MED ORDER — BENZONATATE 100 MG PO CAPS: 100.0000 mg | ORAL_CAPSULE | Freq: Four times a day (QID) | ORAL | Status: DC | PRN

## 2013-08-04 NOTE — Assessment & Plan Note (Addendum)
Patient's blood pressure is 160/99 mmHg in clinic today. She is not on any medications for blood pressure. Chart review showed her blood pressure has been fluctuating in the past year, with elevated diastolic blood pressure most of the time. Patient refused starting new medications initially and would like to talk to her PCP first. But she called back in afternoon, satting that her blood pressure is still elevated at 151/102 mmHg at home. She wants to be treated.    -will start low dose of amlodipine: 5 mg daily -Will reevaluate in one month.

## 2013-08-04 NOTE — ED Notes (Signed)
Returned from radiology. 
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
## 2013-08-04 NOTE — Assessment & Plan Note (Addendum)
Patient's cough is most likely caused by combination of GERD and smoking. He has typical symptoms of GERD, including acid reflex, and cough at night. Patient continued to smoke one to 2 cigarettes per day. He used to smoke 4-5 cigarettes per day for more than 20 years. Recently she cut down on her smoking, but is not ready to quit smoking yet. Differential diagnoses include, but unlikely, COPD exacerbation (her lung auscultation is clear bilaterally, oxygen saturation is 99% on room air), and pulmonary embolism (no chest pain or signs ofDVT; Well's score is low probability).  -will restart Protonix for GERD -Will treat cough symptomatically with Tessalon -treat her back muscle pain with tylenol

## 2013-08-04 NOTE — ED Notes (Signed)
Pt in xray

## 2013-08-04 NOTE — Assessment & Plan Note (Signed)
-  Mammogram was ordered before, she missed appointment in the past. [facility] -gaive flu shot and pneumococcal vaccination today.

## 2013-08-04 NOTE — Discharge Instructions (Signed)
Call for a follow up appointment with a Family or Primary Care Provider.  Follow up with your spine specialist. Return if Symptoms worsen.   Ice the affected area 3-4 times daily as needed. Take medication as prescribed.

## 2013-08-04 NOTE — Patient Instructions (Signed)
1. Please start taking Protonix 40 mg daily for your acid reflux.  2. Please take Tessalon for cough.  3. If you have worsening of your symptoms or new symptoms arise, please call the clinic ([phone]), or go to the ER immediately if symptoms are severe.  Please bring in all your medication bottles with you in next visit.

## 2013-08-04 NOTE — ED Notes (Signed)
Pt refused discharge vital signs

## 2013-08-04 NOTE — Progress Notes (Signed)
Patient ID: Laura [name], female   DOB: [birth year], 68 y.o.   MRN: [id number] Subjective:   Patient ID: Laura [name] female   DOB: [birth year] 68 y.o.   MRN: [id number]  CC:   Acute visit due to cough HPI:  [name] is a 68 y.o. lady with past medical history as outlined below, who presents for a followup visit today.  1. Cough: Patient reports that she has been coughing in the past 2 weeks. She coughs up white-colored sputum with mild shortness of breath which has been going on for more than one month. She does not have fever, chills, chest pain. She said when she blowed her nose, she noticed streaks of blood twice. Today she does not have nosebleeding. Her shortness of breath is minimal.  Her cough is worse in the night. Sometimes she had regurgitation of fluid in the night. She states that she has mild muscle soreness over right side of her back due to cough. Occasionally she has heartburn. She used to take Protonix for GERD, which was discontinued for a long time. She does not have tenderness over the calf. She has no recent long distance traveling history.   2. Elevated blood pressure: Patient's blood pressure is 160/99 mmHg today. She is not on any medications for blood pressure. Chart review showed her blood pressure has fluctuating in the past year, with elevated diastolic blood pressure in most of the times. Patient does not have chest pain, palpitation or leg edema.  ROS: Patient has chronic back pain, cough and heartburn. Denies fever, chills, fatigue, cough, chest pain, abdominal pain, diarrhea, constipation, dysuria, urgency, frequency, hematuria, or leg swelling.    Past Medical History  Diagnosis Date  . Lymphadenopathy     documented on chest CT scan 12/15/2005  . Substance abuse     tobacco  . Chronic kidney disease     ?renal cyst on MRI    Current Outpatient Prescriptions  Medication Sig Dispense Refill  . amLODipine (NORVASC) 5 MG tablet Take 1 tablet (5  mg total) by mouth daily.  30 tablet  1  . benzonatate (TESSALON PERLES) 100 MG capsule Take 1 capsule (100 mg total) by mouth every 6 (six) hours as needed for cough.  30 capsule  1  . gabapentin (NEURONTIN) 600 MG tablet TAKE 1 TABLET (600 MG TOTAL) BY MOUTH 3 (THREE) TIMES DAILY.  180 tablet  3  . pantoprazole (PROTONIX) 40 MG tablet Take 1 tablet (40 mg total) by mouth daily.  30 tablet  0   No current facility-administered medications for this visit.   Family History  Problem Relation Age of Onset  . Diabetes Father   . Hypertension Father    History   Social History  . Marital Status: Single    Spouse Name: N/A    Number of Children: 0  . Years of Education: N/A   Occupational History  .     Social History Main Topics  . Smoking status: Current Every Day Smoker -- 0.10 packs/day    Types: Cigarettes    Last Attempt to Quit: 10/29/2010  . Smokeless tobacco: None     Comment: down to 1-2 per day  . Alcohol Use: Yes     Comment: social during holidays  . Drug Use: No  . Sexual Activity: None   Other Topics Concern  . None   Social History Narrative   Patient's new Phone # [phone] and [phone].    Review of  Systems: Full 14-point review of systems otherwise negative. See HPI.   Objective:  Physical Exam: Filed Vitals:   08/04/13 1035 08/04/13 1139  BP: 160/99 151/102  Pulse: 100 87  Temp: 97.2 F (36.2 C)   TempSrc: Oral   Height: 5\' 9"  (1.753 m)   Weight: 207 lb 14.4 oz (94.303 kg)   SpO2: 99%    Constitutional: Vital signs reviewed.  Patient is a well-developed and well-nourished, in no acute distress and cooperative with exam.   HEENT:  Head: Normocephalic and atraumatic Mouth: no erythema or exudates, MMM Eyes: PERRL, EOMI, conjunctivae normal, No scleral icterus.  Neck: Supple, Trachea midline normal ROM, No JVD  Cardiovascular: RRR, S1 normal, S2 normal, no MRG, pulses symmetric and intact bilaterally Pulmonary/Chest: CTAB, no wheezes, rales,  or rhonchi Abdominal: Soft. Non-tender, non-distended, bowel sounds are normal, no masses, organomegaly, or guarding present.  GU: no CVA tenderness Musculoskeletal: No joint deformities, erythema, or stiffness, ROM full. There is mild tenderness over the right upper back muscles.  Extremities: No leg edema Hematology: no cervical, inginal, or axillary adenopathy.  Neurological: A&O x3, Strength is normal and symmetric bilaterally, cranial nerve II-XII are grossly intact, no focal motor deficit, sensory intact to light touch bilaterally.  Skin: Warm, dry and intact. No rash, cyanosis, or clubbing.  Psychiatric: Normal mood and affect. No suicidal or homicidal ideation.  Assessment & Plan:

## 2013-08-04 NOTE — Telephone Encounter (Signed)
Pt seen in clinic today, she calls back to report BP up to 153/102 and c/o headache.  She states headache is 6/10, worse that this morning. She wants to know what to do about the headache.   Pt # [id number]  Please advise
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
## 2013-08-04 NOTE — Telephone Encounter (Signed)
I talked with Dr Blaine Hamper and he will order BP med. Pt informed and asked to make a f/u appointment in one month. She is to take tylenol for headache.  Pt voices understanding
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
## 2013-08-06 NOTE — ED Provider Notes (Signed)
Medical screening examination/treatment/procedure(s) were performed by non-physician practitioner and as supervising physician I was immediately available for consultation/collaboration.  EKG Interpretation   None        Babette Relic, MD 08/06/13 [phone]

## 2013-08-08 NOTE — Progress Notes (Signed)
Case discussed with Dr. Blaine Hamper at the time of the visit.  We reviewed the resident's history and exam and pertinent patient test results.  I agree with the assessment, diagnosis, and plan of care documented in the resident's note.

## 2013-09-29 ENCOUNTER — Other Ambulatory Visit: Payer: Self-pay | Admitting: *Deleted

## 2013-09-30 MED ORDER — PANTOPRAZOLE SODIUM 40 MG PO TBEC: 40.0000 mg | DELAYED_RELEASE_TABLET | Freq: Every day | ORAL | Status: DC

## 2013-10-06 ENCOUNTER — Ambulatory Visit (INDEPENDENT_AMBULATORY_CARE_PROVIDER_SITE_OTHER): Payer: PRIVATE HEALTH INSURANCE | Admitting: Internal Medicine

## 2013-10-06 ENCOUNTER — Encounter: Payer: Self-pay | Admitting: Internal Medicine

## 2013-10-06 VITALS — BP 142/80 | HR 76 | Temp 97.0°F | Ht 69.0 in | Wt 208.8 lb

## 2013-10-06 DIAGNOSIS — Z1239 Encounter for other screening for malignant neoplasm of breast: Secondary | ICD-10-CM | POA: Insufficient documentation

## 2013-10-06 DIAGNOSIS — I10 Essential (primary) hypertension: Secondary | ICD-10-CM

## 2013-10-06 DIAGNOSIS — Z0000 Encounter for general adult medical examination without abnormal findings: Secondary | ICD-10-CM

## 2013-10-06 DIAGNOSIS — F17200 Nicotine dependence, unspecified, uncomplicated: Secondary | ICD-10-CM

## 2013-10-06 DIAGNOSIS — N393 Stress incontinence (female) (male): Secondary | ICD-10-CM

## 2013-10-06 DIAGNOSIS — N3946 Mixed incontinence: Secondary | ICD-10-CM | POA: Insufficient documentation

## 2013-10-06 MED ORDER — OXYBUTYNIN CHLORIDE 5 MG PO TABS: 5.0000 mg | ORAL_TABLET | Freq: Three times a day (TID) | ORAL | Status: DC

## 2013-10-06 NOTE — Progress Notes (Signed)
   Subjective:    Patient ID: Laura [name], female    DOB: 1945-07-19, 68 y.o.   MRN: [id number]  HPI  68 yo female recently started on amlodipine at last visit 08/04/2013 whereby bp was 145/93.  States that she became ill with stumbling which she attributes to the amlodipine thus has stopped taking the medication after several days.  Declining further bp meds today but states that she would consider fluid pill (ie HCTZ or chlorthalidone) if she gets something for her "bladder leakage". Reports that she has urine leakage when coughing or sneezing for  "years".  Review of Systems  Constitutional: Negative.   HENT: Positive for rhinorrhea.        Runny nose last week  Eyes: Negative.   Respiratory: Negative for shortness of breath.   Cardiovascular: Negative for chest pain.  Gastrointestinal: Negative.   Genitourinary: Negative for dysuria and pelvic pain.       Incontinence when sneeze or cough  Allergic/Immunologic: Positive for environmental allergies.       Feels like she may be developing seasonal allergies  Neurological: Positive for dizziness. Negative for seizures, syncope, speech difficulty, light-headedness and numbness.       Dizziness and stumbling when using amlodipine  Psychiatric/Behavioral: Negative.        Objective:   Physical Exam  Constitutional: She is oriented to person, place, and time. She appears well-developed and well-nourished. No distress.  HENT:  Head: Normocephalic and atraumatic.  Right Ear: External ear normal.  Left Ear: External ear normal.  Nose: Nose normal.  Eyes: Conjunctivae and EOM are normal. Pupils are equal, round, and reactive to light.  Neck: Normal range of motion. Neck supple. No thyromegaly present.  Cardiovascular: Normal rate, regular rhythm and normal heart sounds.   Pulmonary/Chest: Effort normal and breath sounds normal. She has no wheezes.  Abdominal: Soft. Bowel sounds are normal. There is no tenderness.  Musculoskeletal:  Normal range of motion. She exhibits no edema.  Neurological: She is alert and oriented to person, place, and time. She has normal reflexes.  Skin: Skin is warm and dry.  Psychiatric: She has a normal mood and affect. Her behavior is normal. Judgment and thought content normal.          Assessment & Plan:  See separate problem-list charting for details:    Htn: bp 148/91 then 142/80 on recheck today with goal <150/90. Hx signifianct for mildly elevated diastolic bp 03-212 last several readings. -d/c amlodipine -consider hctz or chlorthalidone at f/u in 2 weeks after ditropan trial  Stress incontinence: -ditropran

## 2013-10-06 NOTE — Assessment & Plan Note (Signed)
  Assessment: Progress toward smoking cessation:  smoking the same amount Barriers to progress toward smoking cessation:    Comments:has no desire to quit  Plan: Instruction/counseling given:  I counseled patient on the dangers of tobacco use, advised patient to stop smoking, and reviewed strategies to maximize success. Educational resources provided:  [employer] account manager) brochure Self management tools provided:    Medications to assist with smoking cessation:  None Patient agreed to the following self-care plans for smoking cessation:    Other plans: cont to encourage cessation

## 2013-10-06 NOTE — Assessment & Plan Note (Signed)
BP Readings from Last 3 Encounters:  10/06/13 142/80  08/04/13 145/93  08/04/13 151/102    Lab Results  Component Value Date   NA 140 12/30/2012   K 4.3 12/30/2012   CREATININE 0.97 12/30/2012    Assessment: Blood pressure control: controlled Progress toward BP goal:  at goal Comments: recheck 142/80, stopped amlodipine secondary to side effects  Plan: Medications:  diet control for now Educational resources provided: brochure Self management tools provided: home blood pressure logbook Other plans: reassess on f/u after trial of urinary incontinence meds, may benefit from hctz or chlorthalidone

## 2013-10-06 NOTE — Patient Instructions (Addendum)
General Instructions:  You blood pressure is at goal today. We would like to keep a close eye on it and have you return in 2 weeks. Until then, take the medication for your bladder and do the bladder exercises as instructed and included in this packet of information. If the lifestyle changes, exercises and medicine do not help, we will refer you to a bladder doctor Tourist information centre manager).   Treatment Goals:  Goals (1 Years of Data) as of 10/06/13         As of Today As of Today 08/04/13 08/04/13 08/04/13     Blood Pressure    . Blood Pressure < 150/90  142/80 148/91 145/93 151/102 160/99      Progress Toward Treatment Goals:  Treatment Goal 10/06/2013  Blood pressure at goal  Stop smoking smoking the same amount    Self Care Goals & Plans:  Self Care Goal 10/06/2013  Manage my medications take my medicines as prescribed; bring my medications to every visit; refill my medications on time  Monitor my health -  Eat healthy foods drink diet soda or water instead of juice or soda; eat more vegetables; eat foods that are low in salt; eat baked foods instead of fried foods; eat fruit for snacks and desserts  Be physically active -  Stop smoking -    No flowsheet data found.   Care Management & Community Referrals:  No flowsheet data found.    Urinary Incontinence Urinary incontinence is the involuntary loss of urine from your bladder. CAUSES  There are many causes of urinary incontinence. They include:  Medicines.  Infections.  Prostatic enlargement, leading to overflow of urine from your bladder.  Surgery.  Neurological diseases.  Emotional factors. SIGNS AND SYMPTOMS Urinary Incontinence can be divided into four types: 1. Urge incontinence. Urge incontinence is the involuntary loss of urine before you have the opportunity to go to the bathroom. There is a sudden urge to void but not enough time to reach a bathroom. 2. Stress incontinence. Stress incontinence is the sudden loss  of urine with any activity that forces urine to pass. It is commonly caused by anatomical changes to the pelvis and sphincter areas of your body. 3. Overflow incontinence. Overflow incontinence is the loss of urine from an obstructed opening to your bladder. This results in a backup of urine and a resultant buildup of pressure within the bladder. When the pressure within the bladder exceeds the closing pressure of the sphincter, the urine overflows, which causes incontinence, similar to water overflowing a dam. 4. Total incontinence. Total incontinence is the loss of urine as a result of the inability to store urine within your bladder. DIAGNOSIS  Evaluating the cause of incontinence may require:  A thorough and complete medical and obstetric history.  A complete physical exam.  Laboratory tests such as a urine culture and sensitivities. When additional tests are indicated, they can include:  An ultrasound exam.  Kidney and bladder X-rays.  Cystoscopy. This is an exam of the bladder using a narrow scope.  Urodynamic testing to test the nerve function to the bladder and sphincter areas. TREATMENT  Treatment for urinary incontinence depends on the cause:  For urge incontinence caused by a bacterial infection, antibiotics will be prescribed. If the urge incontinence is related to medicines you take, your health care provider may have you change the medicine.  For stress incontinence, surgery to re-establish anatomical support to the bladder or sphincter, or both, will often correct the condition.  For overflow incontinence caused by an enlarged prostate, an operation to open the channel through the enlarged prostate will allow the flow of urine out of the bladder. In women with fibroids, a hysterectomy may be recommended.  For total incontinence, surgery on your urinary sphincter may help. An artificial urinary sphincter (an inflatable cuff placed around the urethra) may be required. In  women who have developed a hole-like passage between their bladder and vagina (vesicovaginal fistula), surgery to close the fistula often is required. HOME CARE INSTRUCTIONS  Normal daily hygiene and the use of pads or adult diapers that are changed regularly will help prevent odors and skin damage.  Avoid caffeine. It can overstimulate your bladder.  Use the bathroom regularly. Try about every 2 3 hours to go to the bathroom, even if you do not feel the need to do so. Take time to empty your bladder completely. After urinating, wait a minute. Then try to urinate again.  For causes involving nerve dysfunction, keep a log of the medicines you take and a journal of the times you go to the bathroom. SEEK MEDICAL CARE IF:  You experience worsening of pain instead of improvement in pain after your procedure.  Your incontinence becomes worse instead of better. SEE IMMEDIATE MEDICAL CARE IF:  You experience fever or shaking chills.  You are unable to pass your urine.  You have redness spreading into your groin or down into your thighs. MAKE SURE YOU:   Understand these instructions.   Will watch your condition.  Will get help right away if you are not doing well or get worse. Document Released: 07/13/2004 Document Revised: 03/26/2013 Document Reviewed: 11/12/2012 [facility] Patient Information 2014 [place].  Kegel Exercises The goal of Kegel exercises is to isolate and exercise your pelvic floor muscles. These muscles act as a hammock that supports the rectum, vagina, small intestine, and uterus. As the muscles weaken, the hammock sags and these organs are displaced from their normal positions. Kegel exercises can strengthen your pelvic floor muscles and help you to improve bladder and bowel control, improve sexual response, and help reduce many problems and some discomfort during pregnancy. Kegel exercises can be done anywhere and at any time. HOW TO PERFORM KEGEL  EXERCISES 1. Locate your pelvic floor muscles. To do this, squeeze (contract) the muscles that you use when you try to stop the flow of urine. You will feel a tightness in the vaginal area (women) and a tight lift in the rectal area (men and women). 2. When you begin, contract your pelvic muscles tight for 2 5 seconds, then relax them for 2 5 seconds. This is one set. Do 4 5 sets with a short pause in between. 3. Contract your pelvic muscles for 8 10 seconds, then relax them for 8 10 seconds. Do 4 5 sets. If you cannot contract your pelvic muscles for 8 10 seconds, try 5 7 seconds and work your way up to 8 10 seconds. Your goal is 4 5 sets of 10 contractions each day. Keep your stomach, buttocks, and legs relaxed during the exercises. Perform sets of both short and long contractions. Vary your positions. Perform these contractions 3 4 times per day. Perform sets while you are:   Lying in bed in the morning.  Standing at lunch.  Sitting in the late afternoon.  Lying in bed at night. You should do 40 50 contractions per day. Do not perform more Kegel exercises per day than recommended. Overexercising can cause  muscle fatigue. Continue these exercises for for at least 15 20 weeks or as directed by your caregiver. Document Released: 05/22/2012 Document Reviewed: 05/22/2012 [facility] Patient Information 2014 [place], [place].   Oxybutynin tablets What is this medicine? OXYBUTYNIN (ox i BYOO ti nin) is used to treat overactive bladder. This medicine reduces the amount of bathroom visits. It may also help to control wetting accidents. This medicine may be used for other purposes; ask your health care provider or pharmacist if you have questions. COMMON BRAND NAME(S): Ditropan What should I tell my health care provider before I take this medicine? They need to know if you have any of these conditions: -dementia -difficulty passing urine -glaucoma -intestinal obstruction -kidney disease -liver  disease -an unusual or allergic reaction to oxybutynin, other medicines, foods, dyes, or preservatives -pregnant or trying to get pregnant -breast-feeding How should I use this medicine? Take this medicine by mouth with a glass of water. Follow the directions on the prescription label. You can take this medicine with or without food. Take your medicine at regular intervals. Do not take your medicine more often than directed. Talk to your pediatrician regarding the use of this medicine in children. Special care may be needed. While this drug may be prescribed for children as young as 5 years for selected conditions, precautions do apply. Overdosage: If you think you have taken too much of this medicine contact a poison control center or emergency room at once. NOTE: This medicine is only for you. Do not share this medicine with others. What if I miss a dose? If you miss a dose, take it as soon as you can. If it is almost time for your next dose, take only that dose. Do not take double or extra doses. What may interact with this medicine? -antihistamines for allergy, cough and cold -atropine -certain medicines for bladder problems like oxybutynin, tolterodine -certain medicines for Parkinson's disease like benztropine, trihexyphenidyl -certain medicines for stomach problems like dicyclomine, hyoscyamine -certain medicines for travel sickness like scopolamine -clarithromycin -erythromycin -ipratropium -medicines for fungal infections, like fluconazole, itraconazole, ketoconazole or voriconazole This list may not describe all possible interactions. Give your health care provider a list of all the medicines, herbs, non-prescription drugs, or dietary supplements you use. Also tell them if you smoke, drink alcohol, or use illegal drugs. Some items may interact with your medicine. What should I watch for while using this medicine? It may take a few weeks to notice the full benefit from this  medicine. You may need to limit your intake tea, coffee, caffeinated sodas, and alcohol. These drinks may make your symptoms worse. You may get drowsy or dizzy. Do not drive, use machinery, or do anything that needs mental alertness until you know how this medicine affects you. Do not stand or sit up quickly, especially if you are an older patient. This reduces the risk of dizzy or fainting spells. Alcohol may interfere with the effect of this medicine. Avoid alcoholic drinks. Your mouth may get dry. Chewing sugarless gum or sucking hard candy, and drinking plenty of water may help. Contact your doctor if the problem does not go away or is severe. This medicine may cause dry eyes and blurred vision. If you wear contact lenses, you may feel some discomfort. Lubricating drops may help. See your eyecare professional if the problem does not go away or is severe. Avoid extreme heat. This medicine can cause you to sweat less than normal. Your body temperature could increase to dangerous levels,  which may lead to heat stroke. What side effects may I notice from receiving this medicine? Side effects that you should report to your doctor or health care professional as soon as possible: -allergic reactions like skin rash, itching or hives, swelling of the face, lips, or tongue -agitation -breathing problems -confusion -fever -flushing (reddening of the skin) -hallucinations -memory loss -pain or difficulty passing urine -palpitations -unusually weak or tired Side effects that usually do not require medical attention (report to your doctor or health care professional if they continue or are bothersome): -constipation -headache -sexual difficulties (impotence) This list may not describe all possible side effects. Call your doctor for medical advice about side effects. You may report side effects to FDA at 1-800-FDA-1088. Where should I keep my medicine? Keep out of the reach of children. Store at room  temperature between 15 and 30 degrees C (59 and 86 degrees F). Protect from moisture and humidity. Throw away any unused medicine after the expiration date. NOTE: This sheet is a summary. It may not cover all possible information. If you have questions about this medicine, talk to your doctor, pharmacist, or health care provider.  2014, Elsevier/Gold Standard. (2007-12-31 16:50:01)

## 2013-10-06 NOTE — Assessment & Plan Note (Signed)
Will start ditropan 5 mg tid.  Pt also counseled and provided info on lifestyle modification and Kegel exercises. If no improvement with lifestyle, exercises and meds consider referral to Urology.

## 2013-10-06 NOTE — Progress Notes (Signed)
Case discussed with Dr. Michail Sermon soon after the resident saw the patient.  We reviewed the resident's history and exam and pertinent patient test results.  I agree with the assessment, diagnosis and plan of care documented in the resident's note.

## 2013-10-08 ENCOUNTER — Other Ambulatory Visit: Payer: Self-pay | Admitting: Internal Medicine

## 2013-10-08 ENCOUNTER — Telehealth: Payer: Self-pay | Admitting: *Deleted

## 2013-10-08 DIAGNOSIS — J309 Allergic rhinitis, unspecified: Secondary | ICD-10-CM

## 2013-10-08 MED ORDER — SALINE SPRAY 0.65 % NA SOLN: 1.0000 | NASAL | Status: DC | PRN

## 2013-10-08 MED ORDER — TRIAMCINOLONE ACETONIDE 55 MCG/ACT NA AERO: 2.0000 | INHALATION_SPRAY | Freq: Every day | NASAL | Status: DC

## 2013-10-08 MED ORDER — LORATADINE 10 MG PO TABS: 10.0000 mg | ORAL_TABLET | Freq: Every day | ORAL | Status: DC

## 2013-10-08 NOTE — Telephone Encounter (Signed)
Pt called in and wants something for allergies called to pharmacy.  You talked about it at last OV.

## 2013-10-08 NOTE — Telephone Encounter (Signed)
Sent in RX today for claritin, saline nasal spray, and nasacort.

## 2013-10-08 NOTE — Telephone Encounter (Signed)
Pt informed

## 2013-10-20 ENCOUNTER — Ambulatory Visit: Payer: PRIVATE HEALTH INSURANCE | Admitting: Internal Medicine

## 2013-10-28 ENCOUNTER — Ambulatory Visit: Payer: PRIVATE HEALTH INSURANCE

## 2013-11-03 ENCOUNTER — Encounter: Payer: Self-pay | Admitting: Internal Medicine

## 2013-11-03 ENCOUNTER — Ambulatory Visit (INDEPENDENT_AMBULATORY_CARE_PROVIDER_SITE_OTHER): Payer: PRIVATE HEALTH INSURANCE | Admitting: Internal Medicine

## 2013-11-03 VITALS — BP 147/98 | HR 96 | Temp 97.4°F | Ht 70.0 in | Wt 204.7 lb

## 2013-11-03 DIAGNOSIS — J029 Acute pharyngitis, unspecified: Secondary | ICD-10-CM | POA: Insufficient documentation

## 2013-11-03 DIAGNOSIS — F17200 Nicotine dependence, unspecified, uncomplicated: Secondary | ICD-10-CM

## 2013-11-03 DIAGNOSIS — I10 Essential (primary) hypertension: Secondary | ICD-10-CM

## 2013-11-03 MED ORDER — AMLODIPINE BESYLATE 5 MG PO TABS: 5.0000 mg | ORAL_TABLET | Freq: Every day | ORAL | Status: DC

## 2013-11-03 MED ORDER — AZITHROMYCIN 250 MG PO TABS: ORAL_TABLET | ORAL | Status: DC

## 2013-11-03 MED ORDER — GUAIFENESIN-DM 100-10 MG/5ML PO SYRP: 5.0000 mL | ORAL_SOLUTION | ORAL | Status: DC | PRN

## 2013-11-03 NOTE — Assessment & Plan Note (Signed)
Patient states that she stopped smoking but then again she said her last cigarette was about a week ago. I do not think she stopped smoking. Counseled her about smoking cessation. Patient does not seem motivated to stop at this time. Will need to be addressed during routine office visits. Patient already has signs of COPD/Emphysema changes on the CXR.

## 2013-11-03 NOTE — Assessment & Plan Note (Signed)
Productive cough (greenish colored sputum), sore throat, fever, hoarseness of voice in a chronic smoker with CXR findings suggestive of COPD/Emphysema. Symptoms are suggestive of Acute pharyngitis plus or minus acute bronchitis. Discussed with the attending regarding further management.  Plans: Given the severity of symptoms, would empirically treat with Azithromycin x 5 days.  Recommended to continue conservative management with throat lozenges, cough syrup, tylenol. Follow up as needed or if symptoms persist or worsen. Counseled patient on tobacco cessation.

## 2013-11-03 NOTE — Progress Notes (Signed)
Subjective:   Patient ID: Laura [name] female   DOB: [birth year] 68 y.o.   MRN: [id number]  HPI: [name] is a 68 y.o. woman with PMH significant for HTN comes to the office with CC of productive cough, fever, sore throat, hoarseness of voice x 4-5 days.  Patient reports that her symptoms started last Thursday (10/30/13) when she went to attend a birthday party. When she returned from the birthday party she felt very "sick" along with body pains and went to bed early. She woke up the next day with sore throat, productive cough with greenish colored sputum, fever of 103 F (on Saturday), hoarseness of voice. She reports body pains and frontal headaches but denies any SOB, chest pain, nausea, vomiting, abdominal pain, dysuria, urinary frequency. She complains of fatigue.  She denies any other complaints.   Past Medical History  Diagnosis Date  . Lymphadenopathy     documented on chest CT scan 12/15/2005  . Substance abuse     tobacco  . Chronic kidney disease     ?renal cyst on MRI    Current Outpatient Prescriptions  Medication Sig Dispense Refill  . gabapentin (NEURONTIN) 600 MG tablet Take 600 mg by mouth 3 (three) times daily.      Marland Kitchen HYDROcodone-acetaminophen (NORCO) 10-325 MG per tablet Take 1 tablet by mouth every 6 (six) hours as needed for moderate pain.      Marland Kitchen loratadine (CLARITIN) 10 MG tablet Take 1 tablet (10 mg total) by mouth daily.  30 tablet  3  . pantoprazole (PROTONIX) 40 MG tablet Take 1 tablet (40 mg total) by mouth daily.  30 tablet  3  . triamcinolone (NASACORT) 55 MCG/ACT AERO nasal inhaler Place 2 sprays into the nose daily.  1 Inhaler  12   No current facility-administered medications for this visit.   Family History  Problem Relation Age of Onset  . Diabetes Father   . Hypertension Father    History   Social History  . Marital Status: Single    Spouse Name: N/A    Number of Children: 0  . Years of Education: N/A   Occupational History    .     Social History Main Topics  . Smoking status: Current Every Day Smoker -- 0.10 packs/day    Types: Cigarettes    Last Attempt to Quit: 10/29/2010  . Smokeless tobacco: Not on file     Comment: down to 1-2 per day  . Alcohol Use: Yes     Comment: social during holidays  . Drug Use: No  . Sexual Activity: Not on file   Other Topics Concern  . Not on file   Social History Narrative   Patient's new Phone # [phone] and [phone].   Review of Systems: Pertinent items are noted in HPI. Objective:  Physical Exam: There were no vitals filed for this visit.  BP 147/98 Temp: 97.4 Pulse 96 SpO2: 97  Constitutional: Vital signs reviewed.  Patient is a well-developed and well-nourished and is in no acute distress and cooperative with exam. Alert and oriented x3.  Head: Normocephalic and atraumatic Ear: TM normal bilaterally Nose: No erythema or drainage noted.  Moderately enlarged inferior turbinates on both sides. Sinuses: No tenderness to palpation over the sinuses. Mouth:Posterior pharyngeal erythema noted without any exudates. Bilateral tonsillar enlargement without any exudates or uvula deviation. Eyes: PERRL, EOMI, conjunctivae normal, No scleral icterus.  Neck: No palpable cervical lymphadenopathy noted. Cardiovascular: RRR, S1 normal, S2 normal,  no MRG. Pulmonary/Chest: normal respiratory effort, CTAB, no wheezes, rales, or rhonchi Neurological: A&O x3 Skin: Warm, dry and intact.  Psychiatric: Normal mood and affect.    Assessment & Plan:

## 2013-11-03 NOTE — Assessment & Plan Note (Signed)
BP elevated and currently not taking any medicines. Patient reports that she felt "dizzy" after she took Norvasc but is willing to reconsider it.  Plans: Restart Norvasc at 5 mg .

## 2013-11-03 NOTE — Patient Instructions (Signed)
Take the antibiotic, cough syrup as instructed. Start using Norvasc 5 mg one tablet once daily. If your symptoms do not improve or get worse, please call the clinic.

## 2013-11-04 ENCOUNTER — Other Ambulatory Visit: Payer: Self-pay | Admitting: *Deleted

## 2013-11-04 DIAGNOSIS — N393 Stress incontinence (female) (male): Secondary | ICD-10-CM

## 2013-11-05 MED ORDER — OXYBUTYNIN CHLORIDE 5 MG PO TABS: 5.0000 mg | ORAL_TABLET | Freq: Three times a day (TID) | ORAL | Status: DC

## 2013-11-05 NOTE — Progress Notes (Signed)
Case discussed with Dr. Eyvonne Mechanic at the time of the visit.  We reviewed the resident's history and exam and pertinent patient test results.  I agree with the plan of care documented in the resident's note.

## 2013-11-12 ENCOUNTER — Ambulatory Visit
Admission: RE | Admit: 2013-11-12 | Discharge: 2013-11-12 | Disposition: A | Discharge status: Home / Self Care | Payer: PRIVATE HEALTH INSURANCE | Source: Ambulatory Visit | Attending: Internal Medicine | Admitting: Internal Medicine

## 2013-11-12 ENCOUNTER — Encounter (INDEPENDENT_AMBULATORY_CARE_PROVIDER_SITE_OTHER): Payer: Self-pay

## 2013-11-12 DIAGNOSIS — Z1239 Encounter for other screening for malignant neoplasm of breast: Secondary | ICD-10-CM

## 2013-11-12 DIAGNOSIS — Z0000 Encounter for general adult medical examination without abnormal findings: Secondary | ICD-10-CM

## 2013-11-18 ENCOUNTER — Other Ambulatory Visit: Payer: Self-pay | Admitting: Internal Medicine

## 2014-03-16 ENCOUNTER — Encounter: Payer: Self-pay | Admitting: Internal Medicine

## 2014-03-16 ENCOUNTER — Ambulatory Visit (INDEPENDENT_AMBULATORY_CARE_PROVIDER_SITE_OTHER): Payer: PRIVATE HEALTH INSURANCE | Admitting: Internal Medicine

## 2014-03-16 ENCOUNTER — Ambulatory Visit (HOSPITAL_COMMUNITY)
Admission: RE | Admit: 2014-03-16 | Discharge: 2014-03-16 | Disposition: A | Discharge status: Home / Self Care | Payer: PRIVATE HEALTH INSURANCE | Source: Ambulatory Visit | Attending: Internal Medicine | Admitting: Internal Medicine

## 2014-03-16 VITALS — BP 160/86 | HR 80 | Temp 98.4°F | Ht 70.0 in | Wt 196.6 lb

## 2014-03-16 DIAGNOSIS — E785 Hyperlipidemia, unspecified: Secondary | ICD-10-CM

## 2014-03-16 DIAGNOSIS — R079 Chest pain, unspecified: Secondary | ICD-10-CM | POA: Insufficient documentation

## 2014-03-16 DIAGNOSIS — IMO0002 Reserved for concepts with insufficient information to code with codable children: Secondary | ICD-10-CM

## 2014-03-16 DIAGNOSIS — R0789 Other chest pain: Secondary | ICD-10-CM | POA: Diagnosis not present

## 2014-03-16 DIAGNOSIS — Z23 Encounter for immunization: Secondary | ICD-10-CM

## 2014-03-16 DIAGNOSIS — F17200 Nicotine dependence, unspecified, uncomplicated: Secondary | ICD-10-CM

## 2014-03-16 DIAGNOSIS — Z0000 Encounter for general adult medical examination without abnormal findings: Secondary | ICD-10-CM

## 2014-03-16 DIAGNOSIS — I10 Essential (primary) hypertension: Secondary | ICD-10-CM

## 2014-03-16 LAB — TROPONIN I: Troponin I: <0.3 ng/mL (ref <0.06)

## 2014-03-16 MED ORDER — PANTOPRAZOLE SODIUM 40 MG PO TBEC
40.0000 mg | DELAYED_RELEASE_TABLET | Freq: Every day | ORAL | Status: DC
Start: 2014-03-16 — End: 2015-05-28

## 2014-03-16 NOTE — Assessment & Plan Note (Signed)
BP Readings from Last 3 Encounters:  03/16/14 160/86  11/03/13 147/98  10/06/13 142/80    Lab Results  Component Value Date   NA 140 12/30/2012   K 4.3 12/30/2012   CREATININE 0.97 12/30/2012    Assessment: Blood pressure control: mildly elevated Progress toward BP goal:  unchanged Comments: Repeat blood pressure with systolics ranging from 893T to 150s during orthostatics. Patient also reporting 9/10 chronic back pain. Patient has not been taking her amlodipine as she reports that it makes her dizzy.  Plan: Medications:  Will hold off on starting any new pharmacotherapy at this point. May consider ACE inhibitor or beta blocker monotherapy in the future pending repeat blood pressure measurements and cardiac workup.

## 2014-03-16 NOTE — Progress Notes (Signed)
   Subjective:    Patient ID: Laura [name], female    DOB: December 27, 1945, 68 y.o.   MRN: [id number]  Laura [name] is a 68 year old woman with a history of hypertension, tobacco abuse, hyperlipidemia, depression who presents to clinic for a routine follow up.  Please refer to separate problem-list charting for more details.  Dizziness Associated symptoms include chest pain. Pertinent negatives include no abdominal pain, chills, coughing, fever, nausea, sore throat or vomiting.  Hypertension Associated symptoms include chest pain. Pertinent negatives include no palpitations or shortness of breath.  Back Pain Associated symptoms include chest pain. Pertinent negatives include no abdominal pain, dysuria or fever.    Review of Systems  Constitutional: Negative for fever and chills.  HENT: Negative for rhinorrhea and sore throat.   Eyes: Negative for visual disturbance.  Respiratory: Negative for cough and shortness of breath.   Cardiovascular: Positive for chest pain. Negative for palpitations.  Gastrointestinal: Negative for nausea, vomiting, abdominal pain, diarrhea, constipation and blood in stool.  Genitourinary: Negative for dysuria and hematuria.  Musculoskeletal: Positive for back pain.  Neurological: Positive for dizziness. Negative for syncope.       Objective:   Physical Exam  Constitutional: She is oriented to person, place, and time. She appears well-developed and well-nourished. No distress.  HENT:  Head: Normocephalic and atraumatic.  Eyes: EOM are normal. Pupils are equal, round, and reactive to light. Left eye exhibits no discharge.  Neck: Normal range of motion. Neck supple. No thyromegaly present.  Cardiovascular: Normal rate and regular rhythm.  Exam reveals no gallop and no friction rub.   No murmur heard. Pulmonary/Chest: Effort normal and breath sounds normal. No respiratory distress. She has no wheezes. She has no rales.  Abdominal: Soft. Bowel sounds are  normal. She exhibits no distension. There is no tenderness. There is no rebound.  Musculoskeletal: She exhibits no edema.  Neurological: She is alert and oriented to person, place, and time. No cranial nerve deficit.  Skin: Skin is warm and dry. No rash noted.  Psychiatric: She has a normal mood and affect. Thought content normal.       Assessment & Plan:  Please refer to separate problem-list charting for more details.

## 2014-03-16 NOTE — Assessment & Plan Note (Signed)
Flu shot and Tdap today.

## 2014-03-16 NOTE — Patient Instructions (Addendum)
We have referred you to cardiology for further workup of your chest pain. We will also perform an echocardiogram to further assess the function and structure of your heart. We have also done a couple blood tests, including cholesterol and cardiac enzyme tests. We will call you with any abnormal results that would require any change in your medications. Please go to the emergency department if you start having any severe chest pain or dizziness.   General Instructions:   Please bring your medicines with you each time you come to clinic.  Medicines may include prescription medications, over-the-counter medications, herbal remedies, eye drops, vitamins, or other pills.   Progress Toward Treatment Goals:  Treatment Goal 10/06/2013  Blood pressure at goal  Stop smoking smoking the same amount    Self Care Goals & Plans:  Self Care Goal 03/16/2014  Manage my medications take my medicines as prescribed; bring my medications to every visit; refill my medications on time  Monitor my health -  Eat healthy foods drink diet soda or water instead of juice or soda; eat more vegetables; eat foods that are low in salt; eat baked foods instead of fried foods; eat fruit for snacks and desserts  Be physically active -  Stop smoking -    No flowsheet data found.   Care Management & Community Referrals:  No flowsheet data found.

## 2014-03-16 NOTE — Assessment & Plan Note (Signed)
Patient with a history of tobacco abuse reporting several months of exertional chest pain that is sharp in nature, parasternal, nonradiating, 5/10 in severity, and is relieved with laying down and rest. Exam unremarkable for any murmurs. Patient does endorse dizziness as well, but cannot remember whether it is associated with her episodes of chest pain. Her father has a history of congestive heart failure.  - EKG, echocardiogram, lipid panel, cardiac enzymes, cardiology consult pending issues with insurance

## 2014-03-16 NOTE — Progress Notes (Signed)
INTERNAL MEDICINE TEACHING ATTENDING ADDENDUM - Aldine Contes, MD: I personally saw and evaluated Ms. Platte in this clinic visit in conjunction with the resident, Dr. Raelene Bott. I have discussed patient's plan of care with medical resident during this visit. I have confirmed the physical exam findings and have read and agree with the clinic note including the plan with the following addition: -Pt with CP relieved with rest and intermittent lightheadedness - EKG with no acute ST/T wave changes - Pt with possible angina- Will refer to cardio for further w/u - Will check 2 D ECHO- r/o valvular abnormalities but no murmur heard on ECHO - f/u cardiac enzymes and refer to cardio. Will likely need stress test

## 2014-03-16 NOTE — Assessment & Plan Note (Addendum)
Patient with last lipid panel from over a year ago with total cholesterol within normal limits but elevated LDL.  Patient currently not on statin therapy.  - Will repeat lipid panel today.  Addendum. Patient with 10 year ASCVD risk of 27%, warranting moderate to high intensity statin.  - Pravastatin 80 mg daily given insurance.

## 2014-03-16 NOTE — Addendum Note (Signed)
Addended by: Sander Nephew F on: 03/16/2014 05:10 PM   Modules accepted: Orders

## 2014-03-16 NOTE — Assessment & Plan Note (Signed)
Patient reports chronic pain in her back and legs, but reports satisfaction with management by orthopedics.  - Continue to defer to orthopedics for management of chronic back pain.

## 2014-03-16 NOTE — Assessment & Plan Note (Signed)
  Assessment: Progress toward smoking cessation:  smoking the same amount Comments: Patient smoking about one to 2 cigarettes a day.  Plan: Instruction/counseling given:  I counseled patient on the dangers of tobacco use, advised patient to stop smoking, and reviewed strategies to maximize success. Educational resources provided:  [employer] account manager) brochure

## 2014-03-17 ENCOUNTER — Telehealth: Payer: Self-pay | Admitting: Internal Medicine

## 2014-03-17 LAB — LIPID PANEL
CHOL/HDL RATIO: 5.5 ratio
CHOLESTEROL: 164 mg/dL (ref 0–200)
HDL: 30 mg/dL — ABNORMAL LOW (ref >39)
LDL Cholesterol: 118 mg/dL — ABNORMAL HIGH (ref 0–99)
TRIGLYCERIDES: 82 mg/dL (ref <150)
VLDL: 16 mg/dL (ref 0–40)

## 2014-03-17 MED ORDER — PRAVASTATIN SODIUM 80 MG PO TABS: 80.0000 mg | ORAL_TABLET | Freq: Every evening | ORAL | Status: DC

## 2014-03-17 NOTE — Telephone Encounter (Signed)
Patient was notified about the results of her lipid panel. Her ASCVD risk for 10 years is 27%. Given this, I have started her on moderate intensity statin, pravastatin 80 mg. Patient understands this change in management, and is amenable to it.

## 2014-03-17 NOTE — Addendum Note (Signed)
Addended by: Seward Meth on: 03/17/2014 04:45 PM   Modules accepted: Orders

## 2014-03-25 ENCOUNTER — Telehealth: Payer: Self-pay | Admitting: *Deleted

## 2014-03-25 NOTE — Telephone Encounter (Signed)
Pt called and is concerned about the new medication she was given.  She has received feedback from family members and wants to talk about starting Pravastatin. Pt # [id number]

## 2014-03-27 NOTE — Telephone Encounter (Signed)
Not able to reach patient on the phone. Will try back again at a later point.

## 2014-03-28 NOTE — Telephone Encounter (Signed)
Patient again not able to be reached. Will try again at a later point.

## 2014-04-03 ENCOUNTER — Other Ambulatory Visit: Payer: Self-pay | Admitting: *Deleted

## 2014-04-03 NOTE — Telephone Encounter (Signed)
Pt calls and states since she is taking cholesterol medicine does she need to restart her blood pressure medicine since she is taking the cholesterol med and it will lower her blood pressure, explained that the pravastatin is for her cholesterol level and not for blood pressure, she states she has not been taking her amlodipine and doesn't really want to but her family tells her she should, she wants to know if dr ngo knows that she is not taking the BP med, in reviewing his visit note she is told yes, he is aware and she needs to keep her appt for re evaluation of bp and the need for bp med, she is agreeable

## 2014-04-04 MED ORDER — GABAPENTIN 600 MG PO TABS: 600.0000 mg | ORAL_TABLET | Freq: Three times a day (TID) | ORAL | Status: DC

## 2014-04-13 ENCOUNTER — Encounter: Payer: Self-pay | Admitting: Internal Medicine

## 2014-04-16 ENCOUNTER — Ambulatory Visit (INDEPENDENT_AMBULATORY_CARE_PROVIDER_SITE_OTHER): Payer: PRIVATE HEALTH INSURANCE | Admitting: Internal Medicine

## 2014-04-16 ENCOUNTER — Ambulatory Visit (HOSPITAL_COMMUNITY)
Admission: RE | Admit: 2014-04-16 | Discharge: 2014-04-16 | Disposition: A | Discharge status: Home / Self Care | Payer: PRIVATE HEALTH INSURANCE | Source: Ambulatory Visit | Attending: Internal Medicine | Admitting: Internal Medicine

## 2014-04-16 ENCOUNTER — Encounter: Payer: Self-pay | Admitting: Internal Medicine

## 2014-04-16 VITALS — BP 117/65 | HR 97 | Temp 98.2°F | Ht 70.0 in | Wt 192.7 lb

## 2014-04-16 DIAGNOSIS — W19XXXA Unspecified fall, initial encounter: Secondary | ICD-10-CM | POA: Insufficient documentation

## 2014-04-16 DIAGNOSIS — M19042 Primary osteoarthritis, left hand: Secondary | ICD-10-CM | POA: Diagnosis not present

## 2014-04-16 DIAGNOSIS — M79642 Pain in left hand: Secondary | ICD-10-CM

## 2014-04-16 DIAGNOSIS — R079 Chest pain, unspecified: Secondary | ICD-10-CM

## 2014-04-16 DIAGNOSIS — I10 Essential (primary) hypertension: Secondary | ICD-10-CM

## 2014-04-16 MED ORDER — ASPIRIN EC 81 MG PO TBEC: 81.0000 mg | DELAYED_RELEASE_TABLET | Freq: Every day | ORAL | Status: AC

## 2014-04-16 NOTE — Assessment & Plan Note (Addendum)
After a fall which happened 4 days ago. It sounds like a mechanical fall. I am concerned about a possibility of a fracture of the fifth metacarpal.  Plan  - Xray of left hand >> Osteopenia. No acute fracture. Will improve. No further intervention needed. - pain management with over-the-counter Tylenol -She might benefit from a DEXA scan given her reported osteopenia on x-ray.

## 2014-04-16 NOTE — Assessment & Plan Note (Addendum)
No chest pain currently. 2D Echo has not been done. Patient takes Pravastatin which I believe has led to reports of muscle cramps. I reassured her that the cramps will most likely use if she continues taking pravastatin. Plan -2-D echocardiogram has been scheduled for Monday before her cardiology visit - Continue with pravastatin 80 mg daily. -Start aspirin 81 mg daily. -Blood pressure is better. -Follow-up if she has chest pain

## 2014-04-16 NOTE — Patient Instructions (Signed)
General Instructions: Please continue with your medications as before  Please be sure to follow up with your cardiologist visit Please start taking Aspirin 81 mg daily  We will do an x ray of your hand. I will call you with results    Thank you for bringing your medicines today. This helps [place] keep you safe from mistakes.   Progress Toward Treatment Goals:  Treatment Goal 04/16/2014  Blood pressure at goal  Stop smoking smoking the same amount    Self Care Goals & Plans:  Self Care Goal 04/16/2014  Manage my medications take my medicines as prescribed; bring my medications to every visit; refill my medications on time  Monitor my health -  Eat healthy foods -  Be physically active -  Stop smoking -    No flowsheet data found.   Care Management & Community Referrals:  Referral 03/16/2014  Referrals made to community resources smoking cessation

## 2014-04-16 NOTE — Progress Notes (Signed)
Patient ID: Laura [name], female   DOB: Dec 16, 1945, 68 y.o.   MRN: [id number]   Subjective:   HPI: Laura [name] is a 68 y.o. woman with a history of hypertension, tobacco abuse, hyperlipidemia, depression who presents to clinic for a follow up visit.   Reason(s) for this visit: 1. HTN: During her last office visit on 03/16/2014, BP was elevated and she was started on Amlodipine 5 mg daily. She's been compliant. Blood pressure is well controlled today. 2. Left Hand Pain: Patient reports that she fell on Sunday, 04/13/2014. She was trying to climb the stairs in her house. She states that rail on the stairs was loose and she stumbled and fell on her left arm after it broke. She did not hit her head. The pain and swelling are improving. She has not tried any treatments. 3. Chest pain: During her last visit on 03/16/2014, patient indicated chest pain and increasing dyspnea on exertion. Echocardiogram was ordered, but this has not been performed due to preauthorization issues with her insurance. An EKG, which was performed in the office was normal. She has an appointment with Dr. Tonia Brooms of cardiology on Monday 04/20/2014. She is aware of this appointment. She otherwise denies recurrence of chest pain. However, she indicates that she is still short of breath on exertion. She has no swelling in her lower extremities. Patient is a current everyday smoker and is interested in quitting smoking.  She was also initiated on pravastatin 80 mg daily, which has been taking "most of the days". I encouraged compliancy with this medication to reduce her risk for coronary artery disease. I also recommended that she starts taking a daily aspirin 81 mg. Her father had heart failure but no other family members with heart problems. She has no prior past medical hx of CAD.   ROS: Constitutional: Denies fever, chills, diaphoresis, appetite change and fatigue.  Respiratory: Denies SOB, cough, chest tightness, and  wheezing. Denies chest pain. CVS: No palpitations and leg swelling. No PND or orthopnea GI: No abdominal pain, nausea, vomiting, bloody stools GU: No dysuria, frequency, hematuria, or flank pain.  MSK: No myalgias, back pain, joint swelling, arthralgias  Psych: No depression symptoms. No SI or SA.    Objective:  Physical Exam: Filed Vitals:   04/16/14 1336  BP: 117/65  Pulse: 97  Temp: 98.2 F (36.8 C)  TempSrc: Oral  Height: 5\' 10"  (1.778 m)  Weight: 192 lb 11.2 oz (87.408 kg)  SpO2: 98%   General: Well nourished. No acute distress.  HEENT: Normal oral mucosa. MMM.  Lungs: CTA bilaterally. JVD not appreciated.  Heart: RRR; no extra sounds or murmurs  Abdomen: Non-distended, normal bowel sounds, soft, nontender; no hepatosplenomegaly  Extremities: No pedal edema. No joint swelling or tenderness. She has tenderness in the region of the left 4th and fifth metacarpal. No swelling, or bruises notable. She is able to have full function of her hand. She has reduced range of motion of interphalangeal joints of the fifth finger due to pain.   Neurologic: Normal EOM,  Alert and oriented x3. No obvious neurologic/cranial nerve deficits.  Assessment & Plan:  Discussed case with my attending in the clinic, Dr. Eppie Gibson. See problem based charting.

## 2014-04-17 ENCOUNTER — Other Ambulatory Visit (HOSPITAL_COMMUNITY): Payer: PRIVATE HEALTH INSURANCE

## 2014-04-17 NOTE — Assessment & Plan Note (Signed)
BP Readings from Last 3 Encounters:  04/16/14 117/65  03/16/14 160/86  11/03/13 147/98    Lab Results  Component Value Date   NA 140 12/30/2012   K 4.3 12/30/2012   CREATININE 0.97 12/30/2012    Assessment: Blood pressure control: controlled Progress toward BP goal:  at goal Comments: On amlodipine 5 mg daily  Plan: Medications:  continue current medications Educational resources provided:   Self management tools provided: home blood pressure logbook Other plans: Follow-up routinely

## 2014-04-20 ENCOUNTER — Ambulatory Visit (HOSPITAL_COMMUNITY)
Admission: RE | Admit: 2014-04-20 | Discharge: 2014-04-20 | Disposition: A | Discharge status: Home / Self Care | Payer: PRIVATE HEALTH INSURANCE | Source: Ambulatory Visit | Attending: Internal Medicine | Admitting: Internal Medicine

## 2014-04-20 ENCOUNTER — Encounter: Payer: Self-pay | Admitting: Cardiology

## 2014-04-20 ENCOUNTER — Ambulatory Visit (INDEPENDENT_AMBULATORY_CARE_PROVIDER_SITE_OTHER): Payer: PRIVATE HEALTH INSURANCE | Admitting: Cardiology

## 2014-04-20 VITALS — BP 130/90 | HR 80 | Ht 70.0 in | Wt 190.0 lb

## 2014-04-20 DIAGNOSIS — Z720 Tobacco use: Secondary | ICD-10-CM | POA: Diagnosis not present

## 2014-04-20 DIAGNOSIS — R079 Chest pain, unspecified: Secondary | ICD-10-CM | POA: Insufficient documentation

## 2014-04-20 DIAGNOSIS — I10 Essential (primary) hypertension: Secondary | ICD-10-CM

## 2014-04-20 DIAGNOSIS — R0789 Other chest pain: Secondary | ICD-10-CM

## 2014-04-20 DIAGNOSIS — E785 Hyperlipidemia, unspecified: Secondary | ICD-10-CM | POA: Diagnosis not present

## 2014-04-20 DIAGNOSIS — I517 Cardiomegaly: Secondary | ICD-10-CM

## 2014-04-20 DIAGNOSIS — R0609 Other forms of dyspnea: Secondary | ICD-10-CM

## 2014-04-20 NOTE — Patient Instructions (Signed)
Your physician recommends that you continue on your current medications as directed. Please refer to the Current Medication list given to you today.  Your physician has requested that you have a lexiscan myoview. For further information please visit [email or web address]. Please follow instruction sheet, as given.  Follow up as needed  

## 2014-04-20 NOTE — Progress Notes (Signed)
*  PRELIMINARY RESULTS* Echocardiogram 2D Echocardiogram has been performed.  Leavy Cella 04/20/2014, 10:14 AM

## 2014-04-20 NOTE — Progress Notes (Signed)
Laura [name] Date of Birth:  [birth year] [facility] [address] [place], [place]  [address] [phone]        Fax   [phone]   History of Present Illness: This pleasant 68 year old African-American woman is seen by me for the first time today.  He is seen at the request of Dr.Kazibwe of the [facility] internal [place]. The patient is being seen because of a history of intermittent chest heaviness and pressure.  This has been going on for approximately 2 months.  She also states that she has had a history of palpitations intermittently dating back several years.  She has been experiencing exertional dyspnea.  She has had some high blood pressure readings and she has experienced some tingling of her left forearm.  Only recently was she started on treatment for hypertension.  She does not low how long she has had hypertension.the patient is a cigarette smoker.  She is trying to quit.  She is not diabetic.  She does have a history of hyperlipidemia and depression. She has a past history of multiple back operations and is on disability because of her back condition.  She has had nerve damage from her back condition and has chronic numbness of the left leg.  She walks with a cane or a walker. Her family history reveals that her mother died of COPD and smoking at age 62.  Her father died of heart failure at age 92  Current Outpatient Prescriptions  Medication Sig Dispense Refill  . amLODipine (NORVASC) 5 MG tablet Take 1 tablet (5 mg total) by mouth daily. 30 tablet 4  . aspirin EC 81 MG tablet Take 1 tablet (81 mg total) by mouth daily. 150 tablet 2  . gabapentin (NEURONTIN) 600 MG tablet Take 1 tablet (600 mg total) by mouth 3 (three) times daily. 270 tablet 1  . HYDROcodone-acetaminophen (NORCO) 10-325 MG per tablet Take 1 tablet by mouth every 6 (six) hours as needed for moderate pain.    Marland Kitchen loratadine (CLARITIN) 10 MG tablet Take 1 tablet (10 mg total)  by mouth daily. 30 tablet 3  . oxybutynin (DITROPAN) 5 MG tablet Take 1 tablet (5 mg total) by mouth 3 (three) times daily. 270 tablet 4  . pravastatin (PRAVACHOL) 80 MG tablet Take 1 tablet (80 mg total) by mouth every evening. 30 tablet 11  . triamcinolone (NASACORT) 55 MCG/ACT AERO nasal inhaler Place 2 sprays into the nose daily. 1 Inhaler 12  . pantoprazole (PROTONIX) 40 MG tablet Take 1 tablet (40 mg total) by mouth daily. 90 tablet 2   No current facility-administered medications for this visit.    No Known Allergies  Patient Active Problem List   Diagnosis Date Noted  . Left hand pain 04/16/2014  . Chest pain 03/16/2014  . Acute pharyngitis 11/03/2013  . Stress incontinence in female 10/06/2013  . Breast cancer screening 10/06/2013  . Cough 08/04/2013  . HTN (hypertension) 08/04/2013  . Health care maintenance 12/31/2012  . Post laminectomy syndrome 01/30/2012  . Lumbago 01/30/2012  . Back muscle spasm 05/31/2011  . GERD (gastroesophageal reflux disease) 01/05/2011  . HYPERLIPIDEMIA 05/18/2010  . DEPRESSION 11/25/2009  . Low back pain with radiation 04/16/2009  . TOBACCO ABUSE 03/12/2009  . PERIPHERAL NEUROPATHY, LOWER EXTREMITY, LEFT 03/12/2009  . ALLERGIC RHINITIS 10/09/2007  . CARPAL TUNNEL SYNDROME, LEFT 09/12/2007    History  Smoking status  . Current Every Day Smoker -- 0.10 packs/day  .  Types: Cigarettes  . Last Attempt to Quit: 10/29/2010  Smokeless tobacco  . Not on file    Comment: down to 1-2 per day    History  Alcohol Use  . Yes    Comment: social during holidays    Family History  Problem Relation Age of Onset  . Diabetes Father   . Hypertension Father     Review of Systems: Constitutional: no fever chills diaphoresis or fatigue or change in weight.  Head and neck: no hearing loss, no epistaxis, no photophobia or visual disturbance. Respiratory: No cough, shortness of breath or wheezing. Cardiovascular: positive for chest pain and  intermittent palpitations Gastrointestinal: No abdominal distention, no abdominal pain, no change in bowel habits hematochezia or melena. Genitourinary: No dysuria, no frequency, no urgency, no nocturia. Musculoskeletal:No arthralgias, no back pain, no gait disturbance or myalgias. Neurological: No dizziness, no headaches, no numbness, no seizures, no syncope, no weakness, no tremors. Hematologic: No lymphadenopathy, no easy bruising. Psychiatric: No confusion, no hallucinations, no sleep disturbance.    Physical Exam: Filed Vitals:   04/20/14 1036  BP: 130/90  Pulse: 80  The patient appears to be in no distress.  Head and neck exam reveals that the pupils are equal and reactive.  The extraocular movements are full.  There is no scleral icterus.  Mouth and pharynx are benign.  No lymphadenopathy.  No carotid bruits.  The jugular venous pressure is normal.  Thyroid is not enlarged or tender.  Chest is clear to percussion and auscultation.  No rales or rhonchi.  Expansion of the chest is symmetrical.  Heart reveals no abnormal lift or heave.  First and second heart sounds are normal.  There is no murmur gallop rub or click.  The abdomen is soft and nontender.  Bowel sounds are normoactive.  There is no hepatosplenomegaly or mass.  There are no abdominal bruits.  Extremities reveal no phlebitis or edema.  Pedal pulses are good.  There is no cyanosis or clubbing.  Neurologic exam is normal strength and no lateralizing weakness.  No sensory deficits.  Integument reveals no rash  EKG shows normal sinus rhythm and no ischemic changes at rest  Assessment / Plan: 1. Intermittent chest pressure uncertain etiology. 2.  Dyspnea on exertion 3.  Ongoing cigarette abuse 4.  Essential hypertension 5.  Hyperlipidemia 6.  On disability because of chronic back condition with left leg numbness.  Walks with a cane or with a walker  Disposition: The patient will return for a Lexiscan Myoview  stress test. I have urged her to stop smoking completely Many thanks for the opportunity to see this pleasant woman with you and we will be in touch regarding the results of her stress test. Recheck here when necessary

## 2014-04-22 NOTE — Progress Notes (Signed)
Case discussed with Dr. Kazibwe soon after the resident saw the patient.  We reviewed the resident's history and exam and pertinent patient test results.  I agree with the assessment, diagnosis and plan of care documented in the resident's note. 

## 2014-04-27 ENCOUNTER — Telehealth: Payer: Self-pay | Admitting: *Deleted

## 2014-04-27 ENCOUNTER — Ambulatory Visit (HOSPITAL_COMMUNITY): Payer: PRIVATE HEALTH INSURANCE | Attending: Cardiology | Admitting: Radiology

## 2014-04-27 DIAGNOSIS — R079 Chest pain, unspecified: Secondary | ICD-10-CM

## 2014-04-27 DIAGNOSIS — R0609 Other forms of dyspnea: Secondary | ICD-10-CM | POA: Diagnosis not present

## 2014-04-27 DIAGNOSIS — R0789 Other chest pain: Secondary | ICD-10-CM | POA: Diagnosis not present

## 2014-04-27 DIAGNOSIS — R002 Palpitations: Secondary | ICD-10-CM | POA: Insufficient documentation

## 2014-04-27 DIAGNOSIS — I10 Essential (primary) hypertension: Secondary | ICD-10-CM | POA: Insufficient documentation

## 2014-04-27 MED ORDER — REGADENOSON 0.4 MG/5ML IV SOLN
0.4000 mg | Freq: Once | INTRAVENOUS | Status: AC
  Administered 2014-04-27: 0.4 mg via INTRAVENOUS

## 2014-04-27 MED ORDER — TECHNETIUM TC 99M SESTAMIBI GENERIC - CARDIOLITE
10.0000 | Freq: Once | INTRAVENOUS | Status: AC | PRN
  Administered 2014-04-27: 10 via INTRAVENOUS

## 2014-04-27 MED ORDER — TECHNETIUM TC 99M SESTAMIBI GENERIC - CARDIOLITE
30.0000 | Freq: Once | INTRAVENOUS | Status: AC | PRN
  Administered 2014-04-27: 30 via INTRAVENOUS

## 2014-04-27 NOTE — Progress Notes (Signed)
[facility] [place], [place] [address] [phone]    Cardiology Nuclear Med Study  Laura [name] is a 68 y.o. female     MRN : [id number]     DOB: [birth year]  Procedure Date: 04/27/2014  Nuclear Med Background Indication for Stress Test:  Evaluation for Ischemia History:  n/a Cardiac Risk Factors: Hypertension  Symptoms:  Chest Pressure, DOE and Palpitations   Nuclear Pre-Procedure Caffeine/Decaff Intake:  None>12 hrs NPO After: 6:30pm   Lungs:  clear O2 Sat: 96% on room air. IV 0.9% NS with Angio Cath:  20g  IV Site: R Antecubital x 1, tolerated well IV Started by:  Irven Baltimore, RN  Chest Size (in):  36 Cup Size: C  Height: 5\' 10"  (1.778 m)  Weight:  191 lb (86.637 kg)  BMI:  Body mass index is 27.41 kg/(m^2). Tech Comments: Patient took Norvasc this am. Irven Baltimore, RN.     Nuclear Med Study 1 or 2 day study: 1 day  Stress Test Type:  Carlton Adam  Reading MD: N/A  Order Authorizing Provider:  Darlin Coco, MD  Resting Radionuclide: Technetium 92m Sestamibi  Resting Radionuclide Dose: 11.0 mCi   Stress Radionuclide:  Technetium 42m Sestamibi  Stress Radionuclide Dose: 33.0 mCi           Stress Protocol Rest HR: 72 Stress HR: 112  Rest BP: 140/78 Stress BP: 154/97  Exercise Time (min): n/a METS: n/a   Predicted Max HR: 154 bpm % Max HR: 72.73 bpm Rate Pressure Product: 17248   Dose of Adenosine (mg):  n/a Dose of Lexiscan: 0.4 mg  Dose of Atropine (mg): n/a Dose of Dobutamine: n/a mcg/kg/min (at max HR)  Stress Test Technologist: Perrin Maltese, EMT-P  Nuclear Technologist:  Earl Many, CNMT     Rest Procedure:  Myocardial perfusion imaging was performed at rest 45 minutes following the intravenous administration of Technetium 63m Sestamibi. Rest ECG: sinus rhythm, heart rate 66, poor R-wave progression  Stress Procedure:  The patient received IV Lexiscan 0.4 mg over 15-seconds.  Technetium 66m  Sestamibi injected at 30-seconds. This patient felt weird, had nausea, and had a headache with the Lexiscan injection. Quantitative spect images were obtained after a 45 minute delay. Stress ECG: No significant change from baseline ECG  QPS Raw Data Images:  Normal; no motion artifact; normal heart/lung ratio.Bowel loop attenuation artifact seen on rest and stress images. Stress Images:  Homogeneous radiotracer uptake seen at both rest and stress with no areas of significant ischemia identified. There is moderate bowel loop attenuation seen along the inferior/inferolateral wall base to mid. Rest Images:  there is mild decreased uptake along the apical region. Otherwise, homogeneous radiotracer uptake. Subtraction (SDS):  No evidence of ischemia. Transient Ischemic Dilatation (Normal <1.22):  1.16 Lung/Heart Ratio (Normal <0.45):  0.34  Quantitative Gated Spect Images QGS EDV:  93 ml QGS ESV:  46 ml  Impression Exercise Capacity:  Lexiscan with no exercise. BP Response:  Normal blood pressure response. Clinical Symptoms:  nausea, headache ECG Impression:  No significant ST segment change suggestive of ischemia.rare PVC Comparison with Prior Nuclear Study: No images to compare  Overall Impression:  Low risk stress nuclear study with no areas of significant ischemia identified. Bowel loop attenuation artifact decreases sensitivity and specificity of this study..  LV Ejection Fraction: 50%.  LV Wall Motion:  There are no obvious wall motion abnormalities noted.  Candee Furbish, MD

## 2014-04-27 NOTE — Telephone Encounter (Signed)
Pt called - lost med on last visit to clinic and no one called pt. Saw Dr Mare Ferrari recently and states Dr Sherryl Barters office had no info on reason why pt was there. Wants x-ray of hands for Dr Nelva Bush - suggest to call ortho practice to see if Dr Nelva Bush can check on xrays or if not - needs to call xray for copy to carry to Dr Nelva Bush. Hilda Blades Aaima Gaddie RN 04/27/14 4PM

## 2014-05-04 ENCOUNTER — Telehealth: Payer: Self-pay | Admitting: Cardiology

## 2014-05-04 NOTE — Telephone Encounter (Signed)
New Message:  Pt called in stating that she had an Echo done on 11/2 and would like to know if the results were in yet. Please call  Thanks

## 2014-05-04 NOTE — Telephone Encounter (Signed)
Notified of stress test results. 

## 2014-05-13 ENCOUNTER — Ambulatory Visit: Payer: PRIVATE HEALTH INSURANCE | Admitting: Internal Medicine

## 2014-07-14 ENCOUNTER — Encounter: Payer: Self-pay | Admitting: Internal Medicine

## 2014-07-14 ENCOUNTER — Ambulatory Visit (INDEPENDENT_AMBULATORY_CARE_PROVIDER_SITE_OTHER): Payer: Medicare Other | Admitting: Internal Medicine

## 2014-07-14 VITALS — BP 133/90 | HR 93 | Temp 98.0°F | Wt 191.2 lb

## 2014-07-14 DIAGNOSIS — Z720 Tobacco use: Secondary | ICD-10-CM

## 2014-07-14 DIAGNOSIS — F17200 Nicotine dependence, unspecified, uncomplicated: Secondary | ICD-10-CM

## 2014-07-14 DIAGNOSIS — R079 Chest pain, unspecified: Secondary | ICD-10-CM

## 2014-07-14 DIAGNOSIS — I10 Essential (primary) hypertension: Secondary | ICD-10-CM

## 2014-07-14 DIAGNOSIS — J069 Acute upper respiratory infection, unspecified: Secondary | ICD-10-CM

## 2014-07-14 MED ORDER — BENZONATATE 100 MG PO CAPS: 100.0000 mg | ORAL_CAPSULE | Freq: Four times a day (QID) | ORAL | Status: DC | PRN

## 2014-07-14 MED ORDER — NICOTINE 14 MG/24HR TD PT24
14.0000 mg | MEDICATED_PATCH | TRANSDERMAL | Status: DC
Start: 2014-07-14 — End: 2016-11-24

## 2014-07-14 MED ORDER — CHLORPHENIRAMINE MALEATE 4 MG PO TABS: 4.0000 mg | ORAL_TABLET | Freq: Four times a day (QID) | ORAL | Status: DC | PRN

## 2014-07-14 MED ORDER — DM-GUAIFENESIN ER 30-600 MG PO TB12: 1.0000 | ORAL_TABLET | Freq: Two times a day (BID) | ORAL | Status: DC | PRN

## 2014-07-14 NOTE — Progress Notes (Signed)
   Subjective:    Patient ID: Laura [name], female    DOB: 09-01-1945, 69 y.o.   MRN: [id number]  HPI JOLYNNE SPURGIN is a 69 year old woman with history of hypertension, tobacco abuse, hyperlipidemia, and depression presenting with cough.  She reports having a cough for the past 4-5 days.  She has been coughing up white phlegm and it has been keeping her awake at night.  She denies sore throat, but she has facial congestion and rhinorrhea.  She denies fevers, but she has been having some chills and rhinorrhea.  She has some muscle and joint aches, and a headache from sinus pressure.  She says her grandbaby had a cold last week as well.  She has some pain with coughing, which is different than the chest pain she has had in the past.  She followed up with cardiology regarding her sharp chest pain which has been ongoing for the last several months. She was scheduled for Lexus scan Myoview stress test, which showed a low risk study with no areas of significant ischemia identified. She also had an echocardiogram on 04/20/2014 which showed an ejection fraction of 45-50% with diffuse hypokinesis and grade 1 diastolic dysfunction.  She says that she hasn't been taking her amlodipine because she says she doesn't have high blood pressure.  However, she has measured her blood pressure at home and it has been running in the 027-253G systolic.  She is currently smoking 3-4 cigarettes per day, and she is interested in trying a nicotine patch to help her quit.  Review of Systems  Constitutional: Positive for chills, diaphoresis and fatigue. Negative for fever.  HENT: Positive for congestion and rhinorrhea. Negative for sore throat.   Respiratory: Positive for cough. Negative for shortness of breath.   Cardiovascular: Positive for chest pain (When coughing.).  Gastrointestinal: Negative for nausea, vomiting, abdominal pain, diarrhea, constipation and blood in stool.  Genitourinary: Negative for dysuria and  difficulty urinating.  Musculoskeletal: Positive for myalgias and arthralgias. Negative for joint swelling.  Skin: Negative for rash.  Neurological: Negative for dizziness, weakness, light-headedness and numbness.       Objective:   Physical Exam  Constitutional: She is oriented to person, place, and time. She appears well-developed and well-nourished. No distress.  HENT:  Head: Normocephalic and atraumatic.  Mouth/Throat: Oropharyngeal exudate present.  Eyes: Conjunctivae and EOM are normal. Pupils are equal, round, and reactive to light.  Neck: Normal range of motion. Neck supple.  Cardiovascular: Normal rate, regular rhythm and normal heart sounds.   Pulmonary/Chest: Effort normal and breath sounds normal. No respiratory distress. She has no wheezes.  Abdominal: Soft. Bowel sounds are normal. She exhibits no distension. There is no tenderness.  Musculoskeletal: Normal range of motion. She exhibits no edema or tenderness.  Lymphadenopathy:    She has no cervical adenopathy.  Neurological: She is alert and oriented to person, place, and time. No cranial nerve deficit. She exhibits normal muscle tone.  Skin: Skin is warm and dry. No rash noted. No erythema.          Assessment & Plan:  Please see problem-based assessment and plan.

## 2014-07-14 NOTE — Assessment & Plan Note (Signed)
Denies chest pain currently other than musculoskeletal pain with coughing.  Myoview reassuring.  No further follow up with cardiology required. -Reassured patient of Myoview results.

## 2014-07-14 NOTE — Patient Instructions (Signed)
Thank you for coming to clinic today Laura [name].  General instructions: -Your symptoms are most consistent with a common cold. -I sent prescriptions for two cough medicines you can try along with a medication called chlorpheniramine to help with your congestion. -Chlorpheniramine can make you drowsy so I would recommend taking it when you're going to sleep. -I also sent a prescription for a nicotine patch which you can apply every day for 6 weeks to help you quit smoking. At that time, we can prescribe you a less powerful patch for 2 additional weeks. -Please make a follow up appointment to return to clinic in 2 months.  Please bring your medicines with you each time you come.   Medicines may be  Eye drops  Herbal   Vitamins  Pills  Seeing these help [place] take care of you.

## 2014-07-14 NOTE — Assessment & Plan Note (Signed)
BP Readings from Last 3 Encounters:  07/14/14 133/90  04/27/14 140/78  04/20/14 130/90    Lab Results  Component Value Date   NA 140 12/30/2012   K 4.3 12/30/2012   CREATININE 0.97 12/30/2012    Assessment: Blood pressure control: mildly elevated Progress toward BP goal:  deteriorated Comments: Has not been taking blood pressure medications.  Has been running in the 940H systolic at home.  Plan: Medications:  continue current medications: amlodipine 5 mg daily. Other plans: Discussed the importance of keeping up with her blood pressure medications to prevent long term damage to her heart and other organs along with the fact that it is likely her blood pressure will continue to rise with her age.  She expressed understanding and says she will start taking her amlodipine regularly.

## 2014-07-14 NOTE — Assessment & Plan Note (Signed)
  Assessment: Progress toward smoking cessation:  smoking the same amount Comments: Interested in trying Nicoderm patches.  Plan: Instruction/counseling given:  I counseled patient on the dangers of tobacco use, advised patient to stop smoking, and reviewed strategies to maximize success. Medications to assist with smoking cessation:  Nicotine Patch Other plans: Gave prescription for 6 weeks of 14 mg nicotine patches, told to call clinic for 7 mg patches for 2 weeks if she is successful.

## 2014-07-14 NOTE — Assessment & Plan Note (Signed)
Symptoms most consistent with the common cold.  No fever or headache, and symptoms have been going on for greater than 48 hours.  She does not have other significant comorbidities.  Will treat supportively. -Tylenol PRN for fever or pain. -Mucinex DM and Tessalon Perles for cough. -Chlorphen 4 mg q6h PRN for congestion and to help with sleep.  Advised to take prior to going to bed.

## 2014-07-14 NOTE — Progress Notes (Signed)
Case discussed with Dr. Moding at time of visit. We reviewed the resident's history and exam and pertinent patient test results. I agree with the assessment, diagnosis, and plan of care documented in the resident's note. 

## 2014-08-04 ENCOUNTER — Other Ambulatory Visit: Payer: Self-pay | Admitting: Internal Medicine

## 2014-08-12 ENCOUNTER — Emergency Department (HOSPITAL_COMMUNITY)
Admission: EM | Admit: 2014-08-12 | Discharge: 2014-08-12 | Disposition: A | Discharge status: Home / Self Care | Payer: Medicare Other | Attending: Emergency Medicine | Admitting: Emergency Medicine

## 2014-08-12 ENCOUNTER — Emergency Department (HOSPITAL_COMMUNITY): Payer: Medicare Other

## 2014-08-12 ENCOUNTER — Encounter (HOSPITAL_COMMUNITY): Payer: Self-pay | Admitting: Emergency Medicine

## 2014-08-12 DIAGNOSIS — W19XXXA Unspecified fall, initial encounter: Secondary | ICD-10-CM

## 2014-08-12 DIAGNOSIS — S80212A Abrasion, left knee, initial encounter: Secondary | ICD-10-CM

## 2014-08-12 DIAGNOSIS — Z7982 Long term (current) use of aspirin: Secondary | ICD-10-CM | POA: Insufficient documentation

## 2014-08-12 DIAGNOSIS — S0990XA Unspecified injury of head, initial encounter: Secondary | ICD-10-CM | POA: Diagnosis present

## 2014-08-12 DIAGNOSIS — W01198A Fall on same level from slipping, tripping and stumbling with subsequent striking against other object, initial encounter: Secondary | ICD-10-CM | POA: Diagnosis not present

## 2014-08-12 DIAGNOSIS — M545 Low back pain, unspecified: Secondary | ICD-10-CM

## 2014-08-12 DIAGNOSIS — Z7952 Long term (current) use of systemic steroids: Secondary | ICD-10-CM | POA: Insufficient documentation

## 2014-08-12 DIAGNOSIS — Y998 Other external cause status: Secondary | ICD-10-CM | POA: Insufficient documentation

## 2014-08-12 DIAGNOSIS — Y9389 Activity, other specified: Secondary | ICD-10-CM | POA: Diagnosis not present

## 2014-08-12 DIAGNOSIS — S3992XA Unspecified injury of lower back, initial encounter: Secondary | ICD-10-CM | POA: Diagnosis not present

## 2014-08-12 DIAGNOSIS — Z720 Tobacco use: Secondary | ICD-10-CM | POA: Insufficient documentation

## 2014-08-12 DIAGNOSIS — Y92009 Unspecified place in unspecified non-institutional (private) residence as the place of occurrence of the external cause: Secondary | ICD-10-CM | POA: Insufficient documentation

## 2014-08-12 DIAGNOSIS — Z79899 Other long term (current) drug therapy: Secondary | ICD-10-CM | POA: Diagnosis not present

## 2014-08-12 DIAGNOSIS — S0081XA Abrasion of other part of head, initial encounter: Secondary | ICD-10-CM | POA: Diagnosis not present

## 2014-08-12 DIAGNOSIS — N189 Chronic kidney disease, unspecified: Secondary | ICD-10-CM | POA: Diagnosis not present

## 2014-08-12 MED ORDER — OXYCODONE-ACETAMINOPHEN 5-325 MG PO TABS: 1.0000 | ORAL_TABLET | Freq: Four times a day (QID) | ORAL | Status: DC | PRN

## 2014-08-12 NOTE — Discharge Instructions (Signed)
Return to the ED with any concerns including vomiting, seizure activity, difficulty breathing, fainting, decreased level of alertness/lethargy, or any other alarming symptoms  The CT scan of your head showed the following results, this is an incidental finding and not related to your ED visit--- you should show this to your primary doctor - and arrange for followup if any is needed:  No acute intracranial abnormalities. Small extra-axial lesion in the right parietal region possibly representing a meningioma.

## 2014-08-12 NOTE — ED Notes (Signed)
Pt agitated that she was told by transport she had CT scan and xray done at same time and wasn't pt had to be transported back to CT because they were not ready. This RN apologized for the miscommunication. Pt verbalized understanding;

## 2014-08-12 NOTE — ED Notes (Signed)
Pt reports she fell on Sunday hitting her head twice on the pavement- no loc. Small abrasion noted to forehead. Pt is not on blood thinners. Pt c/o dizziness and weakness since the fall.

## 2014-08-12 NOTE — ED Notes (Signed)
Patient transported to CT 
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
## 2014-08-18 ENCOUNTER — Other Ambulatory Visit: Payer: Self-pay | Admitting: *Deleted

## 2014-08-18 MED ORDER — PRAVASTATIN SODIUM 80 MG PO TABS: 80.0000 mg | ORAL_TABLET | Freq: Every evening | ORAL | Status: DC

## 2014-08-18 NOTE — Telephone Encounter (Signed)
Please do # 90 for 3 month supply

## 2014-08-19 MED ORDER — PRAVASTATIN SODIUM 80 MG PO TABS: 80.0000 mg | ORAL_TABLET | Freq: Every evening | ORAL | Status: DC

## 2014-08-19 NOTE — Addendum Note (Signed)
Addended by: [name] on: 08/19/2014 01:50 PM   Modules accepted: Orders

## 2014-08-27 ENCOUNTER — Telehealth: Payer: Self-pay | Admitting: Internal Medicine

## 2014-08-27 NOTE — Telephone Encounter (Signed)
Call to patient to confirm appointment for 08/31/14 at 1:15 lmtcb

## 2014-08-31 ENCOUNTER — Ambulatory Visit (INDEPENDENT_AMBULATORY_CARE_PROVIDER_SITE_OTHER): Payer: Medicare Other | Admitting: Internal Medicine

## 2014-08-31 ENCOUNTER — Encounter: Payer: Self-pay | Admitting: Internal Medicine

## 2014-08-31 VITALS — BP 123/82 | HR 90 | Temp 98.1°F | Ht 70.0 in | Wt 191.6 lb

## 2014-08-31 DIAGNOSIS — F17200 Nicotine dependence, unspecified, uncomplicated: Secondary | ICD-10-CM

## 2014-08-31 DIAGNOSIS — I10 Essential (primary) hypertension: Secondary | ICD-10-CM

## 2014-08-31 DIAGNOSIS — J069 Acute upper respiratory infection, unspecified: Secondary | ICD-10-CM

## 2014-08-31 DIAGNOSIS — Z23 Encounter for immunization: Secondary | ICD-10-CM | POA: Diagnosis not present

## 2014-08-31 DIAGNOSIS — Z720 Tobacco use: Secondary | ICD-10-CM

## 2014-08-31 DIAGNOSIS — Z0000 Encounter for general adult medical examination without abnormal findings: Secondary | ICD-10-CM

## 2014-08-31 MED ORDER — DOXYCYCLINE HYCLATE 100 MG PO CAPS: 100.0000 mg | ORAL_CAPSULE | Freq: Two times a day (BID) | ORAL | Status: DC

## 2014-08-31 NOTE — Assessment & Plan Note (Signed)
BP Readings from Last 3 Encounters:  08/31/14 123/82  08/12/14 136/85  07/14/14 133/90    Lab Results  Component Value Date   NA 140 12/30/2012   K 4.3 12/30/2012   CREATININE 0.97 12/30/2012    Assessment: Blood pressure control: controlled Progress toward BP goal:  at goal Comments: Patient states that she is compliant with her amlodipine 5 mg daily.  Plan: Medications:  continue current medications Educational resources provided: brochure (denies need) Self management tools provided: home blood pressure logbook Other plans: None

## 2014-08-31 NOTE — Patient Instructions (Addendum)
I have prescribed antibiotic called doxycycline to see if it helps with her cough. Please see [place] back here in clinic if your cough does not improve after treatment.  Given your smoking history, I will also send you for a low dose CT study to evaluate your lungs.  General Instructions:   Please bring your medicines with you each time you come to clinic.  Medicines may include prescription medications, over-the-counter medications, herbal remedies, eye drops, vitamins, or other pills.   Progress Toward Treatment Goals:  Treatment Goal 08/31/2014  Blood pressure at goal  Stop smoking smoking less    Self Care Goals & Plans:  Self Care Goal 08/31/2014  Manage my medications take my medicines as prescribed; bring my medications to every visit; refill my medications on time  Monitor my health -  Eat healthy foods drink diet soda or water instead of juice or soda; eat more vegetables; eat foods that are low in salt; eat baked foods instead of fried foods  Be physically active -  Stop smoking -    No flowsheet data found.   Care Management & Community Referrals:  Referral 03/16/2014  Referrals made to community resources smoking cessation

## 2014-08-31 NOTE — Progress Notes (Signed)
   Subjective:    Patient ID: Laura [name], female    DOB: 11/22/1945, 69 y.o.   MRN: [id number]  HPI  Patient is a 69 year old with a history of hypertension, tobacco abuse, chest pain, depression, hyperlipidemia who presents to clinic for blood pressure follow-up.   Please see problem based charting for more details.  Review of Systems  Constitutional: Negative for fever and chills.  HENT: Negative for rhinorrhea and sore throat.   Eyes: Negative for visual disturbance.  Respiratory: Positive for cough. Negative for shortness of breath.   Cardiovascular: Negative for chest pain and palpitations.  Gastrointestinal: Negative for nausea, vomiting, abdominal pain, diarrhea, constipation and blood in stool.  Genitourinary: Negative for dysuria and hematuria.  Neurological: Negative for syncope.       Objective:   Physical Exam  Constitutional: She is oriented to person, place, and time. She appears well-developed and well-nourished. No distress.  HENT:  Head: Normocephalic and atraumatic.  Eyes: EOM are normal. Pupils are equal, round, and reactive to light. Left eye exhibits no discharge.  Neck: Normal range of motion. Neck supple. No thyromegaly present.  Cardiovascular: Normal rate and regular rhythm.  Exam reveals no gallop and no friction rub.   No murmur heard. Pulmonary/Chest: Effort normal and breath sounds normal. No respiratory distress. She has no wheezes. She has no rales.  Abdominal: Soft. Bowel sounds are normal. She exhibits no distension. There is no tenderness. There is no rebound.  Musculoskeletal: She exhibits no edema.  Neurological: She is alert and oriented to person, place, and time. No cranial nerve deficit.  Skin: Skin is warm and dry. No rash noted.  Psychiatric: She has a normal mood and affect. Thought content normal.          Assessment & Plan:  Please see problem based charting for more details.

## 2014-08-31 NOTE — Assessment & Plan Note (Signed)
Patient continues to report a worsening cough that is productive of white sputum and occasionally scant blood. Patient denies any associated fevers, chills, chest pain, nasal congestion, postnasal drip. Patient continues to smoke cigarettes. Patient was seen in January 2016 for the same symptoms and managed conservatively at that point. Patient states that her symptoms have worsened since that visit. -Given smoking history, will do low-dose CT for screening of lung cancer. -Not have any pulmonary function tests on file. Would consider obtaining some at a future visit. -Doxycycline 100 mg twice a day for 7 days for a presumed COPD exacerbation.

## 2014-08-31 NOTE — Assessment & Plan Note (Addendum)
Patient received PCV 13 vaccine in clinic today. Will revisit DEXA scan at a future visit.

## 2014-08-31 NOTE — Progress Notes (Signed)
Internal Medicine Clinic Attending  Case discussed with Dr. Ngo at the time of the visit.  We reviewed the resident's history and exam and pertinent patient test results.  I agree with the assessment, diagnosis, and plan of care documented in the resident's note. 

## 2014-08-31 NOTE — Assessment & Plan Note (Signed)
Patient continues to smoke and was counseled on the importance of smoking cessation in the management of her cough and overall risk for heart disease, lung cancer, stroke, lung disease.

## 2014-09-01 ENCOUNTER — Telehealth: Payer: Self-pay | Admitting: *Deleted

## 2014-09-01 NOTE — Telephone Encounter (Signed)
Pt was seen in office on 3/14.  She forgot to ask if it would be Okay for her to take omega 3 fish oil soft gels 1,000 mg She was told she needed to consult with here PCP before starting this med. Pt # [id number]

## 2014-09-05 NOTE — Telephone Encounter (Signed)
Patient not able to be reached after repeated calls. I left a message stating that it would overall be ok to start if she felt very strongly about it. However, she has normal triglycerides and an elevated LDL on her last lipid panel, so I recommended that the patient only take it if she felt very strongly about doing so.

## 2014-09-18 DIAGNOSIS — M4806 Spinal stenosis, lumbar region: Secondary | ICD-10-CM | POA: Diagnosis not present

## 2014-09-19 DIAGNOSIS — M4806 Spinal stenosis, lumbar region: Secondary | ICD-10-CM | POA: Diagnosis not present

## 2014-09-20 DIAGNOSIS — M4806 Spinal stenosis, lumbar region: Secondary | ICD-10-CM | POA: Diagnosis not present

## 2014-09-21 DIAGNOSIS — M4806 Spinal stenosis, lumbar region: Secondary | ICD-10-CM | POA: Diagnosis not present

## 2014-09-22 DIAGNOSIS — M4806 Spinal stenosis, lumbar region: Secondary | ICD-10-CM | POA: Diagnosis not present

## 2014-09-23 DIAGNOSIS — M4806 Spinal stenosis, lumbar region: Secondary | ICD-10-CM | POA: Diagnosis not present

## 2014-09-24 DIAGNOSIS — M4806 Spinal stenosis, lumbar region: Secondary | ICD-10-CM | POA: Diagnosis not present

## 2014-09-25 DIAGNOSIS — M4806 Spinal stenosis, lumbar region: Secondary | ICD-10-CM | POA: Diagnosis not present

## 2014-09-26 DIAGNOSIS — M4806 Spinal stenosis, lumbar region: Secondary | ICD-10-CM | POA: Diagnosis not present

## 2014-09-27 DIAGNOSIS — M4806 Spinal stenosis, lumbar region: Secondary | ICD-10-CM | POA: Diagnosis not present

## 2014-09-28 DIAGNOSIS — M4806 Spinal stenosis, lumbar region: Secondary | ICD-10-CM | POA: Diagnosis not present

## 2014-09-29 DIAGNOSIS — M4806 Spinal stenosis, lumbar region: Secondary | ICD-10-CM | POA: Diagnosis not present

## 2014-09-30 DIAGNOSIS — M4806 Spinal stenosis, lumbar region: Secondary | ICD-10-CM | POA: Diagnosis not present

## 2014-10-01 DIAGNOSIS — M4806 Spinal stenosis, lumbar region: Secondary | ICD-10-CM | POA: Diagnosis not present

## 2014-10-02 DIAGNOSIS — M4806 Spinal stenosis, lumbar region: Secondary | ICD-10-CM | POA: Diagnosis not present

## 2014-10-03 DIAGNOSIS — M4806 Spinal stenosis, lumbar region: Secondary | ICD-10-CM | POA: Diagnosis not present

## 2014-10-04 DIAGNOSIS — M4806 Spinal stenosis, lumbar region: Secondary | ICD-10-CM | POA: Diagnosis not present

## 2014-10-05 DIAGNOSIS — M4806 Spinal stenosis, lumbar region: Secondary | ICD-10-CM | POA: Diagnosis not present

## 2014-10-06 DIAGNOSIS — M4806 Spinal stenosis, lumbar region: Secondary | ICD-10-CM | POA: Diagnosis not present

## 2014-10-07 DIAGNOSIS — M4806 Spinal stenosis, lumbar region: Secondary | ICD-10-CM | POA: Diagnosis not present

## 2014-10-08 DIAGNOSIS — M4806 Spinal stenosis, lumbar region: Secondary | ICD-10-CM | POA: Diagnosis not present

## 2014-10-09 DIAGNOSIS — M4806 Spinal stenosis, lumbar region: Secondary | ICD-10-CM | POA: Diagnosis not present

## 2014-10-10 DIAGNOSIS — M4806 Spinal stenosis, lumbar region: Secondary | ICD-10-CM | POA: Diagnosis not present

## 2014-10-11 DIAGNOSIS — M4806 Spinal stenosis, lumbar region: Secondary | ICD-10-CM | POA: Diagnosis not present

## 2014-10-12 DIAGNOSIS — M4806 Spinal stenosis, lumbar region: Secondary | ICD-10-CM | POA: Diagnosis not present

## 2014-10-13 DIAGNOSIS — M4806 Spinal stenosis, lumbar region: Secondary | ICD-10-CM | POA: Diagnosis not present

## 2014-10-14 DIAGNOSIS — M4806 Spinal stenosis, lumbar region: Secondary | ICD-10-CM | POA: Diagnosis not present

## 2014-10-15 DIAGNOSIS — M4806 Spinal stenosis, lumbar region: Secondary | ICD-10-CM | POA: Diagnosis not present

## 2014-10-16 DIAGNOSIS — M4806 Spinal stenosis, lumbar region: Secondary | ICD-10-CM | POA: Diagnosis not present

## 2014-10-17 DIAGNOSIS — M4806 Spinal stenosis, lumbar region: Secondary | ICD-10-CM | POA: Diagnosis not present

## 2014-10-18 DIAGNOSIS — M4806 Spinal stenosis, lumbar region: Secondary | ICD-10-CM | POA: Diagnosis not present

## 2014-10-19 DIAGNOSIS — M4806 Spinal stenosis, lumbar region: Secondary | ICD-10-CM | POA: Diagnosis not present

## 2014-10-20 DIAGNOSIS — M4806 Spinal stenosis, lumbar region: Secondary | ICD-10-CM | POA: Diagnosis not present

## 2014-10-21 DIAGNOSIS — M4806 Spinal stenosis, lumbar region: Secondary | ICD-10-CM | POA: Diagnosis not present

## 2014-10-22 DIAGNOSIS — M4806 Spinal stenosis, lumbar region: Secondary | ICD-10-CM | POA: Diagnosis not present

## 2014-10-23 DIAGNOSIS — M4806 Spinal stenosis, lumbar region: Secondary | ICD-10-CM | POA: Diagnosis not present

## 2014-10-24 DIAGNOSIS — M4806 Spinal stenosis, lumbar region: Secondary | ICD-10-CM | POA: Diagnosis not present

## 2014-10-25 DIAGNOSIS — M4806 Spinal stenosis, lumbar region: Secondary | ICD-10-CM | POA: Diagnosis not present

## 2014-10-26 DIAGNOSIS — M4806 Spinal stenosis, lumbar region: Secondary | ICD-10-CM | POA: Diagnosis not present

## 2014-10-27 ENCOUNTER — Ambulatory Visit: Payer: Self-pay | Admitting: Internal Medicine

## 2014-10-27 ENCOUNTER — Encounter: Payer: Self-pay | Admitting: *Deleted

## 2014-10-27 DIAGNOSIS — M4806 Spinal stenosis, lumbar region: Secondary | ICD-10-CM | POA: Diagnosis not present

## 2014-10-28 DIAGNOSIS — M4806 Spinal stenosis, lumbar region: Secondary | ICD-10-CM | POA: Diagnosis not present

## 2014-10-29 DIAGNOSIS — M4806 Spinal stenosis, lumbar region: Secondary | ICD-10-CM | POA: Diagnosis not present

## 2014-10-30 DIAGNOSIS — M4806 Spinal stenosis, lumbar region: Secondary | ICD-10-CM | POA: Diagnosis not present

## 2014-10-31 DIAGNOSIS — M4806 Spinal stenosis, lumbar region: Secondary | ICD-10-CM | POA: Diagnosis not present

## 2014-11-01 DIAGNOSIS — M4806 Spinal stenosis, lumbar region: Secondary | ICD-10-CM | POA: Diagnosis not present

## 2014-11-02 DIAGNOSIS — M4806 Spinal stenosis, lumbar region: Secondary | ICD-10-CM | POA: Diagnosis not present

## 2014-11-03 DIAGNOSIS — M4806 Spinal stenosis, lumbar region: Secondary | ICD-10-CM | POA: Diagnosis not present

## 2014-11-04 DIAGNOSIS — M5136 Other intervertebral disc degeneration, lumbar region: Secondary | ICD-10-CM | POA: Diagnosis not present

## 2014-11-04 DIAGNOSIS — M4806 Spinal stenosis, lumbar region: Secondary | ICD-10-CM | POA: Diagnosis not present

## 2014-11-04 DIAGNOSIS — G894 Chronic pain syndrome: Secondary | ICD-10-CM | POA: Diagnosis not present

## 2014-11-04 DIAGNOSIS — M5442 Lumbago with sciatica, left side: Secondary | ICD-10-CM | POA: Diagnosis not present

## 2014-11-05 DIAGNOSIS — M4806 Spinal stenosis, lumbar region: Secondary | ICD-10-CM | POA: Diagnosis not present

## 2014-11-06 DIAGNOSIS — M4806 Spinal stenosis, lumbar region: Secondary | ICD-10-CM | POA: Diagnosis not present

## 2014-11-07 DIAGNOSIS — M4806 Spinal stenosis, lumbar region: Secondary | ICD-10-CM | POA: Diagnosis not present

## 2014-11-08 DIAGNOSIS — M4806 Spinal stenosis, lumbar region: Secondary | ICD-10-CM | POA: Diagnosis not present

## 2014-11-09 DIAGNOSIS — M4806 Spinal stenosis, lumbar region: Secondary | ICD-10-CM | POA: Diagnosis not present

## 2014-11-10 DIAGNOSIS — M4806 Spinal stenosis, lumbar region: Secondary | ICD-10-CM | POA: Diagnosis not present

## 2014-11-11 DIAGNOSIS — M4806 Spinal stenosis, lumbar region: Secondary | ICD-10-CM | POA: Diagnosis not present

## 2014-11-12 DIAGNOSIS — M4806 Spinal stenosis, lumbar region: Secondary | ICD-10-CM | POA: Diagnosis not present

## 2014-11-13 DIAGNOSIS — M4806 Spinal stenosis, lumbar region: Secondary | ICD-10-CM | POA: Diagnosis not present

## 2014-11-14 DIAGNOSIS — M4806 Spinal stenosis, lumbar region: Secondary | ICD-10-CM | POA: Diagnosis not present

## 2014-11-15 DIAGNOSIS — M4806 Spinal stenosis, lumbar region: Secondary | ICD-10-CM | POA: Diagnosis not present

## 2014-11-16 DIAGNOSIS — M4806 Spinal stenosis, lumbar region: Secondary | ICD-10-CM | POA: Diagnosis not present

## 2014-11-17 DIAGNOSIS — M4806 Spinal stenosis, lumbar region: Secondary | ICD-10-CM | POA: Diagnosis not present

## 2014-11-18 DIAGNOSIS — M4806 Spinal stenosis, lumbar region: Secondary | ICD-10-CM | POA: Diagnosis not present

## 2014-11-19 DIAGNOSIS — M4806 Spinal stenosis, lumbar region: Secondary | ICD-10-CM | POA: Diagnosis not present

## 2014-11-20 DIAGNOSIS — M4806 Spinal stenosis, lumbar region: Secondary | ICD-10-CM | POA: Diagnosis not present

## 2014-11-21 DIAGNOSIS — M4806 Spinal stenosis, lumbar region: Secondary | ICD-10-CM | POA: Diagnosis not present

## 2014-11-22 DIAGNOSIS — M4806 Spinal stenosis, lumbar region: Secondary | ICD-10-CM | POA: Diagnosis not present

## 2014-11-23 DIAGNOSIS — M4806 Spinal stenosis, lumbar region: Secondary | ICD-10-CM | POA: Diagnosis not present

## 2014-11-24 DIAGNOSIS — M4806 Spinal stenosis, lumbar region: Secondary | ICD-10-CM | POA: Diagnosis not present

## 2014-11-25 DIAGNOSIS — M4806 Spinal stenosis, lumbar region: Secondary | ICD-10-CM | POA: Diagnosis not present

## 2014-11-26 DIAGNOSIS — M4806 Spinal stenosis, lumbar region: Secondary | ICD-10-CM | POA: Diagnosis not present

## 2014-11-27 ENCOUNTER — Other Ambulatory Visit: Payer: Self-pay | Admitting: Internal Medicine

## 2014-11-27 DIAGNOSIS — M4806 Spinal stenosis, lumbar region: Secondary | ICD-10-CM | POA: Diagnosis not present

## 2014-11-27 MED ORDER — AMLODIPINE BESYLATE 5 MG PO TABS: 5.0000 mg | ORAL_TABLET | Freq: Every day | ORAL | Status: DC

## 2014-11-28 DIAGNOSIS — M4806 Spinal stenosis, lumbar region: Secondary | ICD-10-CM | POA: Diagnosis not present

## 2014-11-29 DIAGNOSIS — M4806 Spinal stenosis, lumbar region: Secondary | ICD-10-CM | POA: Diagnosis not present

## 2014-11-30 DIAGNOSIS — M4806 Spinal stenosis, lumbar region: Secondary | ICD-10-CM | POA: Diagnosis not present

## 2014-12-01 DIAGNOSIS — M4806 Spinal stenosis, lumbar region: Secondary | ICD-10-CM | POA: Diagnosis not present

## 2014-12-02 DIAGNOSIS — M4806 Spinal stenosis, lumbar region: Secondary | ICD-10-CM | POA: Diagnosis not present

## 2014-12-03 DIAGNOSIS — M4806 Spinal stenosis, lumbar region: Secondary | ICD-10-CM | POA: Diagnosis not present

## 2014-12-04 ENCOUNTER — Ambulatory Visit (HOSPITAL_COMMUNITY): Payer: Medicare Other

## 2014-12-04 DIAGNOSIS — M4806 Spinal stenosis, lumbar region: Secondary | ICD-10-CM | POA: Diagnosis not present

## 2014-12-05 DIAGNOSIS — M4806 Spinal stenosis, lumbar region: Secondary | ICD-10-CM | POA: Diagnosis not present

## 2014-12-06 DIAGNOSIS — M4806 Spinal stenosis, lumbar region: Secondary | ICD-10-CM | POA: Diagnosis not present

## 2014-12-07 ENCOUNTER — Telehealth: Payer: Self-pay | Admitting: Internal Medicine

## 2014-12-07 NOTE — Telephone Encounter (Signed)
Agree with appointment

## 2014-12-07 NOTE — Telephone Encounter (Signed)
Pt called stating when she walks she has a pain on side of her leg with radiation to left hip and under rib cage. She notes a  she has a bulge at rib cage.  Onset on and off for a month but seems to be getting worse.  Pt scheduled for OV tomorrow for evaluation.

## 2014-12-07 NOTE — Telephone Encounter (Signed)
Patient states she has a strange feeling in her left leg.

## 2014-12-08 ENCOUNTER — Ambulatory Visit (INDEPENDENT_AMBULATORY_CARE_PROVIDER_SITE_OTHER): Payer: Medicare Other | Admitting: Internal Medicine

## 2014-12-08 ENCOUNTER — Encounter: Payer: Self-pay | Admitting: Internal Medicine

## 2014-12-08 VITALS — BP 138/73 | HR 91 | Temp 98.2°F | Ht 70.0 in | Wt 194.7 lb

## 2014-12-08 DIAGNOSIS — I10 Essential (primary) hypertension: Secondary | ICD-10-CM | POA: Diagnosis not present

## 2014-12-08 DIAGNOSIS — M5442 Lumbago with sciatica, left side: Secondary | ICD-10-CM

## 2014-12-08 DIAGNOSIS — R1032 Left lower quadrant pain: Secondary | ICD-10-CM

## 2014-12-08 DIAGNOSIS — G8929 Other chronic pain: Secondary | ICD-10-CM | POA: Diagnosis not present

## 2014-12-08 DIAGNOSIS — M545 Low back pain: Secondary | ICD-10-CM | POA: Diagnosis not present

## 2014-12-08 MED ORDER — CYCLOBENZAPRINE HCL 5 MG PO TABS: 5.0000 mg | ORAL_TABLET | Freq: Every day | ORAL | Status: AC

## 2014-12-08 NOTE — Patient Instructions (Signed)
1. Please schedule a follow up appointment for 3 months.   2. Please take all medications as previously prescribed with the following changes:  Take Flexeril 5 mg at night for muscle spasms.   Use heating pad on left side for intermittent back pain.   Please follow up w/ pain doctor at [place].  3. If you have worsening of your symptoms or new symptoms arise, please call the clinic ([phone]), or go to the ER immediately if symptoms are severe.

## 2014-12-08 NOTE — Progress Notes (Signed)
Subjective:   Patient ID: MARRAH VANEVERY female   DOB: January 02, 1946 69 y.o.   MRN: [id number]  HPI: Ms. GOLDEN EMILE is a 69 y.o. female w/ PMHx of CKD, GERD, HTN, HLD, stress incontinence, and depression, presents to the clinic today for an acute visit for left leg and flank pain. Patient has had back and leg issue for quite some time but notes a new pain in her left side that she states is a sharp pain, radiating around from her back into the left portion of her abdomen. She denies any fatigue, lethargy, fever, chills, nausea, vomiting, change in bowel habits, dysuria, hematuria, LE weakness, numbness, or bowel or bladder incontinence. Patient has been seeing Dr. Nelva Bush at [facility] for pain management and spinal injections for chronic back pain but feel that this pain is new. Patient also notes an asymmetry in her hips that she never noticed before. On review of lumbar spinal XR's, patient has known leftward scoliosis which likely accounts for this.   Current Outpatient Prescriptions  Medication Sig Dispense Refill  . amLODipine (NORVASC) 5 MG tablet Take 1 tablet (5 mg total) by mouth daily. 90 tablet 3  . aspirin EC 81 MG tablet Take 1 tablet (81 mg total) by mouth daily. 150 tablet 2  . chlorpheniramine (CHLORPHEN) 4 MG tablet Take 1 tablet (4 mg total) by mouth every 6 (six) hours as needed for allergies. 14 tablet 0  . gabapentin (NEURONTIN) 600 MG tablet TAKE 1 TABLET (600 MG TOTAL) BY MOUTH 3 (THREE) TIMES DAILY. 270 tablet 3  . loratadine (CLARITIN) 10 MG tablet Take 1 tablet (10 mg total) by mouth daily. 30 tablet 3  . nicotine (NICODERM CQ - DOSED IN MG/24 HOURS) 14 mg/24hr patch Place 1 patch (14 mg total) onto the skin daily. 42 patch 0  . oxybutynin (DITROPAN) 5 MG tablet Take 1 tablet (5 mg total) by mouth 3 (three) times daily. 270 tablet 4  . oxyCODONE-acetaminophen (PERCOCET/ROXICET) 5-325 MG per tablet Take 1-2 tablets by mouth every 6 (six) hours as needed for  severe pain. 15 tablet 0  . pantoprazole (PROTONIX) 40 MG tablet Take 1 tablet (40 mg total) by mouth daily. 90 tablet 2  . pravastatin (PRAVACHOL) 80 MG tablet Take 1 tablet (80 mg total) by mouth every evening. 90 tablet 4  . triamcinolone (NASACORT) 55 MCG/ACT AERO nasal inhaler Place 2 sprays into the nose daily. 1 Inhaler 12   No current facility-administered medications for this visit.   Review of Systems  General: Denies fever, diaphoresis, appetite change, and fatigue.  Respiratory: Denies SOB, cough, and wheezing.   Cardiovascular: Denies chest pain and palpitations.  Gastrointestinal: Denies nausea, vomiting, abdominal pain, and diarrhea Musculoskeletal: Positive for back pain, left side and leg pain.  Neurological: Denies dizziness, syncope, weakness, lightheadedness, and headaches.  Psychiatric/Behavioral: Denies mood changes, sleep disturbance, and agitation.   Objective:   Physical Exam: Filed Vitals:   12/08/14 1539  BP: 138/73  Pulse: 91  Temp: 98.2 F (36.8 C)  TempSrc: Oral  Height: 5\' 10"  (1.778 m)  Weight: 194 lb 11.2 oz (88.315 kg)  SpO2: 100%    General: AA female, alert, cooperative, NAD. Walks w/ a cane.  HEENT: PERRL, EOMI. Moist mucus membranes Neck: Full range of motion without pain, supple, no lymphadenopathy or carotid bruits Lungs: Clear to ascultation bilaterally, normal work of respiration, no wheezes, rales, rhonchi Heart: RRR, no murmurs, gallops, or rubs Abdomen: Soft, non-tender, non-distended, BS +  Extremities: No cyanosis, clubbing, or edema. Straight leg raise negative bilaterally. No limitations of ROM in hips bilaterally.  Back: No spinal tenderness, no limitations of ROM.  Neurologic: Alert & oriented x3, cranial nerves II-XII intact, strength grossly intact, sensation intact to light touch   Assessment & Plan:   Please see problem based assessment and plan.

## 2014-12-09 ENCOUNTER — Other Ambulatory Visit: Payer: Self-pay | Admitting: Internal Medicine

## 2014-12-09 ENCOUNTER — Ambulatory Visit (HOSPITAL_COMMUNITY)
Admission: RE | Admit: 2014-12-09 | Discharge: 2014-12-09 | Disposition: A | Discharge status: Home / Self Care | Payer: Medicare Other | Source: Ambulatory Visit | Attending: Internal Medicine | Admitting: Internal Medicine

## 2014-12-09 DIAGNOSIS — R918 Other nonspecific abnormal finding of lung field: Secondary | ICD-10-CM | POA: Insufficient documentation

## 2014-12-09 DIAGNOSIS — I2510 Atherosclerotic heart disease of native coronary artery without angina pectoris: Secondary | ICD-10-CM | POA: Diagnosis not present

## 2014-12-09 DIAGNOSIS — F17200 Nicotine dependence, unspecified, uncomplicated: Secondary | ICD-10-CM

## 2014-12-09 DIAGNOSIS — N281 Cyst of kidney, acquired: Secondary | ICD-10-CM | POA: Insufficient documentation

## 2014-12-09 DIAGNOSIS — Z720 Tobacco use: Secondary | ICD-10-CM

## 2014-12-09 DIAGNOSIS — J439 Emphysema, unspecified: Secondary | ICD-10-CM | POA: Diagnosis not present

## 2014-12-09 DIAGNOSIS — I700 Atherosclerosis of aorta: Secondary | ICD-10-CM | POA: Diagnosis not present

## 2014-12-09 DIAGNOSIS — R079 Chest pain, unspecified: Secondary | ICD-10-CM | POA: Diagnosis not present

## 2014-12-09 NOTE — Assessment & Plan Note (Signed)
Patient w/ left flank/back pain, radiates into her left abdomen. Patient has a known h/o back problems, including known scoliosis. Suspect patient's pain is related to lumbar radiculopathy vs muscle spasms related to her known back issues and scoliosis. Exam quite benign. Do not suspect spinal stenosis, hip pathology, or abdominal issues as etiology of her pain. ROM intact in spine, hips, and legs bilaterally. Sees Dr. Nelva Bush for chronic back pain management, receives spinal injections for this.  -Advised heating pad for possible spasms. Give Rx for Flexeril 5 mg qhs -Return to Dr. Nelva Bush for further intervention.

## 2014-12-09 NOTE — Assessment & Plan Note (Signed)
BP Readings from Last 3 Encounters:  12/08/14 138/73  08/31/14 123/82  08/12/14 136/85    Lab Results  Component Value Date   NA 140 12/30/2012   K 4.3 12/30/2012   CREATININE 0.97 12/30/2012    Assessment: Blood pressure control:  At goal.  Comments: Takes Norvasc 5 mg daily  Plan: Medications:  continue current medications Educational resources provided: brochure Self management tools provided: home blood pressure logbook (denies) Other plans: RTC in 3 months

## 2014-12-10 NOTE — Addendum Note (Signed)
Addended by: Gilles Chiquito B on: 12/10/2014 02:45 PM   Modules accepted: Level of Service

## 2014-12-10 NOTE — Progress Notes (Signed)
[facility] Attending  Case discussed with Dr. Jones soon after the resident saw the patient.  We reviewed the resident's history and exam and pertinent patient test results.  I agree with the assessment, diagnosis, and plan of care documented in the resident's note. 

## 2014-12-25 ENCOUNTER — Telehealth: Payer: Self-pay | Admitting: Internal Medicine

## 2014-12-25 NOTE — Telephone Encounter (Signed)
Patient is calling to let us know that she is still having the same pains in her stomach/side/back.

## 2014-12-25 NOTE — Telephone Encounter (Signed)
appt scheduled for mon 12/28/2014 at 0915 dr Redmond Pulling, pt states pain in her abd, L side and lower back are no better, wants to find out what is wrong

## 2014-12-28 ENCOUNTER — Ambulatory Visit: Payer: Self-pay | Admitting: Internal Medicine

## 2015-01-05 DIAGNOSIS — I10 Essential (primary) hypertension: Secondary | ICD-10-CM | POA: Diagnosis not present

## 2015-01-05 DIAGNOSIS — Z136 Encounter for screening for cardiovascular disorders: Secondary | ICD-10-CM | POA: Diagnosis not present

## 2015-01-05 DIAGNOSIS — M545 Low back pain: Secondary | ICD-10-CM | POA: Diagnosis not present

## 2015-01-05 DIAGNOSIS — E785 Hyperlipidemia, unspecified: Secondary | ICD-10-CM | POA: Diagnosis not present

## 2015-01-05 DIAGNOSIS — I119 Hypertensive heart disease without heart failure: Secondary | ICD-10-CM | POA: Diagnosis not present

## 2015-01-05 DIAGNOSIS — Z720 Tobacco use: Secondary | ICD-10-CM | POA: Diagnosis not present

## 2015-01-06 DIAGNOSIS — M5442 Lumbago with sciatica, left side: Secondary | ICD-10-CM | POA: Diagnosis not present

## 2015-01-06 DIAGNOSIS — M5136 Other intervertebral disc degeneration, lumbar region: Secondary | ICD-10-CM | POA: Diagnosis not present

## 2015-01-06 DIAGNOSIS — G894 Chronic pain syndrome: Secondary | ICD-10-CM | POA: Diagnosis not present

## 2015-01-11 DIAGNOSIS — Z01812 Encounter for preprocedural laboratory examination: Secondary | ICD-10-CM | POA: Diagnosis not present

## 2015-01-13 DIAGNOSIS — M5442 Lumbago with sciatica, left side: Secondary | ICD-10-CM | POA: Diagnosis not present

## 2015-01-21 DIAGNOSIS — M5442 Lumbago with sciatica, left side: Secondary | ICD-10-CM | POA: Diagnosis not present

## 2015-01-21 DIAGNOSIS — Z8739 Personal history of other diseases of the musculoskeletal system and connective tissue: Secondary | ICD-10-CM | POA: Diagnosis not present

## 2015-01-21 DIAGNOSIS — G894 Chronic pain syndrome: Secondary | ICD-10-CM | POA: Diagnosis not present

## 2015-01-21 DIAGNOSIS — M5136 Other intervertebral disc degeneration, lumbar region: Secondary | ICD-10-CM | POA: Diagnosis not present

## 2015-02-02 DIAGNOSIS — Z7689 Persons encountering health services in other specified circumstances: Secondary | ICD-10-CM | POA: Diagnosis not present

## 2015-02-02 DIAGNOSIS — I119 Hypertensive heart disease without heart failure: Secondary | ICD-10-CM | POA: Diagnosis not present

## 2015-02-02 DIAGNOSIS — I10 Essential (primary) hypertension: Secondary | ICD-10-CM | POA: Diagnosis not present

## 2015-02-02 DIAGNOSIS — Z720 Tobacco use: Secondary | ICD-10-CM | POA: Diagnosis not present

## 2015-02-02 DIAGNOSIS — E785 Hyperlipidemia, unspecified: Secondary | ICD-10-CM | POA: Diagnosis not present

## 2015-02-02 DIAGNOSIS — M545 Low back pain: Secondary | ICD-10-CM | POA: Diagnosis not present

## 2015-02-05 DIAGNOSIS — I10 Essential (primary) hypertension: Secondary | ICD-10-CM | POA: Diagnosis not present

## 2015-02-05 DIAGNOSIS — I119 Hypertensive heart disease without heart failure: Secondary | ICD-10-CM | POA: Diagnosis not present

## 2015-02-17 DIAGNOSIS — E785 Hyperlipidemia, unspecified: Secondary | ICD-10-CM | POA: Diagnosis not present

## 2015-02-17 DIAGNOSIS — Z720 Tobacco use: Secondary | ICD-10-CM | POA: Diagnosis not present

## 2015-02-17 DIAGNOSIS — M545 Low back pain: Secondary | ICD-10-CM | POA: Diagnosis not present

## 2015-02-17 DIAGNOSIS — I119 Hypertensive heart disease without heart failure: Secondary | ICD-10-CM | POA: Diagnosis not present

## 2015-02-17 DIAGNOSIS — I10 Essential (primary) hypertension: Secondary | ICD-10-CM | POA: Diagnosis not present

## 2015-02-26 DIAGNOSIS — G8929 Other chronic pain: Secondary | ICD-10-CM | POA: Diagnosis not present

## 2015-02-26 DIAGNOSIS — M5442 Lumbago with sciatica, left side: Secondary | ICD-10-CM | POA: Diagnosis not present

## 2015-03-12 DIAGNOSIS — H43813 Vitreous degeneration, bilateral: Secondary | ICD-10-CM | POA: Diagnosis not present

## 2015-03-12 DIAGNOSIS — H16223 Keratoconjunctivitis sicca, not specified as Sjogren's, bilateral: Secondary | ICD-10-CM | POA: Diagnosis not present

## 2015-03-12 DIAGNOSIS — H40023 Open angle with borderline findings, high risk, bilateral: Secondary | ICD-10-CM | POA: Diagnosis not present

## 2015-03-12 DIAGNOSIS — H2513 Age-related nuclear cataract, bilateral: Secondary | ICD-10-CM | POA: Diagnosis not present

## 2015-03-19 DIAGNOSIS — M4726 Other spondylosis with radiculopathy, lumbar region: Secondary | ICD-10-CM | POA: Diagnosis not present

## 2015-03-19 DIAGNOSIS — M5417 Radiculopathy, lumbosacral region: Secondary | ICD-10-CM | POA: Diagnosis not present

## 2015-03-22 DIAGNOSIS — I119 Hypertensive heart disease without heart failure: Secondary | ICD-10-CM | POA: Diagnosis not present

## 2015-03-22 DIAGNOSIS — E785 Hyperlipidemia, unspecified: Secondary | ICD-10-CM | POA: Diagnosis not present

## 2015-03-22 DIAGNOSIS — Z6826 Body mass index (BMI) 26.0-26.9, adult: Secondary | ICD-10-CM | POA: Diagnosis not present

## 2015-03-22 DIAGNOSIS — Z720 Tobacco use: Secondary | ICD-10-CM | POA: Diagnosis not present

## 2015-05-11 ENCOUNTER — Encounter: Payer: Self-pay | Admitting: Student

## 2015-05-21 DIAGNOSIS — M5136 Other intervertebral disc degeneration, lumbar region: Secondary | ICD-10-CM | POA: Diagnosis not present

## 2015-05-21 DIAGNOSIS — G894 Chronic pain syndrome: Secondary | ICD-10-CM | POA: Diagnosis not present

## 2015-05-21 DIAGNOSIS — G8929 Other chronic pain: Secondary | ICD-10-CM | POA: Diagnosis not present

## 2015-05-21 DIAGNOSIS — M5442 Lumbago with sciatica, left side: Secondary | ICD-10-CM | POA: Diagnosis not present

## 2015-05-25 DIAGNOSIS — I10 Essential (primary) hypertension: Secondary | ICD-10-CM | POA: Diagnosis not present

## 2015-05-25 DIAGNOSIS — Z0100 Encounter for examination of eyes and vision without abnormal findings: Secondary | ICD-10-CM | POA: Diagnosis not present

## 2015-05-25 DIAGNOSIS — Z01118 Encounter for examination of ears and hearing with other abnormal findings: Secondary | ICD-10-CM | POA: Diagnosis not present

## 2015-05-25 DIAGNOSIS — Z0000 Encounter for general adult medical examination without abnormal findings: Secondary | ICD-10-CM | POA: Diagnosis not present

## 2015-05-25 DIAGNOSIS — Z131 Encounter for screening for diabetes mellitus: Secondary | ICD-10-CM | POA: Diagnosis not present

## 2015-05-25 DIAGNOSIS — Z23 Encounter for immunization: Secondary | ICD-10-CM | POA: Diagnosis not present

## 2015-05-28 ENCOUNTER — Other Ambulatory Visit: Payer: Self-pay | Admitting: Internal Medicine

## 2015-07-13 DIAGNOSIS — I10 Essential (primary) hypertension: Secondary | ICD-10-CM | POA: Diagnosis not present

## 2015-07-13 DIAGNOSIS — J209 Acute bronchitis, unspecified: Secondary | ICD-10-CM | POA: Diagnosis not present

## 2015-07-13 DIAGNOSIS — R739 Hyperglycemia, unspecified: Secondary | ICD-10-CM | POA: Diagnosis not present

## 2015-07-13 DIAGNOSIS — Z720 Tobacco use: Secondary | ICD-10-CM | POA: Diagnosis not present

## 2015-07-13 DIAGNOSIS — E785 Hyperlipidemia, unspecified: Secondary | ICD-10-CM | POA: Diagnosis not present

## 2015-07-13 DIAGNOSIS — I119 Hypertensive heart disease without heart failure: Secondary | ICD-10-CM | POA: Diagnosis not present

## 2015-09-09 DIAGNOSIS — Z720 Tobacco use: Secondary | ICD-10-CM | POA: Diagnosis not present

## 2015-09-09 DIAGNOSIS — M545 Low back pain: Secondary | ICD-10-CM | POA: Diagnosis not present

## 2015-09-09 DIAGNOSIS — E785 Hyperlipidemia, unspecified: Secondary | ICD-10-CM | POA: Diagnosis not present

## 2015-09-09 DIAGNOSIS — I10 Essential (primary) hypertension: Secondary | ICD-10-CM | POA: Diagnosis not present

## 2015-09-09 DIAGNOSIS — I119 Hypertensive heart disease without heart failure: Secondary | ICD-10-CM | POA: Diagnosis not present

## 2015-10-13 DIAGNOSIS — G894 Chronic pain syndrome: Secondary | ICD-10-CM | POA: Diagnosis not present

## 2015-10-13 DIAGNOSIS — M5416 Radiculopathy, lumbar region: Secondary | ICD-10-CM | POA: Diagnosis not present

## 2015-10-13 DIAGNOSIS — G8929 Other chronic pain: Secondary | ICD-10-CM | POA: Diagnosis not present

## 2015-10-13 DIAGNOSIS — M5442 Lumbago with sciatica, left side: Secondary | ICD-10-CM | POA: Diagnosis not present

## 2015-11-05 DIAGNOSIS — M4726 Other spondylosis with radiculopathy, lumbar region: Secondary | ICD-10-CM | POA: Diagnosis not present

## 2015-11-05 DIAGNOSIS — M5416 Radiculopathy, lumbar region: Secondary | ICD-10-CM | POA: Diagnosis not present

## 2015-11-05 DIAGNOSIS — M961 Postlaminectomy syndrome, not elsewhere classified: Secondary | ICD-10-CM | POA: Diagnosis not present

## 2015-11-17 DIAGNOSIS — M5442 Lumbago with sciatica, left side: Secondary | ICD-10-CM | POA: Diagnosis not present

## 2015-11-17 DIAGNOSIS — G8929 Other chronic pain: Secondary | ICD-10-CM | POA: Diagnosis not present

## 2015-11-17 DIAGNOSIS — G894 Chronic pain syndrome: Secondary | ICD-10-CM | POA: Diagnosis not present

## 2015-11-17 DIAGNOSIS — M5136 Other intervertebral disc degeneration, lumbar region: Secondary | ICD-10-CM | POA: Diagnosis not present

## 2015-12-31 DIAGNOSIS — R739 Hyperglycemia, unspecified: Secondary | ICD-10-CM | POA: Diagnosis not present

## 2015-12-31 DIAGNOSIS — M545 Low back pain: Secondary | ICD-10-CM | POA: Diagnosis not present

## 2015-12-31 DIAGNOSIS — E785 Hyperlipidemia, unspecified: Secondary | ICD-10-CM | POA: Diagnosis not present

## 2015-12-31 DIAGNOSIS — Z720 Tobacco use: Secondary | ICD-10-CM | POA: Diagnosis not present

## 2015-12-31 DIAGNOSIS — I119 Hypertensive heart disease without heart failure: Secondary | ICD-10-CM | POA: Diagnosis not present

## 2015-12-31 DIAGNOSIS — I10 Essential (primary) hypertension: Secondary | ICD-10-CM | POA: Diagnosis not present

## 2016-01-21 ENCOUNTER — Encounter: Payer: Self-pay | Admitting: Podiatry

## 2016-01-21 ENCOUNTER — Ambulatory Visit (INDEPENDENT_AMBULATORY_CARE_PROVIDER_SITE_OTHER): Payer: Medicare Other

## 2016-01-21 ENCOUNTER — Ambulatory Visit (INDEPENDENT_AMBULATORY_CARE_PROVIDER_SITE_OTHER): Payer: Medicare Other | Admitting: Podiatry

## 2016-01-21 VITALS — BP 123/75 | HR 84 | Resp 16 | Ht 70.0 in | Wt 186.0 lb

## 2016-01-21 DIAGNOSIS — Q828 Other specified congenital malformations of skin: Secondary | ICD-10-CM | POA: Diagnosis not present

## 2016-01-21 DIAGNOSIS — M2041 Other hammer toe(s) (acquired), right foot: Secondary | ICD-10-CM | POA: Diagnosis not present

## 2016-01-21 DIAGNOSIS — M79674 Pain in right toe(s): Secondary | ICD-10-CM

## 2016-01-21 NOTE — Progress Notes (Signed)
   Subjective:    Patient ID: Laura [name], female    DOB: 1945-10-19, 70 y.o.   MRN: [id number]  HPI Chief Complaint  Patient presents with  . Toe Pain    Right foot; 4th toe; pt stated, "Stepped on a cactus plant 5 months ago and got some of the needles out, but still feels like have some in toe"      Review of Systems  Musculoskeletal: Positive for back pain, gait problem and myalgias.  All other systems reviewed and are negative.      Objective:   Physical Exam        Assessment & Plan:

## 2016-01-21 NOTE — Progress Notes (Signed)
Subjective:     Patient ID: Laura [name], female   DOB: 1945/08/25, 70 y.o.   MRN: [id number]  HPI patient presents stating she stepped on a cactus around 6 months ago and she has had problems with her fourth toe since. States that it is very tender at the end and there is a lesion there and she has trouble walking on this   Review of Systems  All other systems reviewed and are negative.      Objective:   Physical Exam  Constitutional: She is oriented to person, place, and time.  Cardiovascular: Intact distal pulses.   Musculoskeletal: Normal range of motion.  Neurological: She is oriented to person, place, and time.  Skin: Skin is warm.  Nursing note and vitals reviewed.  neurovascular status intact muscle strength adequate range of motion within normal limits with patient found to have keratotic lesion on the distal lateral aspect of the fourth digit right that's painful when pressed and making walking difficult. There is also noted to be structural deformity of the toe itself and it is plantar flexed and she's walking directly on that area. Patient's found have good digital perfusion and is well oriented 3     Assessment:     Hammertoe deformity fourth digit right with distal keratotic lesion on the fourth toe that's painful when pressed    Plan:     H&P and condition reviewed and at this point we're going to treat conservatively. Debridement of lesion was accomplished and we went ahead and applied buttress pad to lift the toe. Reviewed x-rays and patient will be seen back to recheck again in 8 weeks and may require distal arthroplasty to take pressure off the toe depending on response. She will see Dr. Amalia Hailey at that time and decision can be made  X-ray report indicates plantar flexion fourth toe right

## 2016-02-29 DIAGNOSIS — G894 Chronic pain syndrome: Secondary | ICD-10-CM | POA: Diagnosis not present

## 2016-02-29 DIAGNOSIS — M5442 Lumbago with sciatica, left side: Secondary | ICD-10-CM | POA: Diagnosis not present

## 2016-02-29 DIAGNOSIS — Z79891 Long term (current) use of opiate analgesic: Secondary | ICD-10-CM | POA: Diagnosis not present

## 2016-02-29 DIAGNOSIS — M5136 Other intervertebral disc degeneration, lumbar region: Secondary | ICD-10-CM | POA: Diagnosis not present

## 2016-03-15 ENCOUNTER — Emergency Department (HOSPITAL_COMMUNITY)
Admission: EM | Admit: 2016-03-15 | Discharge: 2016-03-15 | Disposition: A | Discharge status: Home / Self Care | Payer: Medicare Other | Attending: Emergency Medicine | Admitting: Emergency Medicine

## 2016-03-15 ENCOUNTER — Encounter (HOSPITAL_COMMUNITY): Payer: Self-pay | Admitting: Emergency Medicine

## 2016-03-15 ENCOUNTER — Emergency Department (HOSPITAL_COMMUNITY): Payer: Medicare Other

## 2016-03-15 DIAGNOSIS — W010XXA Fall on same level from slipping, tripping and stumbling without subsequent striking against object, initial encounter: Secondary | ICD-10-CM | POA: Insufficient documentation

## 2016-03-15 DIAGNOSIS — Y9289 Other specified places as the place of occurrence of the external cause: Secondary | ICD-10-CM | POA: Diagnosis not present

## 2016-03-15 DIAGNOSIS — F17210 Nicotine dependence, cigarettes, uncomplicated: Secondary | ICD-10-CM | POA: Insufficient documentation

## 2016-03-15 DIAGNOSIS — N189 Chronic kidney disease, unspecified: Secondary | ICD-10-CM | POA: Diagnosis not present

## 2016-03-15 DIAGNOSIS — S80811A Abrasion, right lower leg, initial encounter: Secondary | ICD-10-CM | POA: Insufficient documentation

## 2016-03-15 DIAGNOSIS — I129 Hypertensive chronic kidney disease with stage 1 through stage 4 chronic kidney disease, or unspecified chronic kidney disease: Secondary | ICD-10-CM | POA: Insufficient documentation

## 2016-03-15 DIAGNOSIS — M7989 Other specified soft tissue disorders: Secondary | ICD-10-CM | POA: Diagnosis not present

## 2016-03-15 DIAGNOSIS — S6992XA Unspecified injury of left wrist, hand and finger(s), initial encounter: Secondary | ICD-10-CM | POA: Diagnosis not present

## 2016-03-15 DIAGNOSIS — Y999 Unspecified external cause status: Secondary | ICD-10-CM | POA: Diagnosis not present

## 2016-03-15 DIAGNOSIS — Y939 Activity, unspecified: Secondary | ICD-10-CM | POA: Diagnosis not present

## 2016-03-15 DIAGNOSIS — S8991XA Unspecified injury of right lower leg, initial encounter: Secondary | ICD-10-CM | POA: Diagnosis present

## 2016-03-15 DIAGNOSIS — M25531 Pain in right wrist: Secondary | ICD-10-CM | POA: Diagnosis not present

## 2016-03-15 DIAGNOSIS — M25532 Pain in left wrist: Secondary | ICD-10-CM | POA: Diagnosis not present

## 2016-03-15 DIAGNOSIS — S6991XA Unspecified injury of right wrist, hand and finger(s), initial encounter: Secondary | ICD-10-CM | POA: Diagnosis not present

## 2016-03-15 NOTE — ED Notes (Signed)
See np assessment

## 2016-03-15 NOTE — ED Notes (Signed)
Pt denies concerns with dc 

## 2016-03-15 NOTE — Discharge Instructions (Signed)
Call your orthopedic doctor in the morning to schedule follow up. Return here as needed.

## 2016-03-15 NOTE — Progress Notes (Signed)
Orthopedic Tech Progress Note Patient Details:  Laura [name] 1946/02/23 [id number]  Ortho Devices Type of Ortho Device: Ace wrap, Volar splint Ortho Device/Splint Location: LUE Ortho Device/Splint Interventions: Ordered, Application   Braulio Bosch 03/15/2016, 9:15 PM

## 2016-03-15 NOTE — ED Triage Notes (Signed)
Pt c/o trip and fall yesterday with residual left wrist pain.  Some swelling, pain with palpation, and limited movement noted.  Pt denies LOC or head injury.

## 2016-03-15 NOTE — ED Provider Notes (Signed)
[facility] DEPT Provider Note   CSN: [id number] Arrival date & time: 03/15/16  1843  By signing my name below, I, Laura [name], attest that this documentation has been prepared under the direction and in the presence of [name], NP. Electronically Signed: Estanislado [name], Scribe. 03/15/2016. 7:29 PM.   History   Chief Complaint Chief Complaint  Patient presents with  . Wrist Pain  . Fall    The history is provided by the patient. No language interpreter was used.   HPI Comments:  Laura [name] is a 70 y.o. female who presents to the Emergency Department s/p a fall yesterday in which she sustained L wrist pain and R foot pain. Pt reports that she was looking for her pet and lost her footing on a platform. Pt also reports abrasions to her R knee. Pt states that the pain to her R foot in minor. Pt has taken Vicodin and used ice with mild relief. Pt denies LOC, head injury,   Past Medical History:  Diagnosis Date  . Chronic kidney disease    ?renal cyst on MRI   . Lymphadenopathy    documented on chest CT scan 12/15/2005  . Substance abuse    tobacco    Patient Active Problem List   Diagnosis Date Noted  . Left hand pain 04/16/2014  . Chest pain 03/16/2014  . Stress incontinence in female 10/06/2013  . HTN (hypertension) 08/04/2013  . Health care maintenance 12/31/2012  . Post laminectomy syndrome 01/30/2012  . Lumbago 01/30/2012  . Back muscle spasm 05/31/2011  . GERD (gastroesophageal reflux disease) 01/05/2011  . HYPERLIPIDEMIA 05/18/2010  . DEPRESSION 11/25/2009  . Low back pain with radiation 04/16/2009  . TOBACCO ABUSE 03/12/2009  . PERIPHERAL NEUROPATHY, LOWER EXTREMITY, LEFT 03/12/2009  . ALLERGIC RHINITIS 10/09/2007  . CARPAL TUNNEL SYNDROME, LEFT 09/12/2007    Past Surgical History:  Procedure Laterality Date  . SPINE SURGERY     s/p C3-4, C4-5 diskectomy, fusion, plating and allograft, s/p posterior C3-5 fusion with wiring(02/11/2007 by Dr  Lorin Mercy) had psudoarthrosis lumbar surgery 2000)    OB History    No data available       Home Medications    Prior to Admission medications   Medication Sig Start Date End Date Taking? Authorizing Provider  amLODipine (NORVASC) 5 MG tablet Take 1 tablet (5 mg total) by mouth daily. 11/27/14   [name], MD  chlorpheniramine (CHLORPHEN) 4 MG tablet Take 1 tablet (4 mg total) by mouth every 6 (six) hours as needed for allergies. 07/14/14   [name], MD  gabapentin (NEURONTIN) 600 MG tablet TAKE 1 TABLET (600 MG TOTAL) BY MOUTH 3 (THREE) TIMES DAILY. 11/27/14   [name], MD  HYDROcodone-acetaminophen (NORCO) 10-325 MG tablet Take 1 tablet by mouth 2 (two) times daily. [facility]    Historical Provider, MD  loratadine (CLARITIN) 10 MG tablet Take 1 tablet (10 mg total) by mouth daily. 10/08/13   [name], MD  nicotine (NICODERM CQ - DOSED IN MG/24 HOURS) 14 mg/24hr patch Place 1 patch (14 mg total) onto the skin daily. 07/14/14   Charlesetta Shanks, MD  oxybutynin (DITROPAN) 5 MG tablet Take 1 tablet (5 mg total) by mouth 3 (three) times daily.    [name], MD  pantoprazole (PROTONIX) 40 MG tablet TAKE 1 TABLET BY MOUTH EVERY DAY *INSURANCE WILL PAY 04-06-2014 05/30/15   [name], MD  pravastatin (PRAVACHOL) 80 MG tablet Take 1  tablet (80 mg total) by mouth every evening. 08/19/14   [name], MD  triamcinolone (NASACORT) 55 MCG/ACT AERO nasal inhaler Place 2 sprays into the nose daily. 10/08/13   [name], MD    Family History Family History  Problem Relation Age of Onset  . Diabetes Father   . Hypertension Father     Social History Social History  Substance Use Topics  . Smoking status: Current Every Day Smoker    Packs/day: 0.10    Types: Cigarettes    Last attempt to quit: 10/29/2010  . Smokeless tobacco: Never Used     Comment: down to 1-2 per day  . Alcohol use 0.0 oz/week     Comment: social during holidays      Allergies   Review of patient's allergies indicates no known allergies.   Review of Systems Review of Systems  Musculoskeletal: Positive for arthralgias.       Left hand pain and swelling  Skin: Positive for wound (abrasion to lower leg).  Neurological: Negative for syncope.  All other systems reviewed and are negative.    Physical Exam Updated Vital Signs BP 132/83   Pulse 82   Temp 98.1 F (36.7 C) (Oral)   Resp 18   Ht 5\' 9"  (1.753 m)   Wt 83.5 kg   SpO2 100%   BMI 27.17 kg/m   Physical Exam  Constitutional: She appears well-developed and well-nourished. No distress.  HENT:  Head: Normocephalic and atraumatic.  Eyes: Conjunctivae are normal.  Cardiovascular: Normal rate.   Radial pulses 2+, adequate circulation.   Pulmonary/Chest: Effort normal.  Abdominal: She exhibits no distension.  Musculoskeletal:  Swelling and tenderness to dorsum of L hand and to L wrist. Pain with ROM of L wrist and with movement of finger.   Neurological: She is alert.  Skin: Skin is warm and dry.  Abrasions to R lower leg and R knee.   Psychiatric: She has a normal mood and affect.  Nursing note and vitals reviewed.    ED Treatments / Results  DIAGNOSTIC STUDIES:  Oxygen Saturation is 100% on RA, normal by my interpretation.    COORDINATION OF CARE:  7:30 PM Discussed treatment plan with pt at bedside and pt agreed to plan.   Labs (all labs ordered are listed, but only abnormal results are displayed) Labs Reviewed - No data to display   Radiology Dg Wrist Complete Left  Result Date: 03/15/2016 CLINICAL DATA:  Status post fall, with left wrist pain and swelling. Initial encounter. EXAM: LEFT WRIST - COMPLETE 3+ VIEW COMPARISON:  Left hand radiographs performed 04/16/2014 FINDINGS: There is no evidence of fracture. The scapholunate interval is widened, measuring just over 3 mm, suspicious for mild scapholunate dissociation. The carpal rows are otherwise grossly  intact. The joint spaces are otherwise preserved. Mild soft tissue swelling is noted about the wrist. IMPRESSION: 1. No evidence of fracture. 2. Widening of the scapholunate interval, new from 2015 and measuring just over 3 mm, suspicious for mild scapholunate dissociation. Electronically Signed   By: Garald Balding M.D.   On: 03/15/2016 20:02   Dg Wrist Complete Right  Result Date: 03/15/2016 CLINICAL DATA:  70 year old female with history of trauma from a fall yesterday complaining of left-sided wrist pain. Right-sided wrist films for comparison of the scaphoid. EXAM: RIGHT WRIST - COMPLETE 3+ VIEW COMPARISON:  None. FINDINGS: There is no evidence of fracture or dislocation. There is no evidence of arthropathy or other focal bone abnormality. Soft tissues  are unremarkable. Scapholunate interval is normal. IMPRESSION: Negative. Electronically Signed   By: Vinnie Langton M.D.   On: 03/15/2016 22:00   Dg Hand Complete Left  Result Date: 03/15/2016 CLINICAL DATA:  Status post fall, with injury to left wrist and hand. Initial encounter. EXAM: LEFT HAND - COMPLETE 3+ VIEW COMPARISON:  Left hand radiographs performed 04/16/2014 FINDINGS: There is no evidence of fracture or dislocation. The joint spaces are preserved. The carpal rows are intact, and demonstrate normal alignment Mild widening of the scapholunate interval is better assessed on concurrent wrist radiographs. Soft tissue swelling is noted about the wrist and dorsum of the hand. IMPRESSION: No evidence of fracture or dislocation. Electronically Signed   By: Garald Balding M.D.   On: 03/15/2016 20:05    Procedures Procedures (including critical care time)  Medications Ordered in ED Medications - No data to display   Initial Impression / Assessment and Plan / ED Course  I have reviewed the triage vital signs and the nursing notes.  Pertinent imaging results that were available during my care of the patient were reviewed by me and considered  in my medical decision making (see chart for details).  Clinical Course  Consult with Dr. Burney Gauze, on call for hand. Will x-ray opposite hand for comparison, apply splint to injured wrist and f/u in the office. Patient will continue her pain medication she has at home.   Final Clinical Impressions(s) / ED Diagnoses  70 y.o. female with left hand/wrist pain and swelling s/p fall stable for d/c without focal neuro deficits. Discussed with the patient and all questioned fully answered. She f/u with ortho or return here if any problems arise.  Final diagnoses:  Left wrist injury, initial encounter    New Prescriptions Discharge Medication List as of 03/15/2016 10:03 PM    I personally performed the services described in this documentation, which was scribed in my presence. The recorded information has been reviewed and is accurate.     [address] [place], NP 03/16/16 1829    Margette Fast, MD 03/17/16 [phone]

## 2016-03-17 DIAGNOSIS — S63522A Sprain of radiocarpal joint of left wrist, initial encounter: Secondary | ICD-10-CM | POA: Diagnosis not present

## 2016-03-22 ENCOUNTER — Ambulatory Visit: Payer: Medicare Other | Admitting: Podiatry

## 2016-03-23 ENCOUNTER — Ambulatory Visit: Payer: Medicare Other | Admitting: Podiatry

## 2016-04-03 DIAGNOSIS — M545 Low back pain: Secondary | ICD-10-CM | POA: Diagnosis not present

## 2016-04-03 DIAGNOSIS — I119 Hypertensive heart disease without heart failure: Secondary | ICD-10-CM | POA: Diagnosis not present

## 2016-04-03 DIAGNOSIS — Z720 Tobacco use: Secondary | ICD-10-CM | POA: Diagnosis not present

## 2016-04-03 DIAGNOSIS — I10 Essential (primary) hypertension: Secondary | ICD-10-CM | POA: Diagnosis not present

## 2016-04-03 DIAGNOSIS — E785 Hyperlipidemia, unspecified: Secondary | ICD-10-CM | POA: Diagnosis not present

## 2016-04-06 ENCOUNTER — Ambulatory Visit (INDEPENDENT_AMBULATORY_CARE_PROVIDER_SITE_OTHER): Payer: Medicare Other | Admitting: Podiatry

## 2016-04-06 DIAGNOSIS — M79674 Pain in right toe(s): Secondary | ICD-10-CM

## 2016-04-06 DIAGNOSIS — L923 Foreign body granuloma of the skin and subcutaneous tissue: Secondary | ICD-10-CM

## 2016-04-06 DIAGNOSIS — M2041 Other hammer toe(s) (acquired), right foot: Secondary | ICD-10-CM | POA: Diagnosis not present

## 2016-04-06 NOTE — Progress Notes (Signed)
Subjective:     Patient ID: DARCE GILDE, female   DOB: 01/30/1946, 70 y.o.   MRN: [id number]  HPI patient presents stating that the fourth toe is still really bothering her and she feels like something is in   Review of Systems     Objective:   Physical Exam Neurovascular status unchanged with patient noted to have distal lateral lesion formation digit for right that's difficult to say as to whether or not it's foreign body or related to foot structure    Assessment:     Pathology of the distal fourth digit right that's difficult to determine as to whether or not it may be lesion formation or foreign body    Plan:     Using sharp sterile instrumentation debridement of the area accomplished and I did notice there was small little foreign body spicules that I removed and flushed and applied sterile dressing. Still may be related to digital deformity and ultimately could require arthroplasty but at this time hopefully this will solve the problem and patient was given instructions on soaks

## 2016-04-07 DIAGNOSIS — S62347D Nondisplaced fracture of base of fifth metacarpal bone. left hand, subsequent encounter for fracture with routine healing: Secondary | ICD-10-CM | POA: Diagnosis not present

## 2016-04-28 DIAGNOSIS — S62347D Nondisplaced fracture of base of fifth metacarpal bone. left hand, subsequent encounter for fracture with routine healing: Secondary | ICD-10-CM | POA: Diagnosis not present

## 2016-05-01 DIAGNOSIS — M545 Low back pain: Secondary | ICD-10-CM | POA: Diagnosis not present

## 2016-05-01 DIAGNOSIS — I119 Hypertensive heart disease without heart failure: Secondary | ICD-10-CM | POA: Diagnosis not present

## 2016-05-01 DIAGNOSIS — Z720 Tobacco use: Secondary | ICD-10-CM | POA: Diagnosis not present

## 2016-05-01 DIAGNOSIS — I10 Essential (primary) hypertension: Secondary | ICD-10-CM | POA: Diagnosis not present

## 2016-05-01 DIAGNOSIS — E785 Hyperlipidemia, unspecified: Secondary | ICD-10-CM | POA: Diagnosis not present

## 2016-06-07 DIAGNOSIS — S62347D Nondisplaced fracture of base of fifth metacarpal bone. left hand, subsequent encounter for fracture with routine healing: Secondary | ICD-10-CM | POA: Diagnosis not present

## 2016-07-10 DIAGNOSIS — S62347D Nondisplaced fracture of base of fifth metacarpal bone. left hand, subsequent encounter for fracture with routine healing: Secondary | ICD-10-CM | POA: Diagnosis not present

## 2016-07-11 DIAGNOSIS — I119 Hypertensive heart disease without heart failure: Secondary | ICD-10-CM | POA: Diagnosis not present

## 2016-07-11 DIAGNOSIS — Z131 Encounter for screening for diabetes mellitus: Secondary | ICD-10-CM | POA: Diagnosis not present

## 2016-07-11 DIAGNOSIS — Z136 Encounter for screening for cardiovascular disorders: Secondary | ICD-10-CM | POA: Diagnosis not present

## 2016-07-11 DIAGNOSIS — I10 Essential (primary) hypertension: Secondary | ICD-10-CM | POA: Diagnosis not present

## 2016-07-11 DIAGNOSIS — Z0000 Encounter for general adult medical examination without abnormal findings: Secondary | ICD-10-CM | POA: Diagnosis not present

## 2016-08-15 DIAGNOSIS — R350 Frequency of micturition: Secondary | ICD-10-CM | POA: Diagnosis not present

## 2016-08-24 DIAGNOSIS — G894 Chronic pain syndrome: Secondary | ICD-10-CM | POA: Diagnosis not present

## 2016-08-24 DIAGNOSIS — G8929 Other chronic pain: Secondary | ICD-10-CM | POA: Diagnosis not present

## 2016-08-24 DIAGNOSIS — M5442 Lumbago with sciatica, left side: Secondary | ICD-10-CM | POA: Diagnosis not present

## 2016-11-02 DIAGNOSIS — E785 Hyperlipidemia, unspecified: Secondary | ICD-10-CM | POA: Diagnosis not present

## 2016-11-02 DIAGNOSIS — Z720 Tobacco use: Secondary | ICD-10-CM | POA: Diagnosis not present

## 2016-11-02 DIAGNOSIS — I119 Hypertensive heart disease without heart failure: Secondary | ICD-10-CM | POA: Diagnosis not present

## 2016-11-02 DIAGNOSIS — M79605 Pain in left leg: Secondary | ICD-10-CM | POA: Diagnosis not present

## 2016-11-02 DIAGNOSIS — I10 Essential (primary) hypertension: Secondary | ICD-10-CM | POA: Diagnosis not present

## 2016-11-21 DIAGNOSIS — H538 Other visual disturbances: Secondary | ICD-10-CM | POA: Diagnosis not present

## 2016-11-21 DIAGNOSIS — R079 Chest pain, unspecified: Secondary | ICD-10-CM | POA: Diagnosis not present

## 2016-11-21 DIAGNOSIS — Z131 Encounter for screening for diabetes mellitus: Secondary | ICD-10-CM | POA: Diagnosis not present

## 2016-11-21 DIAGNOSIS — Z0000 Encounter for general adult medical examination without abnormal findings: Secondary | ICD-10-CM | POA: Diagnosis not present

## 2016-11-21 DIAGNOSIS — Z01118 Encounter for examination of ears and hearing with other abnormal findings: Secondary | ICD-10-CM | POA: Diagnosis not present

## 2016-11-21 DIAGNOSIS — R05 Cough: Secondary | ICD-10-CM | POA: Diagnosis not present

## 2016-11-22 ENCOUNTER — Encounter (HOSPITAL_COMMUNITY): Payer: Self-pay | Admitting: Emergency Medicine

## 2016-11-22 ENCOUNTER — Emergency Department (HOSPITAL_COMMUNITY): Payer: Medicare Other

## 2016-11-22 ENCOUNTER — Observation Stay (HOSPITAL_COMMUNITY)
Admission: EM | Admit: 2016-11-22 | Discharge: 2016-11-24 | Disposition: A | Discharge status: Home / Self Care | Payer: Medicare Other | Attending: Family Medicine | Admitting: Family Medicine

## 2016-11-22 DIAGNOSIS — K219 Gastro-esophageal reflux disease without esophagitis: Secondary | ICD-10-CM | POA: Diagnosis not present

## 2016-11-22 DIAGNOSIS — Z833 Family history of diabetes mellitus: Secondary | ICD-10-CM | POA: Insufficient documentation

## 2016-11-22 DIAGNOSIS — E785 Hyperlipidemia, unspecified: Secondary | ICD-10-CM | POA: Diagnosis not present

## 2016-11-22 DIAGNOSIS — M5440 Lumbago with sciatica, unspecified side: Secondary | ICD-10-CM | POA: Diagnosis not present

## 2016-11-22 DIAGNOSIS — M961 Postlaminectomy syndrome, not elsewhere classified: Secondary | ICD-10-CM | POA: Insufficient documentation

## 2016-11-22 DIAGNOSIS — J410 Simple chronic bronchitis: Secondary | ICD-10-CM | POA: Insufficient documentation

## 2016-11-22 DIAGNOSIS — I129 Hypertensive chronic kidney disease with stage 1 through stage 4 chronic kidney disease, or unspecified chronic kidney disease: Secondary | ICD-10-CM | POA: Diagnosis not present

## 2016-11-22 DIAGNOSIS — F17200 Nicotine dependence, unspecified, uncomplicated: Secondary | ICD-10-CM | POA: Diagnosis not present

## 2016-11-22 DIAGNOSIS — G5602 Carpal tunnel syndrome, left upper limb: Secondary | ICD-10-CM | POA: Diagnosis not present

## 2016-11-22 DIAGNOSIS — R079 Chest pain, unspecified: Principal | ICD-10-CM | POA: Insufficient documentation

## 2016-11-22 DIAGNOSIS — D631 Anemia in chronic kidney disease: Secondary | ICD-10-CM | POA: Diagnosis not present

## 2016-11-22 DIAGNOSIS — E7800 Pure hypercholesterolemia, unspecified: Secondary | ICD-10-CM | POA: Diagnosis not present

## 2016-11-22 DIAGNOSIS — Z8249 Family history of ischemic heart disease and other diseases of the circulatory system: Secondary | ICD-10-CM | POA: Insufficient documentation

## 2016-11-22 DIAGNOSIS — G8929 Other chronic pain: Secondary | ICD-10-CM | POA: Insufficient documentation

## 2016-11-22 DIAGNOSIS — E1122 Type 2 diabetes mellitus with diabetic chronic kidney disease: Secondary | ICD-10-CM | POA: Insufficient documentation

## 2016-11-22 DIAGNOSIS — M545 Low back pain, unspecified: Secondary | ICD-10-CM | POA: Diagnosis present

## 2016-11-22 DIAGNOSIS — N189 Chronic kidney disease, unspecified: Secondary | ICD-10-CM | POA: Insufficient documentation

## 2016-11-22 DIAGNOSIS — E876 Hypokalemia: Secondary | ICD-10-CM | POA: Diagnosis not present

## 2016-11-22 DIAGNOSIS — Z720 Tobacco use: Secondary | ICD-10-CM | POA: Diagnosis present

## 2016-11-22 DIAGNOSIS — F17210 Nicotine dependence, cigarettes, uncomplicated: Secondary | ICD-10-CM | POA: Diagnosis not present

## 2016-11-22 DIAGNOSIS — I10 Essential (primary) hypertension: Secondary | ICD-10-CM | POA: Diagnosis present

## 2016-11-22 DIAGNOSIS — E1140 Type 2 diabetes mellitus with diabetic neuropathy, unspecified: Secondary | ICD-10-CM | POA: Insufficient documentation

## 2016-11-22 DIAGNOSIS — J309 Allergic rhinitis, unspecified: Secondary | ICD-10-CM | POA: Insufficient documentation

## 2016-11-22 DIAGNOSIS — Z7982 Long term (current) use of aspirin: Secondary | ICD-10-CM | POA: Diagnosis not present

## 2016-11-22 DIAGNOSIS — M1990 Unspecified osteoarthritis, unspecified site: Secondary | ICD-10-CM | POA: Diagnosis not present

## 2016-11-22 DIAGNOSIS — Z79899 Other long term (current) drug therapy: Secondary | ICD-10-CM | POA: Insufficient documentation

## 2016-11-22 DIAGNOSIS — R071 Chest pain on breathing: Secondary | ICD-10-CM | POA: Diagnosis not present

## 2016-11-22 DIAGNOSIS — F329 Major depressive disorder, single episode, unspecified: Secondary | ICD-10-CM | POA: Insufficient documentation

## 2016-11-22 DIAGNOSIS — N393 Stress incontinence (female) (male): Secondary | ICD-10-CM | POA: Insufficient documentation

## 2016-11-22 DIAGNOSIS — J40 Bronchitis, not specified as acute or chronic: Secondary | ICD-10-CM | POA: Diagnosis present

## 2016-11-22 HISTORY — DX: Pure hypercholesterolemia, unspecified: E78.00

## 2016-11-22 HISTORY — DX: Dorsalgia, unspecified: M54.9

## 2016-11-22 HISTORY — DX: Gastro-esophageal reflux disease without esophagitis: K21.9

## 2016-11-22 HISTORY — DX: Other chronic pain: G89.29

## 2016-11-22 LAB — CBC
HEMATOCRIT: 36.3 % (ref 36.0–46.0)
HEMOGLOBIN: 11.6 g/dL — AB (ref 12.0–15.0)
MCH: 30.4 pg (ref 26.0–34.0)
MCHC: 32 g/dL (ref 30.0–36.0)
MCV: 95.3 fL (ref 78.0–100.0)
Platelets: 260 10*3/uL (ref 150–400)
RBC: 3.81 MIL/uL — ABNORMAL LOW (ref 3.87–5.11)
RDW: 13.9 % (ref 11.5–15.5)
WBC: 9.1 10*3/uL (ref 4.0–10.5)

## 2016-11-22 LAB — BASIC METABOLIC PANEL
ANION GAP: 7 (ref 5–15)
BUN: 16 mg/dL (ref 6–20)
CO2: 23 mmol/L (ref 22–32)
Calcium: 8.7 mg/dL — ABNORMAL LOW (ref 8.9–10.3)
Chloride: 107 mmol/L (ref 101–111)
Creatinine, Ser: 0.94 mg/dL (ref 0.44–1.00)
GFR calc Af Amer: >60 mL/min (ref >60)
GFR calc non Af Amer: >60 mL/min (ref >60)
GLUCOSE: 133 mg/dL — AB (ref 65–99)
POTASSIUM: 3.2 mmol/L — AB (ref 3.5–5.1)
Sodium: 137 mmol/L (ref 135–145)

## 2016-11-22 LAB — I-STAT TROPONIN, ED: Troponin i, poc: 0 ng/mL (ref 0.00–0.08)

## 2016-11-22 LAB — TROPONIN I: Troponin I: <0.03 ng/mL (ref <0.03)

## 2016-11-22 MED ORDER — PRAVASTATIN SODIUM 40 MG PO TABS
80.0000 mg | ORAL_TABLET | Freq: Every evening | ORAL | Status: DC
  Administered 2016-11-22 – 2016-11-23 (×2): 80 mg via ORAL
  Filled 2016-11-22 (×2): qty 2

## 2016-11-22 MED ORDER — TRIAMCINOLONE ACETONIDE 55 MCG/ACT NA AERO: 2.0000 | INHALATION_SPRAY | Freq: Every day | NASAL | Status: DC | PRN

## 2016-11-22 MED ORDER — ONDANSETRON HCL 4 MG/2ML IJ SOLN: 4.0000 mg | Freq: Four times a day (QID) | INTRAMUSCULAR | Status: DC | PRN

## 2016-11-22 MED ORDER — PREDNISONE 20 MG PO TABS: 60.0000 mg | ORAL_TABLET | Freq: Every day | ORAL | Status: DC

## 2016-11-22 MED ORDER — LIFITEGRAST 5 % OP SOLN: 1.0000 [drp] | Freq: Every day | OPHTHALMIC | Status: DC

## 2016-11-22 MED ORDER — CYCLOBENZAPRINE HCL 10 MG PO TABS: 5.0000 mg | ORAL_TABLET | Freq: Once | ORAL | Status: DC

## 2016-11-22 MED ORDER — TIZANIDINE HCL 4 MG PO TABS
4.0000 mg | ORAL_TABLET | Freq: Three times a day (TID) | ORAL | Status: DC | PRN
  Administered 2016-11-23: 4 mg via ORAL
  Filled 2016-11-22 (×3): qty 1

## 2016-11-22 MED ORDER — PREDNISONE 20 MG PO TABS
60.0000 mg | ORAL_TABLET | Freq: Every day | ORAL | Status: DC
Start: 2016-11-22 — End: 2016-11-24
  Administered 2016-11-22: 60 mg via ORAL
  Filled 2016-11-22 (×3): qty 3

## 2016-11-22 MED ORDER — AMLODIPINE BESYLATE 5 MG PO TABS
5.0000 mg | ORAL_TABLET | Freq: Every day | ORAL | Status: DC
  Administered 2016-11-23 – 2016-11-24 (×2): 5 mg via ORAL
  Filled 2016-11-22 (×2): qty 1

## 2016-11-22 MED ORDER — ASPIRIN 81 MG PO CHEW
81.0000 mg | CHEWABLE_TABLET | Freq: Every day | ORAL | Status: DC
  Administered 2016-11-23 – 2016-11-24 (×2): 81 mg via ORAL
  Filled 2016-11-22 (×2): qty 1

## 2016-11-22 MED ORDER — MORPHINE SULFATE (PF) 4 MG/ML IV SOLN: 2.0000 mg | INTRAVENOUS | Status: DC | PRN

## 2016-11-22 MED ORDER — GUAIFENESIN ER 600 MG PO TB12
600.0000 mg | ORAL_TABLET | Freq: Two times a day (BID) | ORAL | Status: DC
  Administered 2016-11-22 – 2016-11-24 (×4): 600 mg via ORAL
  Filled 2016-11-22 (×4): qty 1

## 2016-11-22 MED ORDER — IPRATROPIUM-ALBUTEROL 0.5-2.5 (3) MG/3ML IN SOLN
3.0000 mL | Freq: Once | RESPIRATORY_TRACT | Status: AC
  Administered 2016-11-22: 3 mL via RESPIRATORY_TRACT
  Filled 2016-11-22: qty 3

## 2016-11-22 MED ORDER — NITROGLYCERIN 0.4 MG SL SUBL: 0.4000 mg | SUBLINGUAL_TABLET | SUBLINGUAL | Status: DC | PRN

## 2016-11-22 MED ORDER — IPRATROPIUM-ALBUTEROL 0.5-2.5 (3) MG/3ML IN SOLN: 3.0000 mL | RESPIRATORY_TRACT | Status: AC

## 2016-11-22 MED ORDER — TIZANIDINE HCL 4 MG PO TABS
4.0000 mg | ORAL_TABLET | Freq: Three times a day (TID) | ORAL | Status: DC | PRN
  Filled 2016-11-22: qty 1

## 2016-11-22 MED ORDER — PANTOPRAZOLE SODIUM 40 MG PO TBEC
40.0000 mg | DELAYED_RELEASE_TABLET | Freq: Every day | ORAL | Status: DC
  Administered 2016-11-24: 40 mg via ORAL
  Filled 2016-11-22 (×2): qty 1

## 2016-11-22 MED ORDER — IPRATROPIUM-ALBUTEROL 0.5-2.5 (3) MG/3ML IN SOLN: 3.0000 mL | RESPIRATORY_TRACT | Status: DC | PRN

## 2016-11-22 MED ORDER — POTASSIUM CHLORIDE CRYS ER 20 MEQ PO TBCR
40.0000 meq | EXTENDED_RELEASE_TABLET | Freq: Once | ORAL | Status: AC
  Administered 2016-11-22: 40 meq via ORAL
  Filled 2016-11-22: qty 2

## 2016-11-22 MED ORDER — ACETAMINOPHEN 325 MG PO TABS: 650.0000 mg | ORAL_TABLET | ORAL | Status: DC | PRN

## 2016-11-22 MED ORDER — LORATADINE 10 MG PO TABS
10.0000 mg | ORAL_TABLET | Freq: Every day | ORAL | Status: DC
  Filled 2016-11-22 (×2): qty 1

## 2016-11-22 MED ORDER — GABAPENTIN 600 MG PO TABS
600.0000 mg | ORAL_TABLET | Freq: Three times a day (TID) | ORAL | Status: DC
  Administered 2016-11-22 – 2016-11-23 (×2): 600 mg via ORAL
  Filled 2016-11-22 (×2): qty 1

## 2016-11-22 MED ORDER — SPIRONOLACTONE 25 MG PO TABS
25.0000 mg | ORAL_TABLET | Freq: Every day | ORAL | Status: DC
  Administered 2016-11-23 – 2016-11-24 (×2): 25 mg via ORAL
  Filled 2016-11-22 (×2): qty 1

## 2016-11-22 MED ORDER — GI COCKTAIL ~~LOC~~: 30.0000 mL | Freq: Four times a day (QID) | ORAL | Status: DC | PRN

## 2016-11-22 MED ORDER — HYDROCODONE-ACETAMINOPHEN 10-325 MG PO TABS
1.0000 | ORAL_TABLET | Freq: Two times a day (BID) | ORAL | Status: DC | PRN
  Administered 2016-11-22 – 2016-11-24 (×4): 1 via ORAL
  Filled 2016-11-22 (×4): qty 1

## 2016-11-22 MED ORDER — ENOXAPARIN SODIUM 40 MG/0.4ML ~~LOC~~ SOLN
40.0000 mg | SUBCUTANEOUS | Status: DC
  Administered 2016-11-22: 40 mg via SUBCUTANEOUS
  Filled 2016-11-22: qty 0.4

## 2016-11-22 NOTE — Progress Notes (Signed)
Pt having c/o muscle spasm pain in right side and states she usually take zanaflex PRN. MD notified. Will continue to monitor. Isac Caddy, RN

## 2016-11-22 NOTE — ED Provider Notes (Signed)
[facility] DEPT Provider Note   CSN: [id number] Arrival date & time: 11/22/16  1807     History   Chief Complaint Chief Complaint  Patient presents with  . Chest Pain  . Shortness of Breath    HPI Laura [name] is a 71 y.o. female.  HPI  Sharp pain. Soreness like an ache, down arm and to fingers.   Has been going on for a few weeks, had an appointment to see doctor, called him and was seen yesterday.  Saw him yesterday and was given rx for cough syrup but didn't pick it up because didn't want it.  Today was more sharp pain, felt a weakness all over. Just feeling severe fatigue.  Left sided chest pain and pain to arm. Reports currently moving but family would not let her move anything because of hx of back pain.   Resting makes pain better, relaxing. Worse with exertion. Feels associated shortness of breath. No nausea/vomiting.  Sweating with episodes of chest pain. Severe diaphoresis today. Reports nitroglycerin helped with chest pain.  Now not having any pain.  Reports others can tell she is feeling the pain, but if she stops she will feel better.    Past Medical History:  Diagnosis Date  . Chronic back pain    "mostly lower; left foot is numb all the time" (11/22/2016)  . Chronic kidney disease    ?renal cyst on MRI   . GERD (gastroesophageal reflux disease)   . High cholesterol   . Lymphadenopathy    documented on chest CT scan 12/15/2005  . Substance abuse    tobacco    Patient Active Problem List   Diagnosis Date Noted  . Bronchitis due to tobacco use ([place]) 11/22/2016  . Benign essential HTN 11/22/2016  . Left hand pain 04/16/2014  . Chest pain 03/16/2014  . Stress incontinence in female 10/06/2013  . HTN (hypertension) 08/04/2013  . Health care maintenance 12/31/2012  . Post laminectomy syndrome 01/30/2012  . Lumbago 01/30/2012  . Back muscle spasm 05/31/2011  . GERD (gastroesophageal reflux disease) 01/05/2011  . HLD (hyperlipidemia) 05/18/2010  .  DEPRESSION 11/25/2009  . Low back pain with radiation 04/16/2009  . TOBACCO ABUSE 03/12/2009  . PERIPHERAL NEUROPATHY, LOWER EXTREMITY, LEFT 03/12/2009  . ALLERGIC RHINITIS 10/09/2007  . CARPAL TUNNEL SYNDROME, LEFT 09/12/2007    Past Surgical History:  Procedure Laterality Date  . ANTERIOR CERVICAL DISCECTOMY     C3-4, C4-5 diskectomy, fusion, plating and allograft,   . BACK SURGERY    . CARPAL TUNNEL WITH CUBITAL TUNNEL Left   . LUMBAR DISC SURGERY  X 2-3  . POSTERIOR CERVICAL FUSION/FORAMINOTOMY     C3-5 fusion with wirin  . POSTERIOR LUMBAR FUSION    . SPINE SURGERY     s/p C3-4, C4-5 diskectomy, fusion, plating and allograft, s/p posterior C3-5 fusion with wiring(02/11/2007 by Dr Lorin Mercy) had psudoarthrosis lumbar surgery 2000)    OB History    No data available       Home Medications    Prior to Admission medications   Medication Sig Start Date End Date Taking? Authorizing Provider  amLODipine (NORVASC) 5 MG tablet Take 1 tablet (5 mg total) by mouth daily. 11/27/14  Yes [name], MD  aspirin 81 MG chewable tablet Chew 81 mg by mouth daily.   Yes [provider]  gabapentin (NEURONTIN) 600 MG tablet TAKE 1 TABLET (600 MG TOTAL) BY MOUTH 3 (THREE) TIMES DAILY. 11/27/14  Yes [name], MD  HYDROcodone-acetaminophen (NORCO) 10-325 MG tablet Take 1 tablet by mouth 2 (two) times daily. [facility]   Yes [provider]  Lifitegrast Shirley Friar) 5 % SOLN Apply 1 drop to eye daily. One drop in both eyes   Yes [provider]  pantoprazole (PROTONIX) 40 MG tablet TAKE 1 TABLET BY MOUTH EVERY DAY *INSURANCE WILL PAY 04-06-2014 Patient taking differently: TAKE 1 TABLET BY MOUTH EVERY DAY /Take as needed for stomach discomfort 05/30/15  Yes Rice, Resa Miner, MD  pravastatin (PRAVACHOL) 80 MG tablet Take 1 tablet (80 mg total) by mouth every evening. 08/19/14  Yes [name], MD  spironolactone (ALDACTONE) 25 MG tablet Take 25 mg  by mouth daily. 09/01/16  Yes [provider]  tiZANidine (ZANAFLEX) 4 MG tablet Take 4 mg by mouth every 8 (eight) hours as needed for muscle spasms.   Yes [provider]  nicotine (NICODERM CQ - DOSED IN MG/24 HOURS) 14 mg/24hr patch Place 1 patch (14 mg total) onto the skin daily. Patient not taking: Reported on 11/22/2016 07/14/14   Moding, Langley Gauss, MD    Family History Family History  Problem Relation Age of Onset  . Diabetes Father   . Hypertension Father     Social History Social History  Substance Use Topics  . Smoking status: Current Every Day Smoker    Packs/day: 0.12    Years: 47.00    Types: Cigarettes  . Smokeless tobacco: Never Used  . Alcohol use 0.0 oz/week     Comment: 11/22/2016 "2 drinks q other holiday"     Allergies   Patient has no known allergies.   Review of Systems Review of Systems  Constitutional: Positive for diaphoresis. Negative for fever.  HENT: Negative for sore throat.   Eyes: Negative for visual disturbance.  Respiratory: Positive for cough (2 weeks) and shortness of breath.   Cardiovascular: Positive for chest pain.  Gastrointestinal: Negative for abdominal pain, nausea and vomiting.  Genitourinary: Negative for difficulty urinating and dysuria.  Musculoskeletal: Negative for back pain and neck pain.  Skin: Negative for rash.  Neurological: Negative for syncope and headaches.     Physical Exam Updated Vital Signs BP 125/61 (BP Location: Left Arm)   Pulse 93   Temp 97.9 F (36.6 C) (Oral)   Resp 18   Ht 5\' 7"  (1.702 m)   Wt 90.8 kg (200 lb 3.2 oz)   SpO2 97%   BMI 31.36 kg/m   Physical Exam  Constitutional: She is oriented to person, place, and time. She appears well-developed and well-nourished. No distress.  HENT:  Head: Normocephalic and atraumatic.  Eyes: Conjunctivae and EOM are normal.  Neck: Normal range of motion.  Cardiovascular: Normal rate, regular rhythm, normal heart sounds and intact distal  pulses.  Exam reveals no gallop and no friction rub.   No murmur heard. Pulmonary/Chest: Effort normal and breath sounds normal. No respiratory distress. She has no wheezes. She has no rales.  Abdominal: Soft. She exhibits no distension. There is no tenderness. There is no guarding.  Musculoskeletal: She exhibits no edema or tenderness.  Neurological: She is alert and oriented to person, place, and time.  Skin: Skin is warm and dry. No rash noted. She is not diaphoretic. No erythema.  Nursing note and vitals reviewed.    ED Treatments / Results  Labs (all labs ordered are listed, but only abnormal results are displayed) Labs Reviewed  BASIC METABOLIC PANEL - Abnormal; Notable for the following:  Result Value   Potassium 3.2 (*)    Glucose, Bld 133 (*)    Calcium 8.7 (*)    All other components within normal limits  CBC - Abnormal; Notable for the following:    RBC 3.81 (*)    Hemoglobin 11.6 (*)    All other components within normal limits  TROPONIN I  TROPONIN I  CBC WITH DIFFERENTIAL/PLATELET  BASIC METABOLIC PANEL  MAGNESIUM  TROPONIN I  LIPID PANEL  I-STAT TROPOININ, ED    EKG  EKG Interpretation None       Radiology Dg Chest 2 View  Result Date: 11/22/2016 CLINICAL DATA:  Left-sided chest pain for 2 weeks EXAM: CHEST  2 VIEW COMPARISON:  08/04/2013, 05/19/2008 FINDINGS: Hyperinflation. No focal infiltrate or effusion. Borderline cardiomegaly. No edema. Mildly tortuous aorta dog. No pneumothorax. Mild degenerative changes. IMPRESSION: Borderline cardiomegaly.  No acute infiltrate or edema. Electronically Signed   By: Donavan Foil M.D.   On: 11/22/2016 19:12    Procedures Procedures (including critical care time)  Medications Ordered in ED Medications  loratadine (CLARITIN) tablet 10 mg (not administered)  acetaminophen (TYLENOL) tablet 650 mg (not administered)  ondansetron (ZOFRAN) injection 4 mg (not administered)  enoxaparin (LOVENOX) injection 40  mg (40 mg Subcutaneous Given 11/22/16 2317)  morphine 4 MG/ML injection 2 mg (not administered)  gi cocktail (Maalox,Lidocaine,Donnatal) (not administered)  nitroGLYCERIN (NITROSTAT) SL tablet 0.4 mg (not administered)  ipratropium-albuterol (DUONEB) 0.5-2.5 (3) MG/3ML nebulizer solution 3 mL (not administered)  ipratropium-albuterol (DUONEB) 0.5-2.5 (3) MG/3ML nebulizer solution 3 mL (3 mLs Nebulization Not Given 11/22/16 2325)  predniSONE (DELTASONE) tablet 60 mg (60 mg Oral Given 11/22/16 2316)  guaiFENesin (MUCINEX) 12 hr tablet 600 mg (600 mg Oral Given 11/22/16 2316)  aspirin chewable tablet 81 mg (not administered)  pantoprazole (PROTONIX) EC tablet 40 mg (not administered)  HYDROcodone-acetaminophen (NORCO) 10-325 MG per tablet 1 tablet (1 tablet Oral Given 11/22/16 2322)  Lifitegrast 5 % SOLN 1 drop (not administered)  spironolactone (ALDACTONE) tablet 25 mg (not administered)  amLODipine (NORVASC) tablet 5 mg (not administered)  gabapentin (NEURONTIN) tablet 600 mg (600 mg Oral Given 11/22/16 2316)  pravastatin (PRAVACHOL) tablet 80 mg (80 mg Oral Given 11/22/16 2322)  tiZANidine (ZANAFLEX) tablet 4 mg (not administered)  potassium chloride SA (K-DUR,KLOR-CON) CR tablet 40 mEq (40 mEq Oral Given 11/22/16 2016)  ipratropium-albuterol (DUONEB) 0.5-2.5 (3) MG/3ML nebulizer solution 3 mL (3 mLs Nebulization Given 11/22/16 2027)     Initial Impression / Assessment and Plan / ED Course  I have reviewed the triage vital signs and the nursing notes.  Pertinent labs & imaging results that were available during my care of the patient were reviewed by me and considered in my medical decision making (see chart for details).     71 year old female with a history of hypertension, hyperlipidemia, smoking who presents with concern for chest pain. EKG without significant findings. Initial troponin is negative. Have low suspicion for pulmonary embolus given patient not tachycardic, not tachypnea, not hypoxic, no  asymmetric leg swelling, no other risk factors. Doubt aortic dissection. Chest x-ray shows no sign pneumonia or congestive heart failure. Patient describes exertional chest pain radiating to the arm and diaphoresis which improved with nitroglycerin. Given her description of pain, risk factors I've calculated her heart score is a 6. We'll admit patient for continued monitoring and chest pain evaluation. She received ASA prior to arrival.   Final Clinical Impressions(s) / ED Diagnoses   Final diagnoses:  Chest pain, unspecified  type    New Prescriptions Current Discharge Medication List       Gareth Morgan, MD 11/23/16 [phone]

## 2016-11-22 NOTE — ED Triage Notes (Signed)
Per EMS:  Pt presents to ED for assessment of intermittent CP and SOB x 2 weeks.  Pt was seen by her PCP for same with a referral to cardiology, but today pt experienced additional pressure and pain to the left arm.  Pt given 324 ASA en route and 2 nitro with pain going from 5/10 to 2/10.  Pt CBG 123, vitals WDL, EKG NSR.

## 2016-11-22 NOTE — H&P (Signed)
History and Physical    Laura [name] HDQ:[id number] DOB: 07/14/1945 DOA: 11/22/2016  Referring MD/NP/PA: Dr. Billy Fischer PCP: Benito Mccreedy, MD  Patient coming from: Home vs. EMS  Chief Complaint: Chest pain  HPI: Laura [name] is a 71 y.o. female with medical history significant of HTN, HLD, and tobacco abuse; who presents with complaints of chest pain. Patient reports intermittent chest pain over the last 1 month that has acutely worsened in the last 2-3 days. Patient attributed symptoms to stress with moving to a new home. Reports centralized chest soreness that she states is intermittently sharp. Symptoms can happen at rest and lasts a few minutes prior to self resolving. Over the last few days she has noted radiation into her left arm with numbness and tingling sensation in her fingers. Other associated symptoms include diaphoresis, intermittent sore rolling of the lower extremities, cough, shortness of breath with exertion, muscle cramps, and fatigue. Patient denies any nausea, vomiting, change in weight, palpitations, loss of consciousness, recent travel, calf pain, or focal weakness. She has been trying to quit smoking tobacco in his utilizing nicotine gum. Currently only smoking 4-5 cigarettes per day usually on at rest. Patient reports that she was seen by her PCP yesterday who did a previous study at that time and stated that the dose within normal limits. Patient has not had any previous cardiac workup or studies.  ED Course: En route with EMS patient was given 324 mg of aspirin as well as 2 nitroglycerin with pain going from 5/10 down to 2/10. Initial EKGs showing normal sinus rhythm. Labs reveal hemoglobin 11.6, potassium 3.2, and initial troponin 0. 2. TR[facility] called to admit for chest pain rule out.  Review of Systems: As per HPI otherwise 10 point review of systems negative.   Past Medical History:  Diagnosis Date  . Chronic kidney disease    ?renal cyst on MRI   .  Lymphadenopathy    documented on chest CT scan 12/15/2005  . Substance abuse    tobacco    Past Surgical History:  Procedure Laterality Date  . SPINE SURGERY     s/p C3-4, C4-5 diskectomy, fusion, plating and allograft, s/p posterior C3-5 fusion with wiring(02/11/2007 by Dr Lorin Mercy) had psudoarthrosis lumbar surgery 2000)     reports that she has been smoking Cigarettes.  She has been smoking about 0.10 packs per day. She has never used smokeless tobacco. She reports that she drinks alcohol. She reports that she does not use drugs.  No Known Allergies  Family History  Problem Relation Age of Onset  . Diabetes Father   . Hypertension Father     Prior to Admission medications   Medication Sig Start Date End Date Taking? Authorizing Provider  amLODipine (NORVASC) 5 MG tablet Take 1 tablet (5 mg total) by mouth daily. 11/27/14   [name], MD  chlorpheniramine (CHLORPHEN) 4 MG tablet Take 1 tablet (4 mg total) by mouth every 6 (six) hours as needed for allergies. 07/14/14   Moding, Langley Gauss, MD  gabapentin (NEURONTIN) 600 MG tablet TAKE 1 TABLET (600 MG TOTAL) BY MOUTH 3 (THREE) TIMES DAILY. 11/27/14   [name], MD  HYDROcodone-acetaminophen (NORCO) 10-325 MG tablet Take 1 tablet by mouth 2 (two) times daily. [facility]    [provider]  loratadine (CLARITIN) 10 MG tablet Take 1 tablet (10 mg total) by mouth daily. 10/08/13   [name], MD  nicotine (NICODERM CQ - DOSED IN MG/24 HOURS)  14 mg/24hr patch Place 1 patch (14 mg total) onto the skin daily. 07/14/14   Moding, Langley Gauss, MD  oxybutynin (DITROPAN) 5 MG tablet Take 1 tablet (5 mg total) by mouth 3 (three) times daily.    [name], MD  pantoprazole (PROTONIX) 40 MG tablet TAKE 1 TABLET BY MOUTH EVERY DAY *INSURANCE WILL PAY 04-06-2014 05/30/15   [name], MD  pravastatin (PRAVACHOL) 80 MG tablet Take 1 tablet (80 mg total) by mouth every evening. 08/19/14   [name], MD    triamcinolone (NASACORT) 55 MCG/ACT AERO nasal inhaler Place 2 sprays into the nose daily. 10/08/13   [name], MD    Physical Exam: Constitutional: Elderly female who appears to be in mild distress. Vitals:   11/22/16 1930 11/22/16 2000 11/22/16 2015 11/22/16 2031  BP: 130/64 126/90  (!) 141/87  Pulse: 74 83  71  Resp:   16 16  Temp:      TempSrc:      SpO2: 100% 98%  100%   Eyes: PERRL, lids and conjunctivae normal ENMT: Mucous membranes are moist. Posterior pharynx clear of any exudate or lesions. Neck: normal, supple, no masses, no thyromegaly Respiratory: Diffuse expiratory wheezes appreciated throughout both lung fields. No significant crackles or rhonchi appreciated. Cardiovascular: Regular rate and rhythm, no murmurs / rubs / gallops. No extremity edema. 2+ pedal pulses. No carotid bruits. Patient reports chest soreness with palpation of the chest wall. Abdomen: no tenderness, no masses palpated. No hepatosplenomegaly. Bowel sounds positive.  Musculoskeletal: no clubbing / cyanosis. No joint deformity upper and lower extremities. Good ROM, no contractures. Normal muscle tone.  Skin: no rashes, lesions, ulcers. No induration Neurologic: CN 2-12 grossly intact. Sensation intact, DTR normal. Strength 5/5 in all 4.  Psychiatric: Normal judgment and insight. Alert and oriented x 3. Anxiousl mood.     Labs on Admission: I have personally reviewed following labs and imaging studies  CBC:  Recent Labs Lab 11/22/16 1832  WBC 9.1  HGB 11.6*  HCT 36.3  MCV 95.3  PLT 751   Basic Metabolic Panel:  Recent Labs Lab 11/22/16 1832  NA 137  K 3.2*  CL 107  CO2 23  GLUCOSE 133*  BUN 16  CREATININE 0.94  CALCIUM 8.7*   GFR: CrCl cannot be calculated (Unknown ideal weight.). Liver Function Tests: No results for input(s): AST, ALT, ALKPHOS, BILITOT, PROT, ALBUMIN in the last 168 hours. No results for input(s): LIPASE, AMYLASE in the last 168 hours. No results for  input(s): AMMONIA in the last 168 hours. Coagulation Profile: No results for input(s): INR, PROTIME in the last 168 hours. Cardiac Enzymes: No results for input(s): CKTOTAL, CKMB, CKMBINDEX, TROPONINI in the last 168 hours. BNP (last 3 results) No results for input(s): PROBNP in the last 8760 hours. HbA1C: No results for input(s): HGBA1C in the last 72 hours. CBG: No results for input(s): GLUCAP in the last 168 hours. Lipid Profile: No results for input(s): CHOL, HDL, LDLCALC, TRIG, CHOLHDL, LDLDIRECT in the last 72 hours. Thyroid Function Tests: No results for input(s): TSH, T4TOTAL, FREET4, T3FREE, THYROIDAB in the last 72 hours. Anemia Panel: No results for input(s): VITAMINB12, FOLATE, FERRITIN, TIBC, IRON, RETICCTPCT in the last 72 hours. Urine analysis:    Component Value Date/Time   COLORURINE YELLOW 05/19/2008 0823   APPEARANCEUR CLEAR 05/19/2008 0823   LABSPEC 1.025 05/19/2008 0823   PHURINE 6.0 05/19/2008 [place] 05/19/2008 0823   HGBUR NEGATIVE 05/19/2008 0258  BILIRUBINUR NEGATIVE 05/19/2008 0823   KETONESUR NEGATIVE 05/19/2008 0823   PROTEINUR NEGATIVE 05/19/2008 0823   UROBILINOGEN 1.0 05/19/2008 0823   NITRITE NEGATIVE 05/19/2008 0823   LEUKOCYTESUR  05/19/2008 0823    NEGATIVE MICROSCOPIC NOT DONE ON URINES WITH NEGATIVE PROTEIN, BLOOD, LEUKOCYTES, NITRITE, OR GLUCOSE <1000 mg/dL.   Sepsis Labs: No results found for this or any previous visit (from the past 240 hour(s)).   Radiological Exams on Admission: Dg Chest 2 View  Result Date: 11/22/2016 CLINICAL DATA:  Left-sided chest pain for 2 weeks EXAM: CHEST  2 VIEW COMPARISON:  08/04/2013, 05/19/2008 FINDINGS: Hyperinflation. No focal infiltrate or effusion. Borderline cardiomegaly. No edema. Mildly tortuous aorta dog. No pneumothorax. Mild degenerative changes. IMPRESSION: Borderline cardiomegaly.  No acute infiltrate or edema. Electronically Signed   By: Donavan Foil M.D.   On: 11/22/2016  19:12    EKG: Independently reviewed. Normal sinus rhythm  Assessment/Plan Chest Pain: Patient presents with concerning complaints of chest pain and pressure. Initial troponin negative and EKG showing normal sinus rhythm. Heart score = 6. Chest x-ray shows signs of borderline cardiomegaly. - Admit to telemetry bed - Check serial troponins and lipid panel - Nitroglycerin, morphine, GI cocktail prn - Check echocardiogram in a.m. - ASA  - Consult to cardiology in a.m. message sent to [facility]   Dyspnea/suspected bronchitis: Acute. Patient complains of worsening shortness of breath. On physical exam found to be diffusely wheezing throughout both lung fields. X-ray shows borderline cardiomegaly without any acute infiltrate. Patient recently reports having previous study performed by PCP that was told to the patient to be normal. - prednisone - DuoNeb's prn SOB/wheezing - Mucinex Hypokalemia: Acute. Initial potassium 3.2 on admission - Continue with 40 mEq of potassium chloride 1 dose now - Continue to monitor and replace as needed  Essential Hypertension - Continue amlodipine and spironolactone   Normocytic normochromic anemia: Hemoglobin 11.6 and patient any reports of bleeding. - Recheck CBC in a.m.  Chronic back pain - Continue gabapentin and hydrocodone prn moderate pain  Hyperlipidemia - Continue pravastatin   Tobacco abuse - Nicotine patch - Counseled on the need for cessation of tobacco  GERD - Continue Protonix   DVT prophylaxis: Lovenox Code Status: Full Family Communication: No family present at bedside Disposition Plan: Possible discharge home tomorrow if cardiac workup negative  Consults called: None Admission status: observation  Norval Morton MD [facility] Pager [phone]  If 7PM-7AM, please contact night-coverage [email or web address] Password [facility]  11/22/2016, 8:38 PM

## 2016-11-22 NOTE — ED Notes (Signed)
Sandwich and sprite given. 

## 2016-11-22 NOTE — ED Notes (Signed)
Patient transported to x-ray via stretcher and on monitor.

## 2016-11-23 ENCOUNTER — Observation Stay (HOSPITAL_BASED_OUTPATIENT_CLINIC_OR_DEPARTMENT_OTHER): Payer: Medicare Other

## 2016-11-23 DIAGNOSIS — I5030 Unspecified diastolic (congestive) heart failure: Secondary | ICD-10-CM | POA: Diagnosis not present

## 2016-11-23 DIAGNOSIS — R079 Chest pain, unspecified: Secondary | ICD-10-CM | POA: Diagnosis not present

## 2016-11-23 DIAGNOSIS — I200 Unstable angina: Secondary | ICD-10-CM

## 2016-11-23 DIAGNOSIS — I129 Hypertensive chronic kidney disease with stage 1 through stage 4 chronic kidney disease, or unspecified chronic kidney disease: Secondary | ICD-10-CM | POA: Diagnosis not present

## 2016-11-23 DIAGNOSIS — F17200 Nicotine dependence, unspecified, uncomplicated: Secondary | ICD-10-CM | POA: Diagnosis not present

## 2016-11-23 DIAGNOSIS — D649 Anemia, unspecified: Secondary | ICD-10-CM | POA: Diagnosis not present

## 2016-11-23 DIAGNOSIS — R7309 Other abnormal glucose: Secondary | ICD-10-CM | POA: Diagnosis not present

## 2016-11-23 DIAGNOSIS — I10 Essential (primary) hypertension: Secondary | ICD-10-CM | POA: Diagnosis not present

## 2016-11-23 DIAGNOSIS — N189 Chronic kidney disease, unspecified: Secondary | ICD-10-CM | POA: Diagnosis not present

## 2016-11-23 DIAGNOSIS — E7800 Pure hypercholesterolemia, unspecified: Secondary | ICD-10-CM | POA: Diagnosis not present

## 2016-11-23 DIAGNOSIS — Z720 Tobacco use: Secondary | ICD-10-CM | POA: Diagnosis not present

## 2016-11-23 DIAGNOSIS — E1122 Type 2 diabetes mellitus with diabetic chronic kidney disease: Secondary | ICD-10-CM | POA: Diagnosis not present

## 2016-11-23 DIAGNOSIS — J410 Simple chronic bronchitis: Secondary | ICD-10-CM | POA: Diagnosis not present

## 2016-11-23 DIAGNOSIS — E785 Hyperlipidemia, unspecified: Secondary | ICD-10-CM | POA: Diagnosis not present

## 2016-11-23 LAB — TROPONIN I

## 2016-11-23 LAB — LIPID PANEL
CHOLESTEROL: 123 mg/dL (ref 0–200)
HDL: 30 mg/dL — ABNORMAL LOW (ref >40)
LDL Cholesterol: 88 mg/dL (ref 0–99)
Total CHOL/HDL Ratio: 4.1 RATIO
Triglycerides: 27 mg/dL (ref <150)
VLDL: 5 mg/dL (ref 0–40)

## 2016-11-23 LAB — BASIC METABOLIC PANEL
ANION GAP: 8 (ref 5–15)
BUN: 16 mg/dL (ref 6–20)
CALCIUM: 8.9 mg/dL (ref 8.9–10.3)
CO2: 22 mmol/L (ref 22–32)
CREATININE: 0.9 mg/dL (ref 0.44–1.00)
Chloride: 107 mmol/L (ref 101–111)
Glucose, Bld: 150 mg/dL — ABNORMAL HIGH (ref 65–99)
Potassium: 4.2 mmol/L (ref 3.5–5.1)
SODIUM: 137 mmol/L (ref 135–145)

## 2016-11-23 LAB — CBC WITH DIFFERENTIAL/PLATELET
BASOS ABS: 0 10*3/uL (ref 0.0–0.1)
BASOS PCT: 0 %
EOS ABS: 0 10*3/uL (ref 0.0–0.7)
EOS PCT: 0 %
HCT: 36.9 % (ref 36.0–46.0)
Hemoglobin: 11.9 g/dL — ABNORMAL LOW (ref 12.0–15.0)
Lymphocytes Relative: 17 %
Lymphs Abs: 1.5 10*3/uL (ref 0.7–4.0)
MCH: 30.8 pg (ref 26.0–34.0)
MCHC: 32.2 g/dL (ref 30.0–36.0)
MCV: 95.6 fL (ref 78.0–100.0)
MONO ABS: 0.1 10*3/uL (ref 0.1–1.0)
Monocytes Relative: 1 %
Neutro Abs: 7.2 10*3/uL (ref 1.7–7.7)
Neutrophils Relative %: 82 %
PLATELETS: 279 10*3/uL (ref 150–400)
RBC: 3.86 MIL/uL — ABNORMAL LOW (ref 3.87–5.11)
RDW: 14 % (ref 11.5–15.5)
WBC: 8.7 10*3/uL (ref 4.0–10.5)

## 2016-11-23 LAB — ECHOCARDIOGRAM COMPLETE
HEIGHTINCHES: 67 in
WEIGHTICAEL: 3203.2 [oz_av]

## 2016-11-23 LAB — MAGNESIUM: Magnesium: 2.1 mg/dL (ref 1.7–2.4)

## 2016-11-23 MED ORDER — GABAPENTIN 600 MG PO TABS
600.0000 mg | ORAL_TABLET | Freq: Four times a day (QID) | ORAL | Status: DC
  Administered 2016-11-23 – 2016-11-24 (×5): 600 mg via ORAL
  Filled 2016-11-23 (×5): qty 1

## 2016-11-23 MED ORDER — NICOTINE 14 MG/24HR TD PT24
14.0000 mg | MEDICATED_PATCH | Freq: Every day | TRANSDERMAL | Status: DC
  Administered 2016-11-23 – 2016-11-24 (×2): 14 mg via TRANSDERMAL
  Filled 2016-11-23 (×2): qty 1

## 2016-11-23 NOTE — Discharge Summary (Addendum)
Physician Discharge Summary  Laura [name] BLT:[id number] DOB: April 17, 1946 DOA: 11/22/2016  PCP: Benito Mccreedy, MD  Admit date: 11/22/2016 Discharge date: 11/23/2016  Time spent: > 35  minutes  Recommendations for Outpatient Follow-up:  1. Please ensure patient continues routine follow-up with cardiology   Discharge Diagnoses:  Principal Problem:   Chest pain Active Problems:   HLD (hyperlipidemia)   TOBACCO ABUSE   Low back pain with radiation   GERD (gastroesophageal reflux disease)   Bronchitis due to tobacco use (HCC)   Benign essential HTN   Discharge Condition: Stable  Diet recommendation: Heart healthy  Filed Weights   11/22/16 2153  Weight: 90.8 kg (200 lb 3.2 oz)    History of present illness:  71 y.o. female with medical history significant of HTN, HLD, and tobacco abuse; who presents with complaints of chest pain. Patient reports intermittent chest pain over the last 1 month that has acutely worsened in the last 2-3 days  Hospital Course:  Chest pain - Patient had negative workup. Cardiology cleared for discharge. Patient had echocardiogram which reported normal EF with no wall motion abnormalities  For known medical problems listed above continue prior to admission home medication regimen listed below  Procedures:  Echocardiogram  Consultations:  Cardiology  Discharge Exam: Vitals:   11/23/16 1052 11/23/16 1312  BP: 100/60 (!) 114/57  Pulse:  64  Resp:  20  Temp:  97.8 F (36.6 C)    General: Pt in nad, alert and awake Cardiovascular: rrr, no rubs Respiratory: no increased wob, no wheezes  Discharge Instructions   Discharge Instructions    Call MD for:  persistant nausea and vomiting    Complete by:  As directed    Call MD for:  severe uncontrolled pain    Complete by:  As directed    Diet - low sodium heart healthy    Complete by:  As directed    Discharge instructions    Complete by:  As directed    Please be sure to follow  up with your primary care physician or cardiologist in 1-2 weeks or sooner should any new concerns arise.   Increase activity slowly    Complete by:  As directed      Current Discharge Medication List    CONTINUE these medications which have NOT CHANGED   Details  amLODipine (NORVASC) 5 MG tablet Take 1 tablet (5 mg total) by mouth daily. Qty: 90 tablet, Refills: 3    aspirin 81 MG chewable tablet Chew 81 mg by mouth daily.    gabapentin (NEURONTIN) 600 MG tablet TAKE 1 TABLET (600 MG TOTAL) BY MOUTH 3 (THREE) TIMES DAILY. Qty: 270 tablet, Refills: 3    HYDROcodone-acetaminophen (NORCO) 10-325 MG tablet Take 1 tablet by mouth 2 (two) times daily. [facility]    Lifitegrast (XIIDRA) 5 % SOLN Apply 1 drop to eye daily. One drop in both eyes    pantoprazole (PROTONIX) 40 MG tablet TAKE 1 TABLET BY MOUTH EVERY DAY *INSURANCE WILL PAY 04-06-2014 Qty: 90 tablet, Refills: 2    pravastatin (PRAVACHOL) 80 MG tablet Take 1 tablet (80 mg total) by mouth every evening. Qty: 90 tablet, Refills: 4    spironolactone (ALDACTONE) 25 MG tablet Take 25 mg by mouth daily. Refills: 3    tiZANidine (ZANAFLEX) 4 MG tablet Take 4 mg by mouth every 8 (eight) hours as needed for muscle spasms.      STOP taking these medications  nicotine (NICODERM CQ - DOSED IN MG/24 HOURS) 14 mg/24hr patch      loratadine (CLARITIN) 10 MG tablet        No Known Allergies Follow-up Information    [facility] Follow up.   Why:  Call for physician referral list assistance to find a new primary care doctor Contact information: [phone]           The results of significant diagnostics from this hospitalization (including imaging, microbiology, ancillary and laboratory) are listed below for reference.    Significant Diagnostic Studies: Dg Chest 2 View  Result Date: 11/22/2016 CLINICAL DATA:  Left-sided chest pain for 2 weeks EXAM: CHEST  2 VIEW COMPARISON:  08/04/2013,  05/19/2008 FINDINGS: Hyperinflation. No focal infiltrate or effusion. Borderline cardiomegaly. No edema. Mildly tortuous aorta dog. No pneumothorax. Mild degenerative changes. IMPRESSION: Borderline cardiomegaly.  No acute infiltrate or edema. Electronically Signed   By: Donavan Foil M.D.   On: 11/22/2016 19:12    Microbiology: No results found for this or any previous visit (from the past 240 hour(s)).   Labs: Basic Metabolic Panel:  Recent Labs Lab 11/22/16 1832 11/23/16 0424  NA 137 137  K 3.2* 4.2  CL 107 107  CO2 23 22  GLUCOSE 133* 150*  BUN 16 16  CREATININE 0.94 0.90  CALCIUM 8.7* 8.9  MG  --  2.1   Liver Function Tests: No results for input(s): AST, ALT, ALKPHOS, BILITOT, PROT, ALBUMIN in the last 168 hours. No results for input(s): LIPASE, AMYLASE in the last 168 hours. No results for input(s): AMMONIA in the last 168 hours. CBC:  Recent Labs Lab 11/22/16 1832 11/23/16 0424  WBC 9.1 8.7  NEUTROABS  --  7.2  HGB 11.6* 11.9*  HCT 36.3 36.9  MCV 95.3 95.6  PLT 260 279   Cardiac Enzymes:  Recent Labs Lab 11/22/16 2155 11/23/16 0131 11/23/16 0424  TROPONINI <0.03 <0.03 <0.03   BNP: BNP (last 3 results) No results for input(s): BNP in the last 8760 hours.  ProBNP (last 3 results) No results for input(s): PROBNP in the last 8760 hours.  CBG: No results for input(s): GLUCAP in the last 168 hours.   Signed:  Velvet Bathe MD.  [facility] 11/23/2016, 5:02 PM  Pt ok for discharge

## 2016-11-23 NOTE — Consult Note (Signed)
Cardiology Consultation:   Patient ID: Laura [name]; [id number]; 01-13-1946   Admit date: 11/22/2016 Date of Consult: 11/23/2016  Primary Care Provider: Benito Mccreedy, MD Primary Cardiologist: New to Dr. Oval Linsey  Patient Profile:   Laura [name] is a 71 y.o. female with a hx of HTN, HLD, multiple back operations, chronic numbness on legs and ongoing tobacco abuse   who is being seen today for the evaluation of chest pain  at the request of Dr. Wendee Beavers.   Previously seen by Dr. Mare Ferrari once 04/2014 for intermittent chest heaviness and exertional dyspnea. Follow-up echocardiogram 04/20/14 showed LV function of 45-50%, diffuse hypokinesis and grade 1 diastolic dysfunction. Nuclear stress test 04/27/14 showed low risk study with no area of significant ischemia. No obvious wall motion abnormality. No further workup recommended.  Her family history reveals that her mother died of COPD and smoking at age 49.  Her father died of heart failure at age 29.  She continues to smoke 5-6 cigarettes a day > 45 years.   History of Present Illness:   Laura [name] has been having intermittent shortness of breath for the past one month with cough, white sputum and wheezing.  Recently noted chest heaviness whenever she is under a lot of stress as she moving to new apartment. She was went to see her PCP yesterday and sent to ER for further evaluation. She denies any orthopnea, PND, palpitation, dizziness or syncope. Intermittent lower extremity edema. Patient denies any chest heaviness with activity. Pain somewhat improved with sublingual nitroglycerin x 2 in ER. No recurrent chest heaviness. She continues to have sharp chest pain.   Troponin x 3 negative. K of 3.2-->4.2 after supplement. LDL 88. Chest x-ray shows borderline cardiomegaly. No infiltrate or edema. Echocardiogram done-pending reading.  EKG shows normal sinus rhythm at rate of 81 bpm. Repeat EKG this morning reassuring.  Past Medical  History:  Diagnosis Date  . Chronic back pain    "mostly lower; left foot is numb all the time" (11/22/2016)  . Chronic kidney disease    ?renal cyst on MRI   . GERD (gastroesophageal reflux disease)   . High cholesterol   . Lymphadenopathy    documented on chest CT scan 12/15/2005  . Substance abuse    tobacco    Past Surgical History:  Procedure Laterality Date  . ANTERIOR CERVICAL DISCECTOMY     C3-4, C4-5 diskectomy, fusion, plating and allograft,   . BACK SURGERY    . CARPAL TUNNEL WITH CUBITAL TUNNEL Left   . LUMBAR DISC SURGERY  X 2-3  . POSTERIOR CERVICAL FUSION/FORAMINOTOMY     C3-5 fusion with wirin  . POSTERIOR LUMBAR FUSION    . SPINE SURGERY     s/p C3-4, C4-5 diskectomy, fusion, plating and allograft, s/p posterior C3-5 fusion with wiring(02/11/2007 by Dr Lorin Mercy) had psudoarthrosis lumbar surgery 2000)     Inpatient Medications: Scheduled Meds: . amLODipine  5 mg Oral Daily  . aspirin  81 mg Oral Daily  . enoxaparin (LOVENOX) injection  40 mg Subcutaneous Q24H  . gabapentin  600 mg Oral TID  . guaiFENesin  600 mg Oral BID  . ipratropium-albuterol  3 mL Nebulization STAT  . Lifitegrast  1 drop Ophthalmic Daily  . loratadine  10 mg Oral Daily  . pantoprazole  40 mg Oral Daily  . pravastatin  80 mg Oral QPM  . predniSONE  60 mg Oral Q breakfast  . spironolactone  25 mg Oral Daily  Continuous Infusions:  PRN Meds: acetaminophen, gi cocktail, HYDROcodone-acetaminophen, ipratropium-albuterol, morphine injection, nitroGLYCERIN, ondansetron (ZOFRAN) IV, tiZANidine  Allergies:   No Known Allergies  Social History:   Social History   Social History  . Marital status: Divorced    Spouse name: N/A  . Number of children: 0  . Years of education: N/A   Occupational History  .  Unemployed   Social History Main Topics  . Smoking status: Current Every Day Smoker    Packs/day: 0.12    Years: 47.00    Types: Cigarettes  . Smokeless tobacco: Never Used  .  Alcohol use 0.0 oz/week     Comment: 11/22/2016 "2 drinks q other holiday"  . Drug use: Yes    Types: Marijuana     Comment: 66/2018 "I stopped in the 1970s"  . Sexual activity: No   Other Topics Concern  . Not on file   Social History Narrative   Patient's new Phone # [phone] and [phone].    Family History:   The patient's family history includes Diabetes in her father; Hypertension in her father.  ROS:  Please see the history of present illness.  ROS All other ROS reviewed and negative.     Physical Exam/Data:   Vitals:   11/22/16 2153 11/23/16 0141 11/23/16 0425 11/23/16 1052  BP: 125/61 123/69 114/68 100/60  Pulse: 93 78 81   Resp: 18 17 18    Temp: 97.9 F (36.6 C) 98.5 F (36.9 C) 98.5 F (36.9 C)   TempSrc: Oral Other (Comment) Oral   SpO2: 97% 96% 95%   Weight: 200 lb 3.2 oz (90.8 kg)     Height: 5\' 7"  (1.702 m)       Intake/Output Summary (Last 24 hours) at 11/23/16 1247 Last data filed at 11/23/16 0800  Gross per 24 hour  Intake                0 ml  Output                0 ml  Net                0 ml   Filed Weights   11/22/16 2153  Weight: 200 lb 3.2 oz (90.8 kg)   Body mass index is 31.36 kg/m.  General:  Well nourished, well developed, in no acute distress HEENT: normal Lymph: no adenopathy Neck: no JVD Endocrine:  No thryomegaly Vascular: No carotid bruits; FA pulses 2+ bilaterally without bruits  Cardiac:  normal S1, S2; RRR; no murmur Lungs:  clear to auscultation bilaterally with course breath sound, no wheezing, rhonchi or rales  Abd: soft, nontender, no hepatomegaly  Ext: no edema Musculoskeletal:  No deformities, BUE and BLE strength normal and equal Skin: warm and dry  Neuro:  CNs 2-12 intact, no focal abnormalities noted Psych:  Normal affect   Laboratory Data:  Chemistry Recent Labs Lab 11/22/16 1832 11/23/16 0424  NA 137 137  K 3.2* 4.2  CL 107 107  CO2 23 22  GLUCOSE 133* 150*  BUN 16 16  CREATININE 0.94 0.90    CALCIUM 8.7* 8.9  GFRNONAA >60 >60  GFRAA >60 >60  ANIONGAP 7 8    No results for input(s): PROT, ALBUMIN, AST, ALT, ALKPHOS, BILITOT in the last 168 hours. Hematology Recent Labs Lab 11/22/16 1832 11/23/16 0424  WBC 9.1 8.7  RBC 3.81* 3.86*  HGB 11.6* 11.9*  HCT 36.3 36.9  MCV 95.3 95.6  MCH 30.4 30.8  MCHC 32.0 32.2  RDW 13.9 14.0  PLT 260 279   Cardiac Enzymes Recent Labs Lab 11/22/16 2155 11/23/16 0131 11/23/16 0424  TROPONINI <0.03 <0.03 <0.03    Recent Labs Lab 11/22/16 1842  TROPIPOC 0.00    BNPNo results for input(s): BNP, PROBNP in the last 168 hours.  DDimer No results for input(s): DDIMER in the last 168 hours.  Radiology/Studies:  Dg Chest 2 View  Result Date: 11/22/2016 CLINICAL DATA:  Left-sided chest pain for 2 weeks EXAM: CHEST  2 VIEW COMPARISON:  08/04/2013, 05/19/2008 FINDINGS: Hyperinflation. No focal infiltrate or effusion. Borderline cardiomegaly. No edema. Mildly tortuous aorta dog. No pneumothorax. Mild degenerative changes. IMPRESSION: Borderline cardiomegaly.  No acute infiltrate or edema. Electronically Signed   By: Donavan Foil M.D.   On: 11/22/2016 19:12    Assessment and Plan:   1. Chest pain - Atypical. Likely related to acute bronchitis or stress. Troponin negative. EKG without acute changes. She has ruled out. Echocardiogram has been done pending reading. If no significant abnormality she can be discharged with outpatient follow-up. If recurrent chest pain may consider stress test as outpatient.  2. HTN - Stable and well controlled on current regimen.  3. Tobacco abuse - Advised cessation. Education given.  Dr. Oval Linsey to see later today.   Jarrett Soho, PA  11/23/2016 12:47 PM

## 2016-11-23 NOTE — Consult Note (Signed)
Reason for Consult:recurrent chest pain/left arm pain and numbness Referring Physician: .  [facility]  [name]  ZOX:WRUEAV is 71 year old female with past medical history significant for hypertension, hyperlipidemia, GERD, chronic back pain, degenerative joint disease,tobacco abuse was admitted yesterday because of recurrent retrosternal chest pain described as tired feeling, radiating to left arm, associated with numbness and finger diaphoresis and occasional palpitation.  Patient received aspirin and sublingual nitroglycerin with relief of chest pain.  Patient denies any dizziness, lightheadedness or syncope.  EKG done in the ED showed no acute ischemic changes.  3 sets of troponin high has been negative.  Patient denies any recent cardiac workup.   Past Medical History:  Diagnosis Date  . Chronic back pain    "mostly lower; left foot is numb all the time" (11/22/2016)  . Chronic kidney disease    ?renal cyst on MRI   . GERD (gastroesophageal reflux disease)   . High cholesterol   . Lymphadenopathy    documented on chest CT scan 12/15/2005  . Substance abuse    tobacco    Past Surgical History:  Procedure Laterality Date  . ANTERIOR CERVICAL DISCECTOMY     C3-4, C4-5 diskectomy, fusion, plating and allograft,   . BACK SURGERY    . CARPAL TUNNEL WITH CUBITAL TUNNEL Left   . LUMBAR DISC SURGERY  X 2-3  . POSTERIOR CERVICAL FUSION/FORAMINOTOMY     C3-5 fusion with wirin  . POSTERIOR LUMBAR FUSION    . SPINE SURGERY     s/p C3-4, C4-5 diskectomy, fusion, plating and allograft, s/p posterior C3-5 fusion with wiring(02/11/2007 by Dr Lorin Mercy) had psudoarthrosis lumbar surgery 2000)    Family History  Problem Relation Age of Onset  . Diabetes Father   . Hypertension Father     Social History:  reports that she has been smoking Cigarettes.  She has a 5.64 pack-year smoking history. She has never used smokeless tobacco. She reports that she drinks  alcohol. She reports that she uses drugs, including Marijuana.  Allergies: No Known Allergies  Medications: I have reviewed the patient's current medications.  Results for orders placed or performed during the hospital encounter of 11/22/16 (from the past 48 hour(s))  Basic metabolic panel     Status: Abnormal   Collection Time: 11/22/16  6:32 PM  Result Value Ref Range   Sodium 137 135 - 145 mmol/L   Potassium 3.2 (L) 3.5 - 5.1 mmol/L   Chloride 107 101 - 111 mmol/L   CO2 23 22 - 32 mmol/L   Glucose, Bld 133 (H) 65 - 99 mg/dL   BUN 16 6 - 20 mg/dL   Creatinine, Ser 0.94 0.44 - 1.00 mg/dL   Calcium 8.7 (L) 8.9 - 10.3 mg/dL   GFR calc non Af Amer >60 >60 mL/min   GFR calc Af Amer >60 >60 mL/min    Comment: (NOTE) The eGFR has been calculated using the CKD EPI equation. This calculation has not been validated in all clinical situations. eGFR's persistently <60 mL/min signify possible Chronic Kidney Disease.    Anion gap 7 5 - 15  CBC     Status: Abnormal   Collection Time: 11/22/16  6:32 PM  Result Value Ref Range   WBC 9.1 4.0 - 10.5 K/uL   RBC 3.81 (L) 3.87 - 5.11 MIL/uL   Hemoglobin 11.6 (L) 12.0 - 15.0 g/dL   HCT 36.3 36.0 - 46.0 %   MCV 95.3 78.0 - 100.0  fL   MCH 30.4 26.0 - 34.0 pg   MCHC 32.0 30.0 - 36.0 g/dL   RDW 13.9 11.5 - 15.5 %   Platelets 260 150 - 400 K/uL  I-stat troponin, ED     Status: None   Collection Time: 11/22/16  6:42 PM  Result Value Ref Range   Troponin i, poc 0.00 0.00 - 0.08 ng/mL   Comment 3            Comment: Due to the release kinetics of cTnI, a negative result within the first hours of the onset of symptoms does not rule out myocardial infarction with certainty. If myocardial infarction is still suspected, repeat the test at appropriate intervals.   Troponin I-serum (0, 3, 6 hours)     Status: None   Collection Time: 11/22/16  9:55 PM  Result Value Ref Range   Troponin I <0.03 <0.03 ng/mL  Troponin I-serum (0, 3, 6 hours)      Status: None   Collection Time: 11/23/16  1:31 AM  Result Value Ref Range   Troponin I <0.03 <0.03 ng/mL  CBC with Differential/Platelet     Status: Abnormal   Collection Time: 11/23/16  4:24 AM  Result Value Ref Range   WBC 8.7 4.0 - 10.5 K/uL   RBC 3.86 (L) 3.87 - 5.11 MIL/uL   Hemoglobin 11.9 (L) 12.0 - 15.0 g/dL   HCT 36.9 36.0 - 46.0 %   MCV 95.6 78.0 - 100.0 fL   MCH 30.8 26.0 - 34.0 pg   MCHC 32.2 30.0 - 36.0 g/dL   RDW 14.0 11.5 - 15.5 %   Platelets 279 150 - 400 K/uL   Neutrophils Relative % 82 %   Neutro Abs 7.2 1.7 - 7.7 K/uL   Lymphocytes Relative 17 %   Lymphs Abs 1.5 0.7 - 4.0 K/uL   Monocytes Relative 1 %   Monocytes Absolute 0.1 0.1 - 1.0 K/uL   Eosinophils Relative 0 %   Eosinophils Absolute 0.0 0.0 - 0.7 K/uL   Basophils Relative 0 %   Basophils Absolute 0.0 0.0 - 0.1 K/uL  Basic metabolic panel     Status: Abnormal   Collection Time: 11/23/16  4:24 AM  Result Value Ref Range   Sodium 137 135 - 145 mmol/L   Potassium 4.2 3.5 - 5.1 mmol/L    Comment: DELTA CHECK NOTED   Chloride 107 101 - 111 mmol/L   CO2 22 22 - 32 mmol/L   Glucose, Bld 150 (H) 65 - 99 mg/dL   BUN 16 6 - 20 mg/dL   Creatinine, Ser 0.90 0.44 - 1.00 mg/dL   Calcium 8.9 8.9 - 10.3 mg/dL   GFR calc non Af Amer >60 >60 mL/min   GFR calc Af Amer >60 >60 mL/min    Comment: (NOTE) The eGFR has been calculated using the CKD EPI equation. This calculation has not been validated in all clinical situations. eGFR's persistently <60 mL/min signify possible Chronic Kidney Disease.    Anion gap 8 5 - 15  Magnesium     Status: None   Collection Time: 11/23/16  4:24 AM  Result Value Ref Range   Magnesium 2.1 1.7 - 2.4 mg/dL  Troponin I     Status: None   Collection Time: 11/23/16  4:24 AM  Result Value Ref Range   Troponin I <0.03 <0.03 ng/mL  Lipid panel     Status: Abnormal   Collection Time: 11/23/16  4:24 AM  Result Value Ref  Range   Cholesterol 123 0 - 200 mg/dL   Triglycerides 27 <150  mg/dL   HDL 30 (L) >40 mg/dL   Total CHOL/HDL Ratio 4.1 RATIO   VLDL 5 0 - 40 mg/dL   LDL Cholesterol 88 0 - 99 mg/dL    Comment:        Total Cholesterol/HDL:CHD Risk Coronary Heart Disease Risk Table                     Men   Women  1/2 Average Risk   3.4   3.3  Average Risk       5.0   4.4  2 X Average Risk   9.6   7.1  3 X Average Risk  23.4   11.0        Use the calculated Patient Ratio above and the CHD Risk Table to determine the patient's CHD Risk.        ATP III CLASSIFICATION (LDL):  <100     mg/dL   Optimal  100-129  mg/dL   Near or Above                    Optimal  130-159  mg/dL   Borderline  160-189  mg/dL   High  >190     mg/dL   Very High     Dg Chest 2 View  Result Date: 11/22/2016 CLINICAL DATA:  Left-sided chest pain for 2 weeks EXAM: CHEST  2 VIEW COMPARISON:  08/04/2013, 05/19/2008 FINDINGS: Hyperinflation. No focal infiltrate or effusion. Borderline cardiomegaly. No edema. Mildly tortuous aorta dog. No pneumothorax. Mild degenerative changes. IMPRESSION: Borderline cardiomegaly.  No acute infiltrate or edema. Electronically Signed   By: Donavan Foil M.D.   On: 11/22/2016 19:12    Review of Systems  Constitutional: Positive for diaphoresis. Negative for chills and fever.  HENT: Negative for hearing loss.   Eyes: Negative for blurred vision and double vision.  Respiratory: Negative for cough.   Cardiovascular: Positive for chest pain. Negative for leg swelling.  Gastrointestinal: Negative for nausea and vomiting.  Genitourinary: Negative for dysuria.  Neurological: Negative for dizziness.   Blood pressure 100/60, pulse 81, temperature 98.5 F (36.9 C), temperature source Oral, resp. rate 18, height 5' 7" (1.702 m), weight 90.8 kg (200 lb 3.2 oz), SpO2 95 %. Physical Exam  Constitutional: She is oriented to person, place, and time.  HENT:  Head: Normocephalic and atraumatic.  Eyes: Conjunctivae and EOM are normal. Left eye exhibits no discharge.   Neck: Normal range of motion. Neck supple. No JVD present. No tracheal deviation present. No thyromegaly present.  Cardiovascular: Regular rhythm.  Exam reveals no gallop.   No murmur heard. Respiratory: Effort normal and breath sounds normal. No respiratory distress. She has no wheezes. She has no rales.  GI: Soft. Bowel sounds are normal. She exhibits no distension. There is no tenderness. There is no rebound.  Musculoskeletal: She exhibits no edema, tenderness or deformity.  Neurological: She is alert and oriented to person, place, and time.    Assessment/Plan: Recurrent chest pain/left arm pain worrisome for angina.  MI ruled out. Hypertension. Hyperlipidemia. Elevated blood sugar, rule out diabetes mellitus. Normocytic, normochromic anemia, questionable etiology. Chronic back pain. Degenerative joint disease. Tobacco abuse.  GERD   PLAN Agree with present management. Will schedule for Lexiscan Myoview in a.m. Charolette Forward 11/23/2016, 1:07 PM

## 2016-11-23 NOTE — Progress Notes (Signed)
  Echocardiogram 2D Echocardiogram has been performed.  Saifan Rayford L Androw 11/23/2016, 3:27 PM

## 2016-11-23 NOTE — Care Management Obs Status (Signed)
[facility] NOTIFICATION   Patient Details  Name: Laura [name] MRN: [id number] Date of Birth: 01/08/1946   Medicare Observation Status Notification Given:  Yes    Carles Collet, RN 11/23/2016, 4:16 PM

## 2016-11-23 NOTE — Consult Note (Signed)
           [facility] CM Primary Care Navigator  11/23/2016  Laura [name] [birth year] [id number]   Went  to see patient in her room to identify possible discharge needs. Patient confirmed that her primary care provider is Dr. Iona Beard Osei-Bonsu with [facility] which is not under [facility] network.   Patient was encouraged to follow-up with PCP after hospital discharge. She voiced understanding to follow-up with her primary care provider while in the process of looking for a new one as stated. Notified Inpatient CM of patient's plan and to follow-up with needs as necessary.   For questions, please contact:  Dannielle Huh, BSN, RN- [facility] Primary Care Navigator  Telephone: [phone] [place]

## 2016-11-23 NOTE — Progress Notes (Signed)
New Admission Note:   Arrival Method: From ED via stretcher Mental Orientation:  A&O Telemetry: Box 2w06 Assessment: Completed IV: R FA Pain: denies pain at this time Tubes: None Safety Measures: Safety Fall Prevention Plan has been discussed  Admission 2 West Orientation: Patient has been orientated to the room, unit and staff.  Family: none at bedside  Orders to be reviewed and implemented. Will continue to monitor the patient. Call light has been placed within reach and bed alarm has been activated. Tele box applied. CCMD notified    Isac Caddy, RN

## 2016-11-24 ENCOUNTER — Observation Stay (HOSPITAL_COMMUNITY): Payer: Medicare Other

## 2016-11-24 DIAGNOSIS — I10 Essential (primary) hypertension: Secondary | ICD-10-CM | POA: Diagnosis not present

## 2016-11-24 DIAGNOSIS — E785 Hyperlipidemia, unspecified: Secondary | ICD-10-CM | POA: Diagnosis not present

## 2016-11-24 DIAGNOSIS — D649 Anemia, unspecified: Secondary | ICD-10-CM | POA: Diagnosis not present

## 2016-11-24 DIAGNOSIS — R7309 Other abnormal glucose: Secondary | ICD-10-CM | POA: Diagnosis not present

## 2016-11-24 DIAGNOSIS — R079 Chest pain, unspecified: Secondary | ICD-10-CM | POA: Diagnosis not present

## 2016-11-24 MED ORDER — REGADENOSON 0.4 MG/5ML IV SOLN
0.4000 mg | Freq: Once | INTRAVENOUS | Status: AC
  Administered 2016-11-24: 0.4 mg via INTRAVENOUS
  Filled 2016-11-24: qty 5

## 2016-11-24 MED ORDER — TECHNETIUM TC 99M TETROFOSMIN IV KIT
30.0000 | PACK | Freq: Once | INTRAVENOUS | Status: AC | PRN
  Administered 2016-11-24: 30 via INTRAVENOUS

## 2016-11-24 MED ORDER — REGADENOSON 0.4 MG/5ML IV SOLN
INTRAVENOUS | Status: AC
  Filled 2016-11-24: qty 5

## 2016-11-24 MED ORDER — TECHNETIUM TC 99M TETROFOSMIN IV KIT
10.0000 | PACK | Freq: Once | INTRAVENOUS | Status: AC | PRN
  Administered 2016-11-24: 10 via INTRAVENOUS

## 2016-11-24 NOTE — Progress Notes (Signed)
Subjective:  Patient seen in nuclear medicine Department.  Denies any chest pain or shortness of breath.  Objective:  Vital Signs in the last 24 hours: Temp:  [97.7 F (36.5 C)-98.4 F (36.9 C)] 97.7 F (36.5 C) (06/08 0443) Pulse Rate:  [64-105] 82 (06/08 1101) Resp:  [18-20] 19 (06/08 0443) BP: (114-148)/(57-89) 118/62 (06/08 1101) SpO2:  [100 %] 100 % (06/08 0443)  Intake/Output from previous day: 06/07 0701 - 06/08 0700 In: 960 [P.O.:960] Out: -  Intake/Output from this shift: No intake/output data recorded.  Physical Exam: Neck: no adenopathy, no carotid bruit, no JVD and supple, symmetrical, trachea midline Lungs: clear to auscultation bilaterally Heart: regular rate and rhythm, S1, S2 normal and soft systolic murmur noted Abdomen: soft, non-tender; bowel sounds normal; no masses,  no organomegaly Extremities: extremities normal, atraumatic, no cyanosis or edema  Lab Results:  Recent Labs  11/22/16 1832 11/23/16 0424  WBC 9.1 8.7  HGB 11.6* 11.9*  PLT 260 279    Recent Labs  11/22/16 1832 11/23/16 0424  NA 137 137  K 3.2* 4.2  CL 107 107  CO2 23 22  GLUCOSE 133* 150*  BUN 16 16  CREATININE 0.94 0.90    Recent Labs  11/23/16 0131 11/23/16 0424  TROPONINI <0.03 <0.03   Hepatic Function Panel No results for input(s): PROT, ALBUMIN, AST, ALT, ALKPHOS, BILITOT, BILIDIR, IBILI in the last 72 hours.  Recent Labs  11/23/16 0424  CHOL 123   No results for input(s): PROTIME in the last 72 hours.  Imaging: Imaging results have been reviewed and Dg Chest 2 View  Result Date: 11/22/2016 CLINICAL DATA:  Left-sided chest pain for 2 weeks EXAM: CHEST  2 VIEW COMPARISON:  08/04/2013, 05/19/2008 FINDINGS: Hyperinflation. No focal infiltrate or effusion. Borderline cardiomegaly. No edema. Mildly tortuous aorta dog. No pneumothorax. Mild degenerative changes. IMPRESSION: Borderline cardiomegaly.  No acute infiltrate or edema. Electronically Signed   By: Donavan Foil M.D.   On: 11/22/2016 19:12    Cardiac Studies:  Assessment/Plan:  Status postRecurrent chest pain/left arm pain worrisome for angina.  MI ruled out. Hypertension. Hyperlipidemia. Elevated blood sugar, rule out diabetes mellitus. Normocytic, normochromic anemia, questionable etiology. Chronic back pain. Degenerative joint disease. Tobacco abuse. Plan Continue present management. Check Lexiscan Myoview results Discussed with patient regarding lifestyle changes, and smoking cessation. Okay to discharge from cardiac point of view if no evidence of ischemia.  LOS: 0 days    Charolette Forward 11/24/2016, 11:15 AM

## 2016-12-05 DIAGNOSIS — E785 Hyperlipidemia, unspecified: Secondary | ICD-10-CM | POA: Diagnosis not present

## 2016-12-05 DIAGNOSIS — I10 Essential (primary) hypertension: Secondary | ICD-10-CM | POA: Diagnosis not present

## 2016-12-05 DIAGNOSIS — J45909 Unspecified asthma, uncomplicated: Secondary | ICD-10-CM | POA: Diagnosis not present

## 2016-12-05 DIAGNOSIS — Z720 Tobacco use: Secondary | ICD-10-CM | POA: Diagnosis not present

## 2016-12-05 DIAGNOSIS — I119 Hypertensive heart disease without heart failure: Secondary | ICD-10-CM | POA: Diagnosis not present

## 2016-12-08 DIAGNOSIS — G8929 Other chronic pain: Secondary | ICD-10-CM | POA: Diagnosis not present

## 2016-12-08 DIAGNOSIS — M5442 Lumbago with sciatica, left side: Secondary | ICD-10-CM | POA: Diagnosis not present

## 2016-12-13 DIAGNOSIS — K219 Gastro-esophageal reflux disease without esophagitis: Secondary | ICD-10-CM | POA: Diagnosis not present

## 2016-12-13 DIAGNOSIS — E785 Hyperlipidemia, unspecified: Secondary | ICD-10-CM | POA: Diagnosis not present

## 2016-12-13 DIAGNOSIS — M1990 Unspecified osteoarthritis, unspecified site: Secondary | ICD-10-CM | POA: Diagnosis not present

## 2016-12-13 DIAGNOSIS — I10 Essential (primary) hypertension: Secondary | ICD-10-CM | POA: Diagnosis not present

## 2016-12-22 DIAGNOSIS — M5416 Radiculopathy, lumbar region: Secondary | ICD-10-CM | POA: Diagnosis not present

## 2016-12-22 DIAGNOSIS — M5137 Other intervertebral disc degeneration, lumbosacral region: Secondary | ICD-10-CM | POA: Diagnosis not present

## 2016-12-22 DIAGNOSIS — M5136 Other intervertebral disc degeneration, lumbar region: Secondary | ICD-10-CM | POA: Diagnosis not present

## 2017-01-03 DIAGNOSIS — M545 Low back pain: Secondary | ICD-10-CM | POA: Diagnosis not present

## 2017-01-03 DIAGNOSIS — I10 Essential (primary) hypertension: Secondary | ICD-10-CM | POA: Diagnosis not present

## 2017-01-03 DIAGNOSIS — I119 Hypertensive heart disease without heart failure: Secondary | ICD-10-CM | POA: Diagnosis not present

## 2017-01-03 DIAGNOSIS — E785 Hyperlipidemia, unspecified: Secondary | ICD-10-CM | POA: Diagnosis not present

## 2017-01-03 DIAGNOSIS — G8929 Other chronic pain: Secondary | ICD-10-CM | POA: Diagnosis not present

## 2017-03-05 ENCOUNTER — Telehealth: Payer: Self-pay | Admitting: Internal Medicine

## 2017-03-05 NOTE — Telephone Encounter (Signed)
rtc to pt, she states that she is coming back to [facility] as a pt, she states she needs gabapentin, explained need to see pt, she states she has called dr Terrence Dupont to see if he would fill the medicine since she has seen him recently. She states she has been expecting an appt card in the mail and has not rec'd anything

## 2017-03-05 NOTE — Telephone Encounter (Signed)
Pls call pt refill needed

## 2017-03-22 DIAGNOSIS — J069 Acute upper respiratory infection, unspecified: Secondary | ICD-10-CM | POA: Diagnosis not present

## 2017-03-22 DIAGNOSIS — M1990 Unspecified osteoarthritis, unspecified site: Secondary | ICD-10-CM | POA: Diagnosis not present

## 2017-03-22 DIAGNOSIS — I10 Essential (primary) hypertension: Secondary | ICD-10-CM | POA: Diagnosis not present

## 2017-03-22 DIAGNOSIS — E785 Hyperlipidemia, unspecified: Secondary | ICD-10-CM | POA: Diagnosis not present

## 2017-03-23 DIAGNOSIS — M961 Postlaminectomy syndrome, not elsewhere classified: Secondary | ICD-10-CM | POA: Diagnosis not present

## 2017-03-28 ENCOUNTER — Encounter: Payer: Self-pay | Admitting: Internal Medicine

## 2017-03-28 ENCOUNTER — Ambulatory Visit (INDEPENDENT_AMBULATORY_CARE_PROVIDER_SITE_OTHER): Payer: Medicare Other | Admitting: Internal Medicine

## 2017-03-28 VITALS — BP 113/68 | HR 92 | Temp 98.1°F | Ht 70.0 in | Wt 202.7 lb

## 2017-03-28 DIAGNOSIS — E7800 Pure hypercholesterolemia, unspecified: Secondary | ICD-10-CM

## 2017-03-28 DIAGNOSIS — E785 Hyperlipidemia, unspecified: Secondary | ICD-10-CM

## 2017-03-28 DIAGNOSIS — F17210 Nicotine dependence, cigarettes, uncomplicated: Secondary | ICD-10-CM

## 2017-03-28 DIAGNOSIS — Z23 Encounter for immunization: Secondary | ICD-10-CM | POA: Diagnosis not present

## 2017-03-28 DIAGNOSIS — Z79899 Other long term (current) drug therapy: Secondary | ICD-10-CM

## 2017-03-28 DIAGNOSIS — Z780 Asymptomatic menopausal state: Secondary | ICD-10-CM

## 2017-03-28 DIAGNOSIS — Z13820 Encounter for screening for osteoporosis: Secondary | ICD-10-CM | POA: Diagnosis not present

## 2017-03-28 DIAGNOSIS — K635 Polyp of colon: Secondary | ICD-10-CM | POA: Diagnosis not present

## 2017-03-28 DIAGNOSIS — Z0000 Encounter for general adult medical examination without abnormal findings: Secondary | ICD-10-CM

## 2017-03-28 DIAGNOSIS — F17200 Nicotine dependence, unspecified, uncomplicated: Secondary | ICD-10-CM

## 2017-03-28 DIAGNOSIS — I10 Essential (primary) hypertension: Secondary | ICD-10-CM

## 2017-03-28 MED ORDER — VARENICLINE TARTRATE 0.5 MG X 11 & 1 MG X 42 PO MISC: ORAL | 0 refills | Status: DC

## 2017-03-28 NOTE — Patient Instructions (Signed)
Thank you for your visit today Please have your bone density scan, and your mammogram done Please take the chantix per the instructions Please follow up in 1 month here

## 2017-03-28 NOTE — Progress Notes (Signed)
    CC: HTN, HLD, smoking, HM HPI: [name] is a 71 y.o. woman with PMH noted below here for HTN, HLD, smoking, HM  Please see Problem List/A&P for the status of the patient's chronic medical problems   Past Medical History:  Diagnosis Date  . Chronic back pain    "mostly lower; left foot is numb all the time" (11/22/2016)  . Chronic kidney disease    ?renal cyst on MRI   . GERD (gastroesophageal reflux disease)   . High cholesterol   . Lymphadenopathy    documented on chest CT scan 12/15/2005  . Substance abuse    tobacco   Social history: 20 pack year smoking history  Review of Systems: Constitutional: Negative for fever, chills, weight loss and malaise/fatigue.  HEENT: No headaches, vision problems, cough, hearing problems  Respiratory: Negative for cough,  Can sometimes have some wheezing but its not affecting her adls Cardiovascular: Negative for chest pain, orthopnea or PND Gastrointestinal: Negative for heartburn, nausea, vomiting, abdominal pain, diarrhea and constipation.   Physical Exam: Vitals:   03/28/17 1012  BP: 113/68  Pulse: 92  Temp: 98.1 F (36.7 C)  TempSrc: Oral  SpO2: 96%  Weight: 202 lb 11.2 oz (91.9 kg)    General: A&O, in NAD Neck: supple, midline trachea, no cervical lymphadenopathy  CV: RRR, normal s1, s2, no m/r/g Resp: equal and symmetric breath sounds, no wheezing or crackles  Abdomen: soft, nontender, nondistended, +BS Skin: warm, dry, intact Extremities: no edema   Assessment & Plan:   See encounters tab for problem based medical decision making. Patient discussed with Dr. Evette Doffing

## 2017-03-28 NOTE — Assessment & Plan Note (Signed)
BP Readings from Last 3 Encounters:  03/28/17 113/68  11/24/16 114/64  03/15/16 132/83   Patient is currently on amlodipine and spironolactone for her HTN. It is at goal today. She was admitted earlier for chest pain and had an echo which showed normal EF and G1DD, otherwise unremarkable.  Plan -continue current regimen

## 2017-03-28 NOTE — Addendum Note (Signed)
Addended by: Lalla Brothers T on: 03/28/2017 03:29 PM   Modules accepted: Level of Service

## 2017-03-28 NOTE — Assessment & Plan Note (Signed)
Patient continues to smoke 6 cigs a day, and has a 20 pack year smoking history. She has a strong desire to quit smoking and has already obtained nicotine patches through her medical insurance. She would like pharmacologic therapy for it  Explained about chantix and its side effects- pt is willing to try it. She denies any history of depression.  Plan -chantix starter pack

## 2017-03-28 NOTE — Assessment & Plan Note (Signed)
Patient is currently compliant on pravastatin. Her ASCVD score is 10.5% . Lipid panel was obtained int he summer when she was hospitalized.  Plan -continue pravastatin

## 2017-03-28 NOTE — Assessment & Plan Note (Addendum)
Patient received flu vaccine today Screening for osteoporosis is indicated- I explained her indications and what the results may show.  Last mammogram was in 2015 and was bi-rads 1. Last colonoscopy in 2012 showed multiple hyperplastic polyps and tubular adenomas. Recommended follow up colonoscopy was in 3 year interval which she has not been able to do yet  Plan -Ordered DEXA scan  -Ordered mammogram.  -She apparently had a pap smear earlier in the year per pt report, but it is not indicated for her given that she is >65 -Ordered colonoscopy repeat - I have changed the modifier to reflect 3 year interval

## 2017-03-28 NOTE — Progress Notes (Signed)
Internal Medicine Clinic Attending  Case discussed with Dr. Saraiya at the time of the visit.  We reviewed the resident's history and exam and pertinent patient test results.  I agree with the assessment, diagnosis, and plan of care documented in the resident's note.  

## 2017-04-06 DIAGNOSIS — Z13820 Encounter for screening for osteoporosis: Secondary | ICD-10-CM | POA: Insufficient documentation

## 2017-04-06 NOTE — Assessment & Plan Note (Signed)
dexa ordered

## 2017-04-16 NOTE — Addendum Note (Signed)
Addended by: Marcelino Duster on: 04/16/2017 02:35 PM   Modules accepted: Orders

## 2017-05-02 ENCOUNTER — Encounter: Payer: Self-pay | Admitting: Internal Medicine

## 2017-05-02 ENCOUNTER — Encounter (INDEPENDENT_AMBULATORY_CARE_PROVIDER_SITE_OTHER): Payer: Self-pay

## 2017-05-02 ENCOUNTER — Ambulatory Visit (INDEPENDENT_AMBULATORY_CARE_PROVIDER_SITE_OTHER): Payer: Medicare Other | Admitting: Internal Medicine

## 2017-05-02 ENCOUNTER — Other Ambulatory Visit: Payer: Self-pay | Admitting: Internal Medicine

## 2017-05-02 VITALS — BP 129/68 | HR 97 | Temp 97.7°F | Ht 70.0 in | Wt 203.0 lb

## 2017-05-02 DIAGNOSIS — E7800 Pure hypercholesterolemia, unspecified: Secondary | ICD-10-CM | POA: Diagnosis not present

## 2017-05-02 DIAGNOSIS — I10 Essential (primary) hypertension: Secondary | ICD-10-CM

## 2017-05-02 DIAGNOSIS — Z13820 Encounter for screening for osteoporosis: Secondary | ICD-10-CM

## 2017-05-02 DIAGNOSIS — R739 Hyperglycemia, unspecified: Secondary | ICD-10-CM | POA: Insufficient documentation

## 2017-05-02 DIAGNOSIS — Z0000 Encounter for general adult medical examination without abnormal findings: Secondary | ICD-10-CM | POA: Diagnosis not present

## 2017-05-02 DIAGNOSIS — F17200 Nicotine dependence, unspecified, uncomplicated: Secondary | ICD-10-CM

## 2017-05-02 DIAGNOSIS — Z1231 Encounter for screening mammogram for malignant neoplasm of breast: Secondary | ICD-10-CM

## 2017-05-02 LAB — POCT GLYCOSYLATED HEMOGLOBIN (HGB A1C): HEMOGLOBIN A1C: 5.7

## 2017-05-02 LAB — GLUCOSE, CAPILLARY: GLUCOSE-CAPILLARY: 117 mg/dL — AB (ref 65–99)

## 2017-05-02 MED ORDER — SPIRONOLACTONE 25 MG PO TABS: 25.0000 mg | ORAL_TABLET | Freq: Every day | ORAL | 3 refills | Status: DC

## 2017-05-02 MED ORDER — AMLODIPINE BESYLATE 5 MG PO TABS: 5.0000 mg | ORAL_TABLET | Freq: Every day | ORAL | 3 refills | Status: DC

## 2017-05-02 NOTE — Assessment & Plan Note (Addendum)
Patient had already received flu vaccine  Has not done dexa scan yet or colonoscopy, or mammogram. Last mammogram was in 2015 and was bi-rads 1. Last colonoscopy in 2012 showed multiple hyperplastic polyps and tubular adenomas. Recommended follow up colonoscopy was in 3 year interval which she has not been able to do yet  Plan -DEXA scan  -mammogram.  -colonoscopy repeat

## 2017-05-02 NOTE — Assessment & Plan Note (Signed)
Pt has obtained chantix but has not started that yet and continues to smoke. She says she is highly motivated to quit smoking and would like to set a quit date before the holidays  Plan -counseled on smoking cessation, she already has nicotine patches  -follow up in 3 months

## 2017-05-02 NOTE — Assessment & Plan Note (Signed)
Dexa ordered last time

## 2017-05-02 NOTE — Assessment & Plan Note (Signed)
BP Readings from Last 3 Encounters:  05/02/17 129/68  03/28/17 113/68  11/24/16 114/64   Patient's blood pressure is at goal and she is compliant with amlodipine and spironolactone  Plan -continue amlodipine and spironolactone -smoking cessation counseling provided -follow up in 3 months

## 2017-05-02 NOTE — Assessment & Plan Note (Signed)
She is currently compliant on pravastatin.This problem is chronic and stable  Plan -continue pravastatin

## 2017-05-02 NOTE — Assessment & Plan Note (Signed)
Prior BMTs had shown elevated CBGs. Screened for diabetes, and her A1c is 5.7.  I have explained to her that she is at higher risk for developing diabetes later on, and counseled on dietary changes, and lifestyle. She currently drinks a lot of sodas and juices.  Plan -continue to monitor

## 2017-05-02 NOTE — Progress Notes (Signed)
    CC: HTN, HLD, smoking, HM, hyperglycemia HPI: Laura [name] is a 71 y.o. woman with PMH noted below here for HTN, HLD, smoking, HM, hyperglycemia  Please see Problem List/A&P for the status of the patient's chronic medical problems   Past Medical History:  Diagnosis Date  . Chronic back pain    "mostly lower; left foot is numb all the time" (11/22/2016)  . Chronic kidney disease    ?renal cyst on MRI   . GERD (gastroesophageal reflux disease)   . High cholesterol   . Lymphadenopathy    documented on chest CT scan 12/15/2005  . Substance abuse ([place])    tobacco    Review of Systems:  Constitutional: Negative for fever, chills, weight loss and malaise/fatigue.  HEENT: No headaches, vision problems, has intermittent dry cough Respiratory: Negative for shortness of breath and wheezing.  Cardiovascular: Negative for chest pain  Gastrointestinal: Negative for heartburn, nausea, vomiting, abdominal pain, diarrhea   Physical Exam: Vitals:   05/02/17 1446  BP: 129/68  Pulse: 97  Temp: 97.7 F (36.5 C)  TempSrc: Oral  SpO2: 99%  Weight: 203 lb (92.1 kg)  Height: 5\' 10"  (1.778 m)    General: A&O, in NAD CV: RRR, normal s1, s2, no m/r/g,  Resp: equal and symmetric breath sounds, no wheezing or crackles  Abdomen: soft, nontender, nondistended, +BS Skin: warm, dry, intact,  Extremities: no edema   Assessment & Plan:   See encounters tab for problem based medical decision making. Patient discussed with Dr. Rebeca Alert

## 2017-05-02 NOTE — Progress Notes (Signed)
Internal Medicine Clinic Attending  Case discussed with Dr. Saraiya  at the time of the visit.  We reviewed the resident's history and exam and pertinent patient test results.  I agree with the assessment, diagnosis, and plan of care documented in the resident's note.  Alexander N Raines, MD   

## 2017-05-02 NOTE — Patient Instructions (Addendum)
Thank you for your visit today Please get the colonoscopy, mammogram, and the bone scan test done for osteoporosis  Please work on your smoking set a firm quit date and start the chantix accordingly, and work on the strategies we talked about Your A1c was 5.7, so you are at slightly increased risk of having diabetes later, so please actively work on eating healthier, and exercising more often Please follow up in 3 months

## 2017-05-18 ENCOUNTER — Other Ambulatory Visit: Payer: Self-pay | Admitting: *Deleted

## 2017-05-21 ENCOUNTER — Other Ambulatory Visit: Payer: Self-pay | Admitting: Internal Medicine

## 2017-05-21 MED ORDER — PRAVASTATIN SODIUM 80 MG PO TABS: 80.0000 mg | ORAL_TABLET | Freq: Every evening | ORAL | 4 refills | Status: DC

## 2017-05-21 NOTE — Telephone Encounter (Signed)
Patient requesting refills on medicine °

## 2017-05-22 ENCOUNTER — Other Ambulatory Visit: Payer: Self-pay

## 2017-05-22 NOTE — Telephone Encounter (Signed)
Question about pravastatin (PRAVACHOL) 80 MG tablet. Please call pt back.

## 2017-05-24 ENCOUNTER — Telehealth: Payer: Self-pay | Admitting: *Deleted

## 2017-05-24 MED ORDER — GABAPENTIN 600 MG PO TABS
600.0000 mg | ORAL_TABLET | Freq: Three times a day (TID) | ORAL | 2 refills | Status: DC
Start: 2017-05-24 — End: 2017-07-03

## 2017-05-24 MED ORDER — SPIRONOLACTONE 25 MG PO TABS: 25.0000 mg | ORAL_TABLET | Freq: Every day | ORAL | 1 refills | Status: DC

## 2017-05-24 MED ORDER — PANTOPRAZOLE SODIUM 40 MG PO TBEC: 40.0000 mg | DELAYED_RELEASE_TABLET | Freq: Every day | ORAL | 1 refills | Status: DC

## 2017-05-24 NOTE — Telephone Encounter (Signed)
Pt calls and is NOT happy, she wants to take 10mg  pravachol not 80mg , she states she has been taking 10mg  for a long time and you just cant change it to 80mg  without discussing it with her. Please call her if you are going to do 80mg  because she is not happy

## 2017-05-24 NOTE — Telephone Encounter (Signed)
I discussed with [name] the pt and left voicemail at both numbers to discuss cholesterol medicine. Will reach out to pt again.

## 2017-05-24 NOTE — Telephone Encounter (Signed)
Will discuss abt pravastatin with pt- left her voicemail so not refill that Refilling her other med requests  Thanks

## 2017-05-24 NOTE — Telephone Encounter (Signed)
Talked to [name]. Called the pt again twice on the phones. Will try to call again.

## 2017-05-25 ENCOUNTER — Telehealth: Payer: Self-pay | Admitting: Internal Medicine

## 2017-05-25 NOTE — Telephone Encounter (Signed)
Called the patient at both numbers- and left voicemail- unable to reach.

## 2017-05-25 NOTE — Telephone Encounter (Signed)
Called the pt at 2:20 PM, and explained the rationale for pravastatin 80 mg. Pt understands and is willing to take it. I explained to call us if she is not able to tolerate it and then can switch it or reduce the dose

## 2017-05-25 NOTE — Telephone Encounter (Signed)
Patient calling medicine, pls call patient

## 2017-05-31 ENCOUNTER — Other Ambulatory Visit: Payer: Self-pay | Admitting: Internal Medicine

## 2017-05-31 DIAGNOSIS — E2839 Other primary ovarian failure: Secondary | ICD-10-CM

## 2017-06-04 ENCOUNTER — Telehealth: Payer: Self-pay | Admitting: Internal Medicine

## 2017-06-04 NOTE — Telephone Encounter (Signed)
Rtc, lm for rtc 

## 2017-06-04 NOTE — Telephone Encounter (Signed)
Patient feeling light headed after she started a medication last week.  Patient would like a call back.

## 2017-06-05 ENCOUNTER — Ambulatory Visit
Admission: RE | Admit: 2017-06-05 | Discharge: 2017-06-05 | Disposition: A | Discharge status: Home / Self Care | Payer: Medicare Other | Source: Ambulatory Visit | Attending: Student in an Organized Health Care Education/Training Program | Admitting: Student in an Organized Health Care Education/Training Program

## 2017-06-05 DIAGNOSIS — Z0000 Encounter for general adult medical examination without abnormal findings: Secondary | ICD-10-CM

## 2017-06-05 DIAGNOSIS — Z1231 Encounter for screening mammogram for malignant neoplasm of breast: Secondary | ICD-10-CM

## 2017-06-07 NOTE — Telephone Encounter (Signed)
No naswer

## 2017-06-08 NOTE — Telephone Encounter (Signed)
No answer

## 2017-06-15 ENCOUNTER — Telehealth: Payer: Self-pay | Admitting: Internal Medicine

## 2017-06-15 MED ORDER — PRAVASTATIN SODIUM 40 MG PO TABS: 40.0000 mg | ORAL_TABLET | Freq: Every day | ORAL | 2 refills | Status: DC

## 2017-06-15 NOTE — Telephone Encounter (Signed)
When I called to discuss mammogram result, pt stated she does not like pravastatin 80 mg and is not willing to take it. She is also not willing to switch to atorvastatin. She is willing to try pravastatin 40 mg, even though it is moderate intensity statin.  -Ordered pravastatin 40 mg daily

## 2017-06-22 ENCOUNTER — Other Ambulatory Visit: Payer: Self-pay | Admitting: Cardiology

## 2017-06-22 DIAGNOSIS — R079 Chest pain, unspecified: Secondary | ICD-10-CM

## 2017-06-22 DIAGNOSIS — M1990 Unspecified osteoarthritis, unspecified site: Secondary | ICD-10-CM | POA: Diagnosis not present

## 2017-06-22 DIAGNOSIS — E785 Hyperlipidemia, unspecified: Secondary | ICD-10-CM | POA: Diagnosis not present

## 2017-06-22 DIAGNOSIS — I209 Angina pectoris, unspecified: Secondary | ICD-10-CM | POA: Diagnosis not present

## 2017-06-22 DIAGNOSIS — K219 Gastro-esophageal reflux disease without esophagitis: Secondary | ICD-10-CM | POA: Diagnosis not present

## 2017-06-22 DIAGNOSIS — I10 Essential (primary) hypertension: Secondary | ICD-10-CM | POA: Diagnosis not present

## 2017-06-26 ENCOUNTER — Ambulatory Visit
Admission: RE | Admit: 2017-06-26 | Discharge: 2017-06-26 | Disposition: A | Discharge status: Home / Self Care | Payer: Medicare Other | Source: Ambulatory Visit | Attending: Internal Medicine | Admitting: Internal Medicine

## 2017-06-26 DIAGNOSIS — M85851 Other specified disorders of bone density and structure, right thigh: Secondary | ICD-10-CM | POA: Diagnosis not present

## 2017-06-26 DIAGNOSIS — Z780 Asymptomatic menopausal state: Secondary | ICD-10-CM | POA: Diagnosis not present

## 2017-06-26 DIAGNOSIS — E2839 Other primary ovarian failure: Secondary | ICD-10-CM

## 2017-06-29 ENCOUNTER — Ambulatory Visit (HOSPITAL_COMMUNITY)
Admission: RE | Admit: 2017-06-29 | Discharge: 2017-06-29 | Disposition: A | Discharge status: Home / Self Care | Payer: Medicare Other | Source: Ambulatory Visit | Attending: Cardiology | Admitting: Cardiology

## 2017-06-29 DIAGNOSIS — E785 Hyperlipidemia, unspecified: Secondary | ICD-10-CM | POA: Diagnosis not present

## 2017-06-29 DIAGNOSIS — R9439 Abnormal result of other cardiovascular function study: Secondary | ICD-10-CM | POA: Diagnosis not present

## 2017-06-29 DIAGNOSIS — R079 Chest pain, unspecified: Secondary | ICD-10-CM | POA: Diagnosis not present

## 2017-06-29 DIAGNOSIS — M1990 Unspecified osteoarthritis, unspecified site: Secondary | ICD-10-CM | POA: Diagnosis not present

## 2017-06-29 DIAGNOSIS — I10 Essential (primary) hypertension: Secondary | ICD-10-CM | POA: Diagnosis not present

## 2017-06-29 DIAGNOSIS — I209 Angina pectoris, unspecified: Secondary | ICD-10-CM | POA: Diagnosis not present

## 2017-06-29 MED ORDER — TECHNETIUM TC 99M TETROFOSMIN IV KIT
10.0000 | PACK | Freq: Once | INTRAVENOUS | Status: AC | PRN
  Administered 2017-06-29: 10 via INTRAVENOUS

## 2017-06-29 MED ORDER — REGADENOSON 0.4 MG/5ML IV SOLN
INTRAVENOUS | Status: AC
  Administered 2017-06-29: 0.4 mg via INTRAVENOUS
  Filled 2017-06-29: qty 5

## 2017-06-29 MED ORDER — TECHNETIUM TC 99M TETROFOSMIN IV KIT
30.0000 | PACK | Freq: Once | INTRAVENOUS | Status: AC | PRN
  Administered 2017-06-29: 30 via INTRAVENOUS

## 2017-06-29 MED ORDER — REGADENOSON 0.4 MG/5ML IV SOLN
0.4000 mg | Freq: Once | INTRAVENOUS | Status: AC
  Administered 2017-06-29: 0.4 mg via INTRAVENOUS

## 2017-07-02 ENCOUNTER — Telehealth: Payer: Self-pay | Admitting: Internal Medicine

## 2017-07-02 NOTE — Telephone Encounter (Signed)
Patient wants to speak to someone about her medicine, physician change her medicine.  Patient didn't get the recording we was closed from 8-1pm today, it had her hold waiting to get a nurse

## 2017-07-02 NOTE — Telephone Encounter (Signed)
Pt states she has been taking the gabapentin 600mg  tabs four times a day, but her prescription reflects 3 times a day dosing.  Wants to know if she can continue the four times a day dosing (if so, she will need a new rx) as it helps with her leg pain.  Will forward request to pcp, please advise.Regenia Skeeter, Briget Shaheed Cassady1/14/20194:00 PM

## 2017-07-03 ENCOUNTER — Telehealth: Payer: Self-pay

## 2017-07-03 MED ORDER — GABAPENTIN 600 MG PO TABS: 600.0000 mg | ORAL_TABLET | Freq: Four times a day (QID) | ORAL | 1 refills | Status: DC

## 2017-07-03 NOTE — Telephone Encounter (Signed)
I have sent the 4 times a day dosing for now- will discuss at next visit  Thanks

## 2017-07-03 NOTE — Telephone Encounter (Signed)
Pt needs to make appt to be seen in [facility] for her symptoms- I can not prescribe medicines without pt being seen.    Thanks

## 2017-07-03 NOTE — Telephone Encounter (Signed)
Called pt - since Friday, cough,clear phlegm; weakness; right ear pain; no fever; hoarseness-getting worse.  Stated she had flu shot.  Requesting an OTC or rx medication - something she can afford; does not want to make an appt at this time. Please advise . Thanks

## 2017-07-03 NOTE — Telephone Encounter (Signed)
Pt called / informed she needs to schedule an appt per Dr Tiburcio Pea. Appt scheduled for tomorrow in [facility] at 1315 PM.

## 2017-07-03 NOTE — Telephone Encounter (Signed)
Pt states she is sick, requesting an advised for cold medicine. Offered an appt to come in, pt refused. Please call pt back.

## 2017-07-04 ENCOUNTER — Other Ambulatory Visit: Payer: Self-pay

## 2017-07-04 ENCOUNTER — Ambulatory Visit (INDEPENDENT_AMBULATORY_CARE_PROVIDER_SITE_OTHER): Payer: Medicare Other | Admitting: Internal Medicine

## 2017-07-04 VITALS — BP 129/80 | HR 110 | Temp 98.1°F | Wt 201.6 lb

## 2017-07-04 DIAGNOSIS — N189 Chronic kidney disease, unspecified: Secondary | ICD-10-CM

## 2017-07-04 DIAGNOSIS — I129 Hypertensive chronic kidney disease with stage 1 through stage 4 chronic kidney disease, or unspecified chronic kidney disease: Secondary | ICD-10-CM

## 2017-07-04 DIAGNOSIS — J069 Acute upper respiratory infection, unspecified: Secondary | ICD-10-CM

## 2017-07-04 DIAGNOSIS — Z720 Tobacco use: Secondary | ICD-10-CM | POA: Diagnosis not present

## 2017-07-04 DIAGNOSIS — H938X1 Other specified disorders of right ear: Secondary | ICD-10-CM | POA: Insufficient documentation

## 2017-07-04 NOTE — Patient Instructions (Addendum)
FOLLOW-UP INSTRUCTIONS When: 08/06/2017 (appointment already scheduled) For: health maintenance What to bring: medications   Laura [name],  It was a pleasure to meet you today.  You most likely have a common cold from a virus. It sounds like you are getting better with everything that you have been doing at home. Please continue to use your cough medicine and Tylenol PM as needed for your symptoms. If your symptoms do not improve in the next 3-5 days, please return to clinic for further evaluation.  For your right ear, we are sending a referral to the Ear, Nose, and Throat doctors. They will be able to help tell us what we are seeing in your right ear.  For your heart, please call your heart doctors to schedule a follow up appointment after the stress test that you had done.  Please return to clinic on 08/06/2017 for an appointment with your primary doctor.

## 2017-07-04 NOTE — Progress Notes (Signed)
   CC: cough, right ear pain  HPI:  [name] is a 72 y.o. female with PMH significant for HTN, CKD and tobacco use who presents with cough and right ear pain since 1/11.  Since Friday 1/11, she endorses cough productive of nonbloody clear phlegm, weakness, right ear ache, sore throat, and hoarseness. She endorses wheezing and shortness of breath and exertion, as well as some myalgias and chills. She has used cough drops, cough syrup, Tylenol PM, and Mucinex with some relief in her symptoms. She states that all of these symptoms have improved today. She did get the flu shot. She denies fevers or chest pain. Denies sick contacts, recent travel, rashes, or recent antibiotic use.  She just had a stress test on Friday 1/11. She was called by cardiology and told that it was abnormal. She will follow up with cardiology regarding next steps.  Past Medical History:  Diagnosis Date  . Chronic back pain    "mostly lower; left foot is numb all the time" (11/22/2016)  . Chronic kidney disease    ?renal cyst on MRI   . GERD (gastroesophageal reflux disease)   . High cholesterol   . Lymphadenopathy    documented on chest CT scan 12/15/2005  . Substance abuse ([place])    tobacco   Review of Systems:  GEN: No fevers. Endorses chills and mild myalgias HEENT: Negative for sinus pain or tenderness. Positive for sore throat, congestion, runny nose. Positive for right ear ache. CV: No chest pain. PULM: Positive for wheezing and DOE. MSK: Negative for edema.  Physical Exam:  Vitals:   07/04/17 1324  BP: 129/80  Pulse: (!) 110  Temp: 98.1 F (36.7 C)  TempSrc: Oral  SpO2: 98%  Weight: 201 lb 9.6 oz (91.4 kg)   GEN: Sitting comfortably in chair in NAD. Coughs intermittently throughout exam. HEENT: [place]/AT. MMM. Mild erythema in posterior pharynx. No visible lesions. No sinus pain or tenderness. PERRL. Right ear with 1cm white mass in ear canal. Difficult to assess TM in R ear due to the mass.  Left ear exam normal. Decreased hearing in right ear. NECK: No tender cervical LAD. CV: Mildly tachycardia. RR, no m/r/g PULM: CTAB; no wheezes or rhonchi MSK: No BLE edema. Capillary refill < 2 sec.  Assessment & Plan:   See Encounters Tab for problem based charting.  Patient seen with Dr. Simeon Craft, MD Internal Medicine, PGY-1

## 2017-07-04 NOTE — Assessment & Plan Note (Signed)
Assessment She noted right earache with onset of viral-like symptoms on Friday. Her right ear pain improved. On physical exam, there is a approximately 1 cm white mass in her ear canal that is preventing visualization of her tympanic membrane. She does notice decreased hearing from her right ear. She does state that she occasionally uses Q-tips in her ears. She also states she tried using bobby pin recently because she felt like something was moving around in her right ear. She states she got a small amount of white substance out when she did this. Differential includes foreign body versus cholesteatoma  Plan - Referral to ENT placed

## 2017-07-04 NOTE — Assessment & Plan Note (Signed)
Assessment She endorses viral-like symptoms for the last 5 days. She has been treating herself symptomatically with cough medications and Tylenol PM and she reports significant improvement today. Afebrile with stable vital signs today. Pulmonary exam without wheezing or rhonchi.  Her symptoms are most consistent with a viral URI that is now improving. We'll continue symptomatic therapy and advised patient to return to clinic if her symptoms do not improve in the next few days.  Plan - Continue supportive therapy - Return to clinic in 3-5 days if symptoms do not improve - Return to clinic on February 18 for PCP visit

## 2017-07-06 DIAGNOSIS — I10 Essential (primary) hypertension: Secondary | ICD-10-CM | POA: Diagnosis not present

## 2017-07-06 DIAGNOSIS — K219 Gastro-esophageal reflux disease without esophagitis: Secondary | ICD-10-CM | POA: Diagnosis not present

## 2017-07-06 DIAGNOSIS — E785 Hyperlipidemia, unspecified: Secondary | ICD-10-CM | POA: Diagnosis not present

## 2017-07-06 DIAGNOSIS — I208 Other forms of angina pectoris: Secondary | ICD-10-CM | POA: Diagnosis not present

## 2017-07-10 ENCOUNTER — Other Ambulatory Visit: Payer: Self-pay | Admitting: Internal Medicine

## 2017-07-10 NOTE — Progress Notes (Signed)
Internal Medicine Clinic Attending  I saw and evaluated the patient.  I personally confirmed the key portions of the history and exam documented by Dr. Huang and I reviewed pertinent patient test results.  The assessment, diagnosis, and plan were formulated together and I agree with the documentation in the resident's note.  

## 2017-07-12 DIAGNOSIS — T161XXA Foreign body in right ear, initial encounter: Secondary | ICD-10-CM | POA: Diagnosis not present

## 2017-07-13 ENCOUNTER — Encounter: Payer: Self-pay | Admitting: Internal Medicine

## 2017-07-26 DIAGNOSIS — M961 Postlaminectomy syndrome, not elsewhere classified: Secondary | ICD-10-CM | POA: Diagnosis not present

## 2017-07-26 DIAGNOSIS — G894 Chronic pain syndrome: Secondary | ICD-10-CM | POA: Diagnosis not present

## 2017-07-26 DIAGNOSIS — Z79899 Other long term (current) drug therapy: Secondary | ICD-10-CM | POA: Diagnosis not present

## 2017-07-26 DIAGNOSIS — M545 Low back pain: Secondary | ICD-10-CM | POA: Diagnosis not present

## 2017-08-03 DIAGNOSIS — M5136 Other intervertebral disc degeneration, lumbar region: Secondary | ICD-10-CM | POA: Diagnosis not present

## 2017-08-03 DIAGNOSIS — M5117 Intervertebral disc disorders with radiculopathy, lumbosacral region: Secondary | ICD-10-CM | POA: Diagnosis not present

## 2017-08-03 DIAGNOSIS — M5416 Radiculopathy, lumbar region: Secondary | ICD-10-CM | POA: Diagnosis not present

## 2017-08-06 ENCOUNTER — Encounter: Payer: Self-pay | Admitting: Internal Medicine

## 2017-08-06 ENCOUNTER — Ambulatory Visit (INDEPENDENT_AMBULATORY_CARE_PROVIDER_SITE_OTHER): Payer: Medicare Other | Admitting: Internal Medicine

## 2017-08-06 ENCOUNTER — Other Ambulatory Visit: Payer: Self-pay

## 2017-08-06 ENCOUNTER — Encounter (INDEPENDENT_AMBULATORY_CARE_PROVIDER_SITE_OTHER): Payer: Self-pay

## 2017-08-06 VITALS — BP 126/59 | HR 69 | Temp 97.7°F | Ht 67.0 in | Wt 203.2 lb

## 2017-08-06 DIAGNOSIS — Z79899 Other long term (current) drug therapy: Secondary | ICD-10-CM

## 2017-08-06 DIAGNOSIS — B070 Plantar wart: Secondary | ICD-10-CM | POA: Insufficient documentation

## 2017-08-06 DIAGNOSIS — I129 Hypertensive chronic kidney disease with stage 1 through stage 4 chronic kidney disease, or unspecified chronic kidney disease: Secondary | ICD-10-CM | POA: Diagnosis not present

## 2017-08-06 DIAGNOSIS — N189 Chronic kidney disease, unspecified: Secondary | ICD-10-CM

## 2017-08-06 DIAGNOSIS — I10 Essential (primary) hypertension: Secondary | ICD-10-CM

## 2017-08-06 MED ORDER — SALICYLIC ACID 26 % EX SOLN: 2.0000 [drp] | Freq: Two times a day (BID) | CUTANEOUS | 0 refills | Status: DC

## 2017-08-06 NOTE — Progress Notes (Addendum)
   CC: Follow-up of her hypertension and right hand lesion  HPI:  Laura [name] is a 72 y.o. who presented to the clinic for continued evaluation and management of her hypertension. She also voiced concerns about a right hand lesion. For detailed evaluation and management of both problems please refer to problem-based charting below.  Past Medical History:  Diagnosis Date  . Chronic back pain    "mostly lower; left foot is numb all the time" (11/22/2016)  . Chronic kidney disease    ?renal cyst on MRI   . GERD (gastroesophageal reflux disease)   . High cholesterol   . Lymphadenopathy    documented on chest CT scan 12/15/2005  . Substance abuse ([place])    tobacco   Review of Systems:   12 point ROS preformed. All negative aside from those mentioned in the HPI.  Physical Exam: Vitals:   08/06/17 1349 08/06/17 1351  BP: (!) 126/59   Pulse: 69   Temp: 97.7 F (36.5 C)   TempSrc: Oral   SpO2: 98% 98%  Weight: 203 lb 3.2 oz (92.2 kg)   Height: 5\' 7"  (1.702 m)    General: Well nourished female in no acute distress HENT: Normocephalic, atraumatic, moist mucus membranes  Pulm: Good air movement with no wheezing or crackles  CV: RRR, no murmurs, no rubs  Abdomen: Active bowel sounds, soft, non-distended, no tenderness to palpation  Extremities: Pulses palpable in the upper extremities bilaterally, no LE edema  - Right hand: 3 digit hyperpigmented papule that is tender to palpation  Skin: Warm and dry  Neuro: Alert and oriented  Assessment & Plan:   See Encounters Tab for problem based charting.  Patient seen with Dr. Dareen Piano

## 2017-08-06 NOTE — Assessment & Plan Note (Signed)
Patient presented with concerns of hyper pigmented, painful lesion on the medial side of her third digit around her PIP joint. She states that it has been present for approximately one month. She has no other lesions that are similar. It is painful to palpation. She has not tried anything for it. She has never had anything like this prior. She does have some household plans that she cares for me but does not have any roses. She denies systemic signs of infection.  On physical exam there is a hyper pigmented papule on the medial side of her third PIP that is tender to palpate. There is no central umbilicus.   Based on physical exam in history this is likely a plantar wart. She does have grandchildren that are an exposure risk. We discussed over-the-counter treatments and I have sent out salicylic acid. She would prefer to try over-the-counter removers prior to filling this prescription. She will come back if things do not improve in 4 to 6 weeks.

## 2017-08-06 NOTE — Patient Instructions (Signed)
It was a pleasure to meet you today. Please schedule a follow-up with your cardiologist as soon as possible to discuss your metoprolol. I have sent out a medication for your wart. Please do not hesitate to call if you have any issues. We will see you in 6 months or sooner if any issues arise.

## 2017-08-06 NOTE — Assessment & Plan Note (Signed)
Patient presented for continued management of her hypertension. She is currently on spironolactone 25 mg daily, metoprolol 25 mg daily, and amlodipine 5 mg daily. She states that the metoprolol was recently added by her cardiologist, Dr. Terrence Dupont. She states that since starting this medication she is felt SOB and fatigued. We discussed common side effects of beta blockers and I encouraged her to follow up with Dr. Terrence Dupont to see if she could be transitioned to a different medication. She voices understanding. She otherwise denies adverse effects due to the spironolactone or the amlodipine. She denies symptoms of orthostasis.  Plan: - Continue spironolactone, amlodipine, and metoprolol - Encouraged to follow up with cardiology as soon as possible

## 2017-08-07 NOTE — Progress Notes (Signed)
Internal Medicine Clinic Attending  I saw and evaluated the patient.  I personally confirmed the key portions of the history and exam documented by Dr. Helberg and I reviewed pertinent patient test results.  The assessment, diagnosis, and plan were formulated together and I agree with the documentation in the resident's note. 

## 2017-08-17 DIAGNOSIS — Z86010 Personal history of colonic polyps: Secondary | ICD-10-CM | POA: Diagnosis not present

## 2017-08-17 DIAGNOSIS — D126 Benign neoplasm of colon, unspecified: Secondary | ICD-10-CM | POA: Diagnosis not present

## 2017-08-21 DIAGNOSIS — D126 Benign neoplasm of colon, unspecified: Secondary | ICD-10-CM | POA: Diagnosis not present

## 2017-09-13 DIAGNOSIS — H401131 Primary open-angle glaucoma, bilateral, mild stage: Secondary | ICD-10-CM | POA: Diagnosis not present

## 2017-09-19 ENCOUNTER — Other Ambulatory Visit: Payer: Self-pay | Admitting: *Deleted

## 2017-09-19 MED ORDER — PRAVASTATIN SODIUM 40 MG PO TABS: 40.0000 mg | ORAL_TABLET | Freq: Every day | ORAL | 2 refills | Status: DC

## 2017-09-19 NOTE — Telephone Encounter (Signed)
Refilled

## 2017-09-28 DIAGNOSIS — I208 Other forms of angina pectoris: Secondary | ICD-10-CM | POA: Diagnosis not present

## 2017-09-28 DIAGNOSIS — R0609 Other forms of dyspnea: Secondary | ICD-10-CM | POA: Diagnosis not present

## 2017-09-28 DIAGNOSIS — K219 Gastro-esophageal reflux disease without esophagitis: Secondary | ICD-10-CM | POA: Diagnosis not present

## 2017-09-28 DIAGNOSIS — I10 Essential (primary) hypertension: Secondary | ICD-10-CM | POA: Diagnosis not present

## 2017-09-28 DIAGNOSIS — E785 Hyperlipidemia, unspecified: Secondary | ICD-10-CM | POA: Diagnosis not present

## 2017-10-08 ENCOUNTER — Other Ambulatory Visit: Payer: Self-pay | Admitting: *Deleted

## 2017-10-08 MED ORDER — GABAPENTIN 600 MG PO TABS: 600.0000 mg | ORAL_TABLET | Freq: Four times a day (QID) | ORAL | 2 refills | Status: DC

## 2017-10-08 NOTE — Telephone Encounter (Signed)
refilled 

## 2017-10-22 DIAGNOSIS — Z79899 Other long term (current) drug therapy: Secondary | ICD-10-CM | POA: Diagnosis not present

## 2017-10-22 DIAGNOSIS — G894 Chronic pain syndrome: Secondary | ICD-10-CM | POA: Diagnosis not present

## 2017-10-22 DIAGNOSIS — M961 Postlaminectomy syndrome, not elsewhere classified: Secondary | ICD-10-CM | POA: Diagnosis not present

## 2017-11-15 ENCOUNTER — Ambulatory Visit (INDEPENDENT_AMBULATORY_CARE_PROVIDER_SITE_OTHER): Payer: Medicare Other | Admitting: Internal Medicine

## 2017-11-15 VITALS — BP 136/70 | HR 103 | Temp 97.7°F | Wt 203.9 lb

## 2017-11-15 DIAGNOSIS — R252 Cramp and spasm: Secondary | ICD-10-CM

## 2017-11-15 DIAGNOSIS — H5203 Hypermetropia, bilateral: Secondary | ICD-10-CM | POA: Diagnosis not present

## 2017-11-15 DIAGNOSIS — K2100 Gastro-esophageal reflux disease with esophagitis, without bleeding: Secondary | ICD-10-CM

## 2017-11-15 DIAGNOSIS — Z560 Unemployment, unspecified: Secondary | ICD-10-CM | POA: Diagnosis not present

## 2017-11-15 DIAGNOSIS — R202 Paresthesia of skin: Secondary | ICD-10-CM | POA: Diagnosis not present

## 2017-11-15 DIAGNOSIS — H4010X0 Unspecified open-angle glaucoma, stage unspecified: Secondary | ICD-10-CM

## 2017-11-15 DIAGNOSIS — R200 Anesthesia of skin: Secondary | ICD-10-CM

## 2017-11-15 DIAGNOSIS — F17210 Nicotine dependence, cigarettes, uncomplicated: Secondary | ICD-10-CM

## 2017-11-15 DIAGNOSIS — R6889 Other general symptoms and signs: Secondary | ICD-10-CM | POA: Diagnosis not present

## 2017-11-15 DIAGNOSIS — Z79899 Other long term (current) drug therapy: Secondary | ICD-10-CM

## 2017-11-15 DIAGNOSIS — K219 Gastro-esophageal reflux disease without esophagitis: Secondary | ICD-10-CM

## 2017-11-15 DIAGNOSIS — N3946 Mixed incontinence: Secondary | ICD-10-CM

## 2017-11-15 DIAGNOSIS — K5900 Constipation, unspecified: Secondary | ICD-10-CM

## 2017-11-15 DIAGNOSIS — H16223 Keratoconjunctivitis sicca, not specified as Sjogren's, bilateral: Secondary | ICD-10-CM | POA: Diagnosis not present

## 2017-11-15 MED ORDER — RANITIDINE HCL 150 MG PO TABS: 150.0000 mg | ORAL_TABLET | Freq: Two times a day (BID) | ORAL | 2 refills | Status: DC | PRN

## 2017-11-15 MED ORDER — OXYBUTYNIN CHLORIDE 5 MG PO TABS: 5.0000 mg | ORAL_TABLET | Freq: Three times a day (TID) | ORAL | 0 refills | Status: DC

## 2017-11-15 NOTE — Progress Notes (Signed)
CC: hand cramping, urinary urgency, GERD  HPI:  [name] is a 72 y.o. female with PMH below.  She is here to address some hand cramping and her urinary symptoms.    Please see A&P for status of the patient's chronic medical conditions  Past Medical History:  Diagnosis Date  . Chronic back pain    "mostly lower; left foot is numb all the time" (11/22/2016)  . Chronic kidney disease    ?renal cyst on MRI   . GERD (gastroesophageal reflux disease)   . High cholesterol   . Lymphadenopathy    documented on chest CT scan 12/15/2005  . Substance abuse ([place])    tobacco   Review of Systems:  ROS: Pulmonary: pt denies increased work of breathing, shortness of breath,  Cardiac: pt denies palpitations, does endorse chest pain symptoms consistent with GERD  Abdominal: pt denies abdominal pain, nausea, vomiting, or diarrhea  Physical Exam:  Vitals:   11/15/17 1545  BP: 136/70  Pulse: (!) 103  Temp: 97.7 F (36.5 C)  TempSrc: Oral  SpO2: 100%  Weight: 203 lb 14.4 oz (92.5 kg)   Physical Exam  Constitutional: No distress.  Cardiovascular: Normal rate, regular rhythm and normal heart sounds. Exam reveals no gallop and no friction rub.  No murmur heard. Pulses:      Radial pulses are 2+ on the right side, and 2+ on the left side.  Pulmonary/Chest: Effort normal. No respiratory distress. She has no wheezes. She exhibits no tenderness.  Dry crackles heard in LLL  Abdominal: Soft. Bowel sounds are normal. She exhibits no distension and no mass. There is no tenderness. There is no rebound and no guarding.  Neurological: She is alert.  Preserved strength in bilateral hands, preserved sensation  Skin: She is not diaphoretic.    Social History   Socioeconomic History  . Marital status: Divorced    Spouse name: Not on file  . Number of children: 0  . Years of education: Not on file  . Highest education level: Not on file  Occupational History    Employer: UNEMPLOYED    Social Needs  . Financial resource strain: Not on file  . Food insecurity:    Worry: Not on file    Inability: Not on file  . Transportation needs:    Medical: Not on file    Non-medical: Not on file  Tobacco Use  . Smoking status: Current Every Day Smoker    Packs/day: 0.50    Years: 47.00    Pack years: 23.50    Types: Cigarettes  . Smokeless tobacco: Never Used  . Tobacco comment: 5-6cigs/day  Substance and Sexual Activity  . Alcohol use: Yes    Alcohol/week: 0.0 oz    Comment: 11/22/2016 "2 drinks q other holiday"  . Drug use: Yes    Types: Marijuana    Comment: 66/2018 "I stopped in the 1970s"  . Sexual activity: Never  Lifestyle  . Physical activity:    Days per week: Not on file    Minutes per session: Not on file  . Stress: Not on file  Relationships  . Social connections:    Talks on phone: Not on file    Gets together: Not on file    Attends religious service: Not on file    Active member of club or organization: Not on file    Attends meetings of clubs or organizations: Not on file    Relationship status: Not on file  .  Intimate partner violence:    Fear of current or ex partner: Not on file    Emotionally abused: Not on file    Physically abused: Not on file    Forced sexual activity: Not on file  Other Topics Concern  . Not on file  Social History Narrative   Patient's new Phone # [phone] and [phone].    Family History  Problem Relation Age of Onset  . Diabetes Father   . Hypertension Father     Assessment & Plan:   See Encounters Tab for problem based charting.  Patient discussed with Dr. Dareen Piano

## 2017-11-15 NOTE — Patient Instructions (Addendum)
Laura [name], please try the exercises below and a trial of the oxybutinin.  We will get back with you about the results of your lab tests to see if your electrolytes explain your hand cramping.  We may need to switch your laxative to prevent this in the future.  For your heartburn I have written you an as needed prescription for zantac.    Kegel Exercises Kegel exercises help strengthen the muscles that support the rectum, vagina, small intestine, bladder, and uterus. Doing Kegel exercises can help:  Improve bladder and bowel control.  Improve sexual response.  Reduce problems and discomfort during pregnancy.  Kegel exercises involve squeezing your pelvic floor muscles, which are the same muscles you squeeze when you try to stop the flow of urine. The exercises can be done while sitting, standing, or lying down, but it is best to vary your position. Phase 1 exercises 1. Squeeze your pelvic floor muscles tight. You should feel a tight lift in your rectal area. If you are a female, you should also feel a tightness in your vaginal area. Keep your stomach, buttocks, and legs relaxed. 2. Hold the muscles tight for up to 10 seconds. 3. Relax your muscles. Repeat this exercise 50 times a day or as many times as told by your health care provider. Continue to do this exercise for at least 4-6 weeks or for as long as told by your health care provider. This information is not intended to replace advice given to you by your health care provider. Make sure you discuss any questions you have with your health care provider. Document Released: 05/22/2012 Document Revised: 01/29/2016 Document Reviewed: 04/25/2015 Elsevier Interactive Patient Education  [employer].

## 2017-11-16 LAB — VITAMIN B12: VITAMIN B 12: 701 pg/mL (ref 232–1245)

## 2017-11-16 LAB — RENAL FUNCTION PANEL
Albumin: 3.9 g/dL (ref 3.6–4.8)
BUN / CREAT RATIO: 20 (ref 12–28)
BUN: 18 mg/dL (ref 8–27)
CO2: 19 mmol/L — ABNORMAL LOW (ref 20–29)
CREATININE: 0.92 mg/dL (ref 0.57–1.00)
Calcium: 8.9 mg/dL (ref 8.7–10.3)
Chloride: 108 mmol/L — ABNORMAL HIGH (ref 96–106)
GFR calc Af Amer: 73 mL/min/{1.73_m2} (ref >59)
GFR calc non Af Amer: 64 mL/min/{1.73_m2} (ref >59)
Glucose: 106 mg/dL — ABNORMAL HIGH (ref 65–99)
Phosphorus: 3.6 mg/dL (ref 2.5–4.5)
Potassium: 4.1 mmol/L (ref 3.5–5.2)
Sodium: 141 mmol/L (ref 134–144)

## 2017-11-16 LAB — MAGNESIUM: Magnesium: 2.2 mg/dL (ref 1.6–2.3)

## 2017-11-16 NOTE — Assessment & Plan Note (Signed)
Patient with history of GERD, recently has been having more symptoms of burning and chest discomfort after eating and when laying down.  She was previously on protonix daily with relief but doesn't like to be on daily medications if possible.  -ranitidine 150mg  BID PRN

## 2017-11-16 NOTE — Assessment & Plan Note (Signed)
Patient reports incontinence with coughing, sneezing etc.  She also has periods of intense urgency where suddenly she has to go and barely makes it to the restroom.  This seems to be a chronic issue for the patient.  She went to one referral with a urologist in the past and did not bring her records from them and for some reason never returned.  -will give trial of oxybutinin 5mg  TID cleared this with her ophthalmologist -gave pt information on kegel exercises to help strengthen pelvic floor

## 2017-11-16 NOTE — Assessment & Plan Note (Addendum)
Pt reports some mild occasional cramping in hands.  Does not have cervical disc disease that is known, no weakness or loss of sensation in hands on exam.  She takes magnesium citrate 1/2 bottle daily for constipation and I expect there may be some electrolyte abnormalities contributing.  No other medications she currently takes would be consistent with these symptoms. Does mention occasional tingling and numbness feeling.  Strong pulses in bilateral radial arteries.  -will check RFP and magnesium -also will check B12

## 2017-11-16 NOTE — Progress Notes (Signed)
Internal Medicine Clinic Attending  Case discussed with Dr. Winfrey  at the time of the visit.  We reviewed the resident's history and exam and pertinent patient test results.  I agree with the assessment, diagnosis, and plan of care documented in the resident's note.  

## 2017-11-16 NOTE — Assessment & Plan Note (Signed)
Called Dr. Venetia Maxon today pts ophthalmologist and spoke about starting the oxybutinin and ranitidine today to ensure it would not be contraindicated with the patient's glaucoma.  He confirmed that it is open angle and very early stage and that these medicines would not pose a problem.

## 2017-12-12 ENCOUNTER — Other Ambulatory Visit: Payer: Self-pay | Admitting: Internal Medicine

## 2017-12-12 DIAGNOSIS — N3946 Mixed incontinence: Secondary | ICD-10-CM

## 2017-12-12 NOTE — Telephone Encounter (Signed)
refilled 

## 2017-12-14 ENCOUNTER — Other Ambulatory Visit: Payer: Self-pay | Admitting: *Deleted

## 2017-12-14 MED ORDER — SPIRONOLACTONE 25 MG PO TABS: 25.0000 mg | ORAL_TABLET | Freq: Every day | ORAL | 1 refills | Status: DC

## 2017-12-14 NOTE — Telephone Encounter (Signed)
refilled 

## 2017-12-31 DIAGNOSIS — E785 Hyperlipidemia, unspecified: Secondary | ICD-10-CM | POA: Diagnosis not present

## 2017-12-31 DIAGNOSIS — I10 Essential (primary) hypertension: Secondary | ICD-10-CM | POA: Diagnosis not present

## 2017-12-31 DIAGNOSIS — I208 Other forms of angina pectoris: Secondary | ICD-10-CM | POA: Diagnosis not present

## 2017-12-31 DIAGNOSIS — K219 Gastro-esophageal reflux disease without esophagitis: Secondary | ICD-10-CM | POA: Diagnosis not present

## 2017-12-31 DIAGNOSIS — R0609 Other forms of dyspnea: Secondary | ICD-10-CM | POA: Diagnosis not present

## 2018-01-11 DIAGNOSIS — H2513 Age-related nuclear cataract, bilateral: Secondary | ICD-10-CM | POA: Diagnosis not present

## 2018-01-11 DIAGNOSIS — H40023 Open angle with borderline findings, high risk, bilateral: Secondary | ICD-10-CM | POA: Diagnosis not present

## 2018-01-11 DIAGNOSIS — H25013 Cortical age-related cataract, bilateral: Secondary | ICD-10-CM | POA: Diagnosis not present

## 2018-01-11 DIAGNOSIS — H04123 Dry eye syndrome of bilateral lacrimal glands: Secondary | ICD-10-CM | POA: Diagnosis not present

## 2018-01-15 DIAGNOSIS — R6889 Other general symptoms and signs: Secondary | ICD-10-CM

## 2018-01-15 HISTORY — DX: Other general symptoms and signs: R68.89

## 2018-01-15 NOTE — Assessment & Plan Note (Signed)
Pt reports some mild occasional cramping and numbness and tingling in hands.  Strong pulses in bilateral radial arteries.  -will order B12

## 2018-01-22 DIAGNOSIS — Z79891 Long term (current) use of opiate analgesic: Secondary | ICD-10-CM | POA: Diagnosis not present

## 2018-02-04 ENCOUNTER — Encounter: Payer: Self-pay | Admitting: Internal Medicine

## 2018-02-06 DIAGNOSIS — R0609 Other forms of dyspnea: Secondary | ICD-10-CM | POA: Diagnosis not present

## 2018-02-06 DIAGNOSIS — I208 Other forms of angina pectoris: Secondary | ICD-10-CM | POA: Diagnosis not present

## 2018-02-06 DIAGNOSIS — I10 Essential (primary) hypertension: Secondary | ICD-10-CM | POA: Diagnosis not present

## 2018-02-06 DIAGNOSIS — E785 Hyperlipidemia, unspecified: Secondary | ICD-10-CM | POA: Diagnosis not present

## 2018-03-11 ENCOUNTER — Other Ambulatory Visit: Payer: Self-pay

## 2018-03-11 ENCOUNTER — Ambulatory Visit (INDEPENDENT_AMBULATORY_CARE_PROVIDER_SITE_OTHER): Payer: Medicare Other | Admitting: Internal Medicine

## 2018-03-11 ENCOUNTER — Encounter: Payer: Self-pay | Admitting: Internal Medicine

## 2018-03-11 VITALS — BP 124/67 | HR 108 | Temp 98.0°F | Wt 200.6 lb

## 2018-03-11 DIAGNOSIS — E785 Hyperlipidemia, unspecified: Secondary | ICD-10-CM | POA: Diagnosis not present

## 2018-03-11 DIAGNOSIS — E7800 Pure hypercholesterolemia, unspecified: Secondary | ICD-10-CM

## 2018-03-11 DIAGNOSIS — I10 Essential (primary) hypertension: Secondary | ICD-10-CM | POA: Diagnosis not present

## 2018-03-11 DIAGNOSIS — K219 Gastro-esophageal reflux disease without esophagitis: Secondary | ICD-10-CM

## 2018-03-11 DIAGNOSIS — Z23 Encounter for immunization: Secondary | ICD-10-CM

## 2018-03-11 DIAGNOSIS — F17210 Nicotine dependence, cigarettes, uncomplicated: Secondary | ICD-10-CM

## 2018-03-11 DIAGNOSIS — Z9181 History of falling: Secondary | ICD-10-CM | POA: Insufficient documentation

## 2018-03-11 DIAGNOSIS — F17200 Nicotine dependence, unspecified, uncomplicated: Secondary | ICD-10-CM

## 2018-03-11 DIAGNOSIS — Z79899 Other long term (current) drug therapy: Secondary | ICD-10-CM

## 2018-03-11 MED ORDER — AMLODIPINE BESYLATE 10 MG PO TABS: 10.0000 mg | ORAL_TABLET | Freq: Every day | ORAL | 6 refills | Status: DC

## 2018-03-11 MED ORDER — ROSUVASTATIN CALCIUM 20 MG PO TABS
20.0000 mg | ORAL_TABLET | Freq: Every day | ORAL | 2 refills | Status: DC
Start: 2018-03-11 — End: 2018-12-03

## 2018-03-11 NOTE — Patient Instructions (Addendum)
Laura [name], keep up the good work.  We will discontinue your spironolactone 25mg  and increase your amlodipine to 10mg  daily.  We will stop your pravastatin 40mg  (cholesterol medicine) and switch it to rosuvastatin crestor 20mg  daily.  We also discussed your fall risk today.  Continue walking daily and keeping your strength up, wearing proper footwear and using your cane for assistance.  Please try and quit smoking as we discussed.  If the nicotine patches do not work for you please let me know and we can try another method.

## 2018-03-11 NOTE — Progress Notes (Signed)
CC: HTN, GERD, HLD  HPI:  [name] is a 72 y.o. female with PMH below.  Today we will address HTN, GERD, HLD.  Of note her heart rate fast on vitals walked straight to vitals chair.  Repeated later slowed down to low 80's during visit.   Please see A&P for status of the patient's chronic medical conditions  Past Medical History:  Diagnosis Date  . Chronic back pain    "mostly lower; left foot is numb all the time" (11/22/2016)  . Chronic kidney disease    ?renal cyst on MRI   . GERD (gastroesophageal reflux disease)   . High cholesterol   . Lymphadenopathy    documented on chest CT scan 12/15/2005  . Substance abuse ([place])    tobacco   Review of Systems:  ROS: Pulmonary: pt denies increased work of breathing, shortness of breath,  Cardiac: pt denies palpitations, chest pain,  Abdominal: pt denies abdominal pain, nausea, vomiting, or diarrhea  Physical Exam:  Vitals:   03/11/18 1514  BP: 124/67  Pulse: (!) 108  Temp: 98 F (36.7 C)  TempSrc: Oral  SpO2: 96%  Weight: 200 lb 9.6 oz (91 kg)   Cardiac: normal rate and rhythm, clear s1 and s2 Pulmonary: coarse breath sounds in bases, not in distress Abdominal: non distended abdomen, soft and nontender Extremities: no LE edema Mental status: Alert, conversant, in good spirits  Social History   Socioeconomic History  . Marital status: Divorced    Spouse name: Not on file  . Number of children: 0  . Years of education: Not on file  . Highest education level: Not on file  Occupational History    Employer: UNEMPLOYED  Social Needs  . Financial resource strain: Not on file  . Food insecurity:    Worry: Not on file    Inability: Not on file  . Transportation needs:    Medical: Not on file    Non-medical: Not on file  Tobacco Use  . Smoking status: Current Every Day Smoker    Packs/day: 0.50    Years: 47.00    Pack years: 23.50    Types: Cigarettes  . Smokeless tobacco: Never Used  . Tobacco comment:  4-5 cigs/day  Substance and Sexual Activity  . Alcohol use: Yes    Alcohol/week: 0.0 standard drinks    Comment: 11/22/2016 "2 drinks q other holiday"  . Drug use: Not Currently    Types: Marijuana    Comment: 66/2018 "I stopped in the 1970s"  . Sexual activity: Never  Lifestyle  . Physical activity:    Days per week: Not on file    Minutes per session: Not on file  . Stress: Not on file  Relationships  . Social connections:    Talks on phone: Not on file    Gets together: Not on file    Attends religious service: Not on file    Active member of club or organization: Not on file    Attends meetings of clubs or organizations: Not on file    Relationship status: Not on file  . Intimate partner violence:    Fear of current or ex partner: Not on file    Emotionally abused: Not on file    Physically abused: Not on file    Forced sexual activity: Not on file  Other Topics Concern  . Not on file  Social History Narrative   Patient's new Phone # [phone] and [phone].    Family  History  Problem Relation Age of Onset  . Diabetes Father   . Hypertension Father     Assessment & Plan:   See Encounters Tab for problem based charting.  Patient discussed with Dr. Angelia Mould

## 2018-03-12 NOTE — Progress Notes (Signed)
Internal Medicine Clinic Attending  Case discussed with Dr. Winfrey  at the time of the visit.  We reviewed the resident's history and exam and pertinent patient test results.  I agree with the assessment, diagnosis, and plan of care documented in the resident's note.  

## 2018-03-12 NOTE — Assessment & Plan Note (Signed)
Patient smoking about 5 cigarettes per day currently.  Has patches and chantix would rather use the patches, is afraid of chantix side effects.  We talked today about the importance of quitting.  She wants to give the patches a try and report how she did on them at our next visit.    -nicotine patches, we will see how she does

## 2018-03-12 NOTE — Assessment & Plan Note (Signed)
bp 124/67 today.  She is on amlodipine 5mg  and spiro 25mg .  I see no indication for spiro at this point in time.    -will increase amlodipine to 10mg  daily and d/c spironolactone

## 2018-03-12 NOTE — Assessment & Plan Note (Addendum)
Today we discussed the patient's risk for falls.  She is really not on many medications other than low dose oxybutinin currently that would put her at risk.  She has had no falls at home.  She does not have dizziness or unsteadiness on her feet.  I feel her risk at this time is low.  I encouraged her to continue and walk daily and keep her legs strong.  She uses a cane as well, mainly due to her back pain.  She wears tennis shoes avoids sandals and strapped shoes.    -encouraged continued exercise

## 2018-03-12 NOTE — Assessment & Plan Note (Signed)
Lab Results  Component Value Date   CHOL 123 11/23/2016   HDL 30 (L) 11/23/2016   LDLCALC 88 11/23/2016   TRIG 27 11/23/2016   CHOLHDL 4.1 11/23/2016   Pt currently on pravastatin 40mg , based on last lipid panel it is not doing the job.    -Patient reports no side effects on other statin drugs will switch her to rosuvastatin 20mg  daily.

## 2018-03-12 NOTE — Assessment & Plan Note (Signed)
Last visit I started pt on zantac 150mg  BID PRN.  Her symptoms didn't seem to warrant a PPI and she doesn't want to be on a daily medicine so I went with H2 blocker for more favorable side effect profile.  Today she reports she has used her ranitidine very rarely and her GERD symptoms are mild at best and very occasional.    -leave ranitidine PRN

## 2018-03-18 ENCOUNTER — Telehealth: Payer: Self-pay | Admitting: Internal Medicine

## 2018-03-18 NOTE — Telephone Encounter (Signed)
Pt is having problem with numbness in her foot, she is wondering it cause of the medicine change, pt contact [phone]

## 2018-03-20 ENCOUNTER — Telehealth: Payer: Self-pay | Admitting: Internal Medicine

## 2018-03-20 NOTE — Telephone Encounter (Signed)
Called patient and left a message for her doubt the medications would cause her numbness is very unusual.  Told her that if it persists she can have it checked out.

## 2018-03-20 NOTE — Telephone Encounter (Signed)
Left message about patient's foot numbness would be unusual for the medications I changed her to to be the cause.  Asked if it continues to be a problem to please call us back and we can take a look at it.

## 2018-04-01 DIAGNOSIS — E785 Hyperlipidemia, unspecified: Secondary | ICD-10-CM | POA: Diagnosis not present

## 2018-04-01 DIAGNOSIS — I10 Essential (primary) hypertension: Secondary | ICD-10-CM | POA: Diagnosis not present

## 2018-04-01 DIAGNOSIS — I519 Heart disease, unspecified: Secondary | ICD-10-CM | POA: Diagnosis not present

## 2018-04-01 DIAGNOSIS — I208 Other forms of angina pectoris: Secondary | ICD-10-CM | POA: Diagnosis not present

## 2018-04-01 DIAGNOSIS — K219 Gastro-esophageal reflux disease without esophagitis: Secondary | ICD-10-CM | POA: Diagnosis not present

## 2018-04-12 DIAGNOSIS — H04123 Dry eye syndrome of bilateral lacrimal glands: Secondary | ICD-10-CM | POA: Diagnosis not present

## 2018-04-12 DIAGNOSIS — H40023 Open angle with borderline findings, high risk, bilateral: Secondary | ICD-10-CM | POA: Diagnosis not present

## 2018-04-23 DIAGNOSIS — Z79899 Other long term (current) drug therapy: Secondary | ICD-10-CM | POA: Diagnosis not present

## 2018-04-23 DIAGNOSIS — Z79891 Long term (current) use of opiate analgesic: Secondary | ICD-10-CM | POA: Diagnosis not present

## 2018-04-23 DIAGNOSIS — Z5181 Encounter for therapeutic drug level monitoring: Secondary | ICD-10-CM | POA: Diagnosis not present

## 2018-04-29 ENCOUNTER — Encounter: Payer: Self-pay | Admitting: Internal Medicine

## 2018-04-29 NOTE — Progress Notes (Deleted)
   CC: ***  HPI:  [name] is a 72 y.o. female with PMH below.  Today we will address ***  Please see A&P for status of the patient's chronic medical conditions  Past Medical History:  Diagnosis Date  . Chronic back pain    "mostly lower; left foot is numb all the time" (11/22/2016)  . Chronic kidney disease    ?renal cyst on MRI   . GERD (gastroesophageal reflux disease)   . High cholesterol   . Lymphadenopathy    documented on chest CT scan 12/15/2005  . Substance abuse ([place])    tobacco   Review of Systems:  ***  Physical Exam:  There were no vitals filed for this visit. ***  Social History   Socioeconomic History  . Marital status: Divorced    Spouse name: Not on file  . Number of children: 0  . Years of education: Not on file  . Highest education level: Not on file  Occupational History    Employer: UNEMPLOYED  Social Needs  . Financial resource strain: Not on file  . Food insecurity:    Worry: Not on file    Inability: Not on file  . Transportation needs:    Medical: Not on file    Non-medical: Not on file  Tobacco Use  . Smoking status: Current Every Day Smoker    Packs/day: 0.50    Years: 47.00    Pack years: 23.50    Types: Cigarettes  . Smokeless tobacco: Never Used  . Tobacco comment: 4-5 cigs/day  Substance and Sexual Activity  . Alcohol use: Yes    Alcohol/week: 0.0 standard drinks    Comment: 11/22/2016 "2 drinks q other holiday"  . Drug use: Not Currently    Types: Marijuana    Comment: 66/2018 "I stopped in the 1970s"  . Sexual activity: Never  Lifestyle  . Physical activity:    Days per week: Not on file    Minutes per session: Not on file  . Stress: Not on file  Relationships  . Social connections:    Talks on phone: Not on file    Gets together: Not on file    Attends religious service: Not on file    Active member of club or organization: Not on file    Attends meetings of clubs or organizations: Not on file   Relationship status: Not on file  . Intimate partner violence:    Fear of current or ex partner: Not on file    Emotionally abused: Not on file    Physically abused: Not on file    Forced sexual activity: Not on file  Other Topics Concern  . Not on file  Social History Narrative   Patient's new Phone # [phone] and [phone].   *** Family History  Problem Relation Age of Onset  . Diabetes Father   . Hypertension Father     Assessment & Plan:   See Encounters Tab for problem based charting.  Patient {GC/GE:3044014::"discussed with","seen with"} Dr. {NAMES:[id number]::"Butcher","Granfortuna","E. Hoffman","Klima","Mullen","Narendra","Raines","Vincent"}

## 2018-05-06 DIAGNOSIS — Z79899 Other long term (current) drug therapy: Secondary | ICD-10-CM | POA: Diagnosis not present

## 2018-05-06 DIAGNOSIS — Z5181 Encounter for therapeutic drug level monitoring: Secondary | ICD-10-CM | POA: Diagnosis not present

## 2018-05-20 DIAGNOSIS — Z79899 Other long term (current) drug therapy: Secondary | ICD-10-CM | POA: Diagnosis not present

## 2018-05-20 DIAGNOSIS — M545 Low back pain: Secondary | ICD-10-CM | POA: Diagnosis not present

## 2018-05-20 DIAGNOSIS — Z5181 Encounter for therapeutic drug level monitoring: Secondary | ICD-10-CM | POA: Diagnosis not present

## 2018-06-02 IMAGING — CR DG CHEST 2V
2 series · 2 of 2 positions shown · non-contrast
Comparison: 08/04/2013, 05/19/2008

CLINICAL DATA: Left-sided chest pain for 2 weeks

EXAM:
CHEST  2 VIEW

[chest lat]
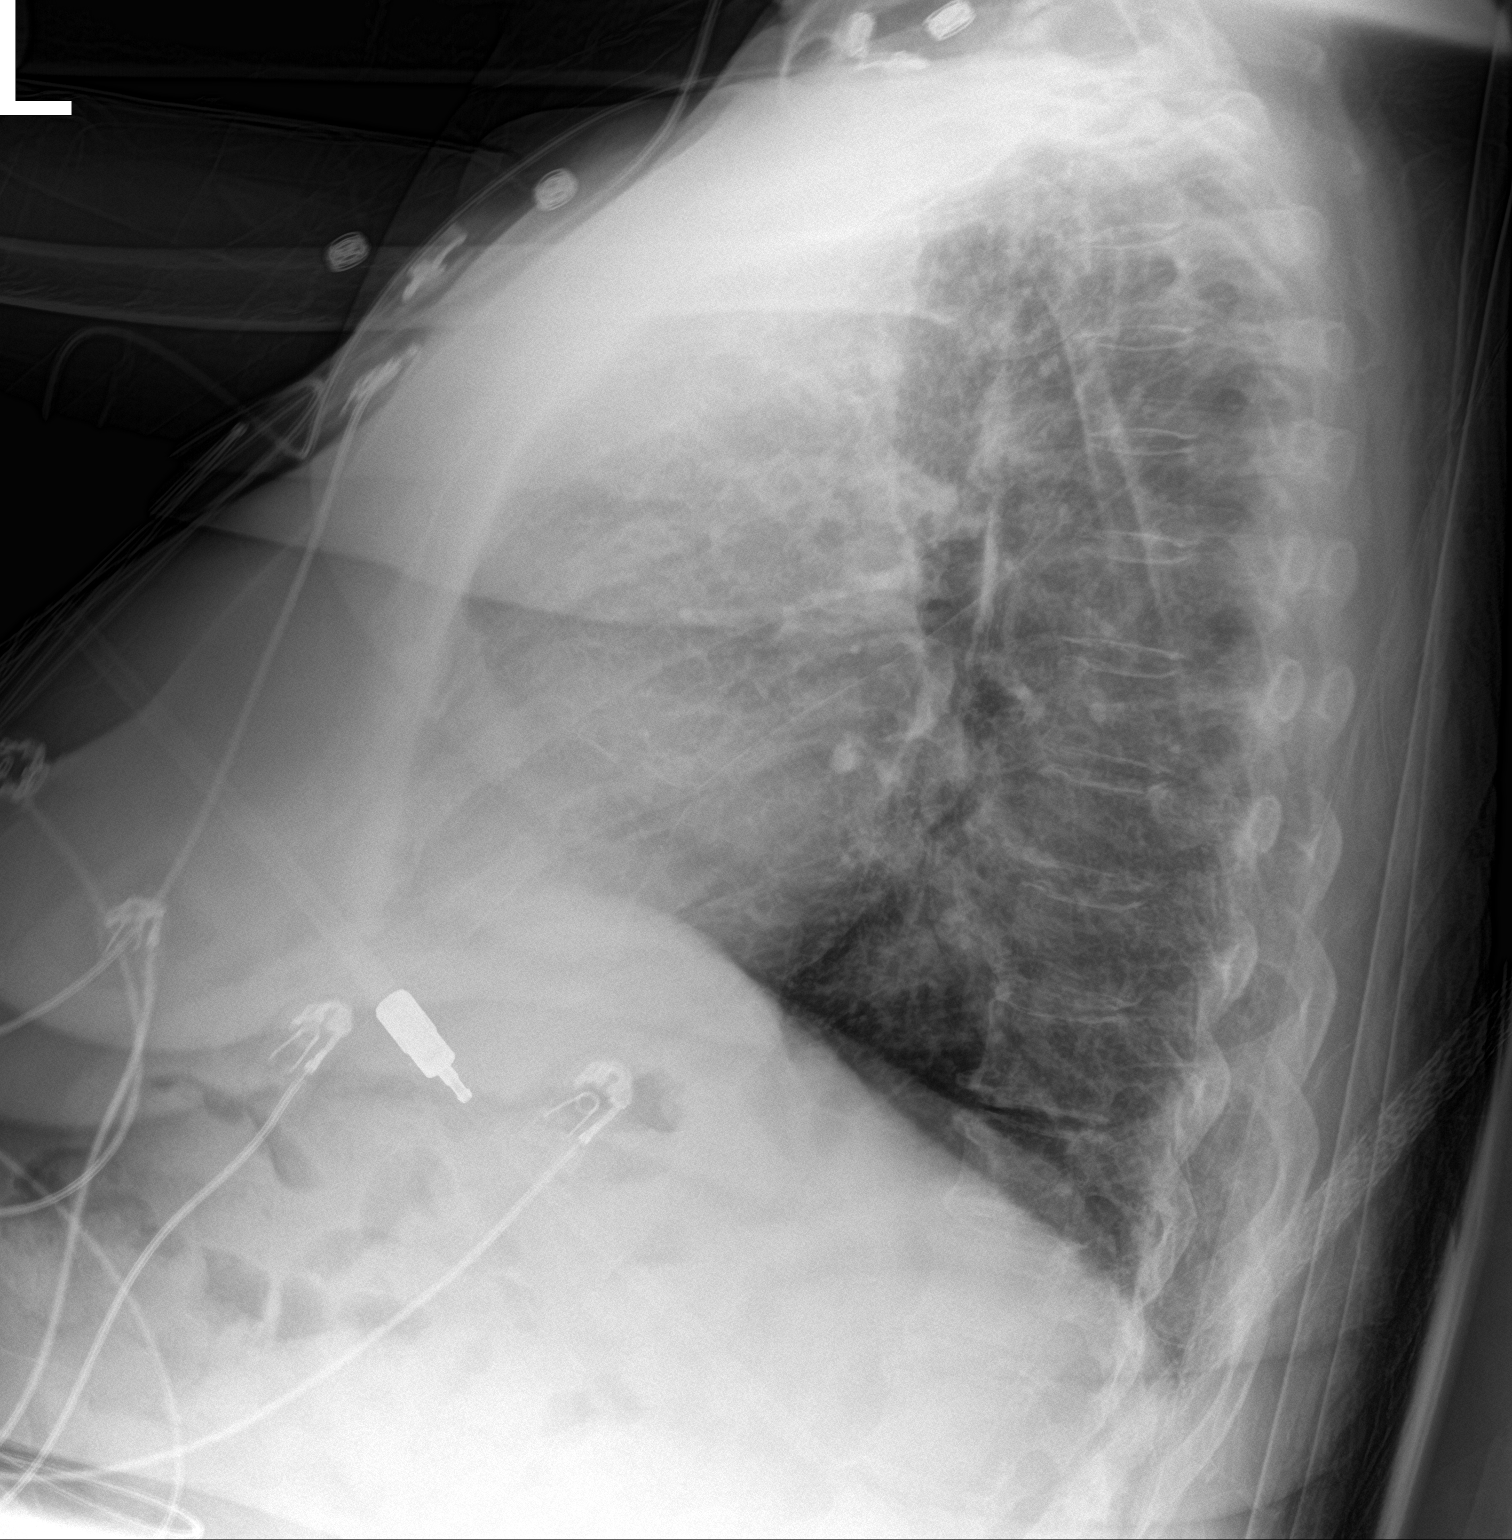

[chest ap]
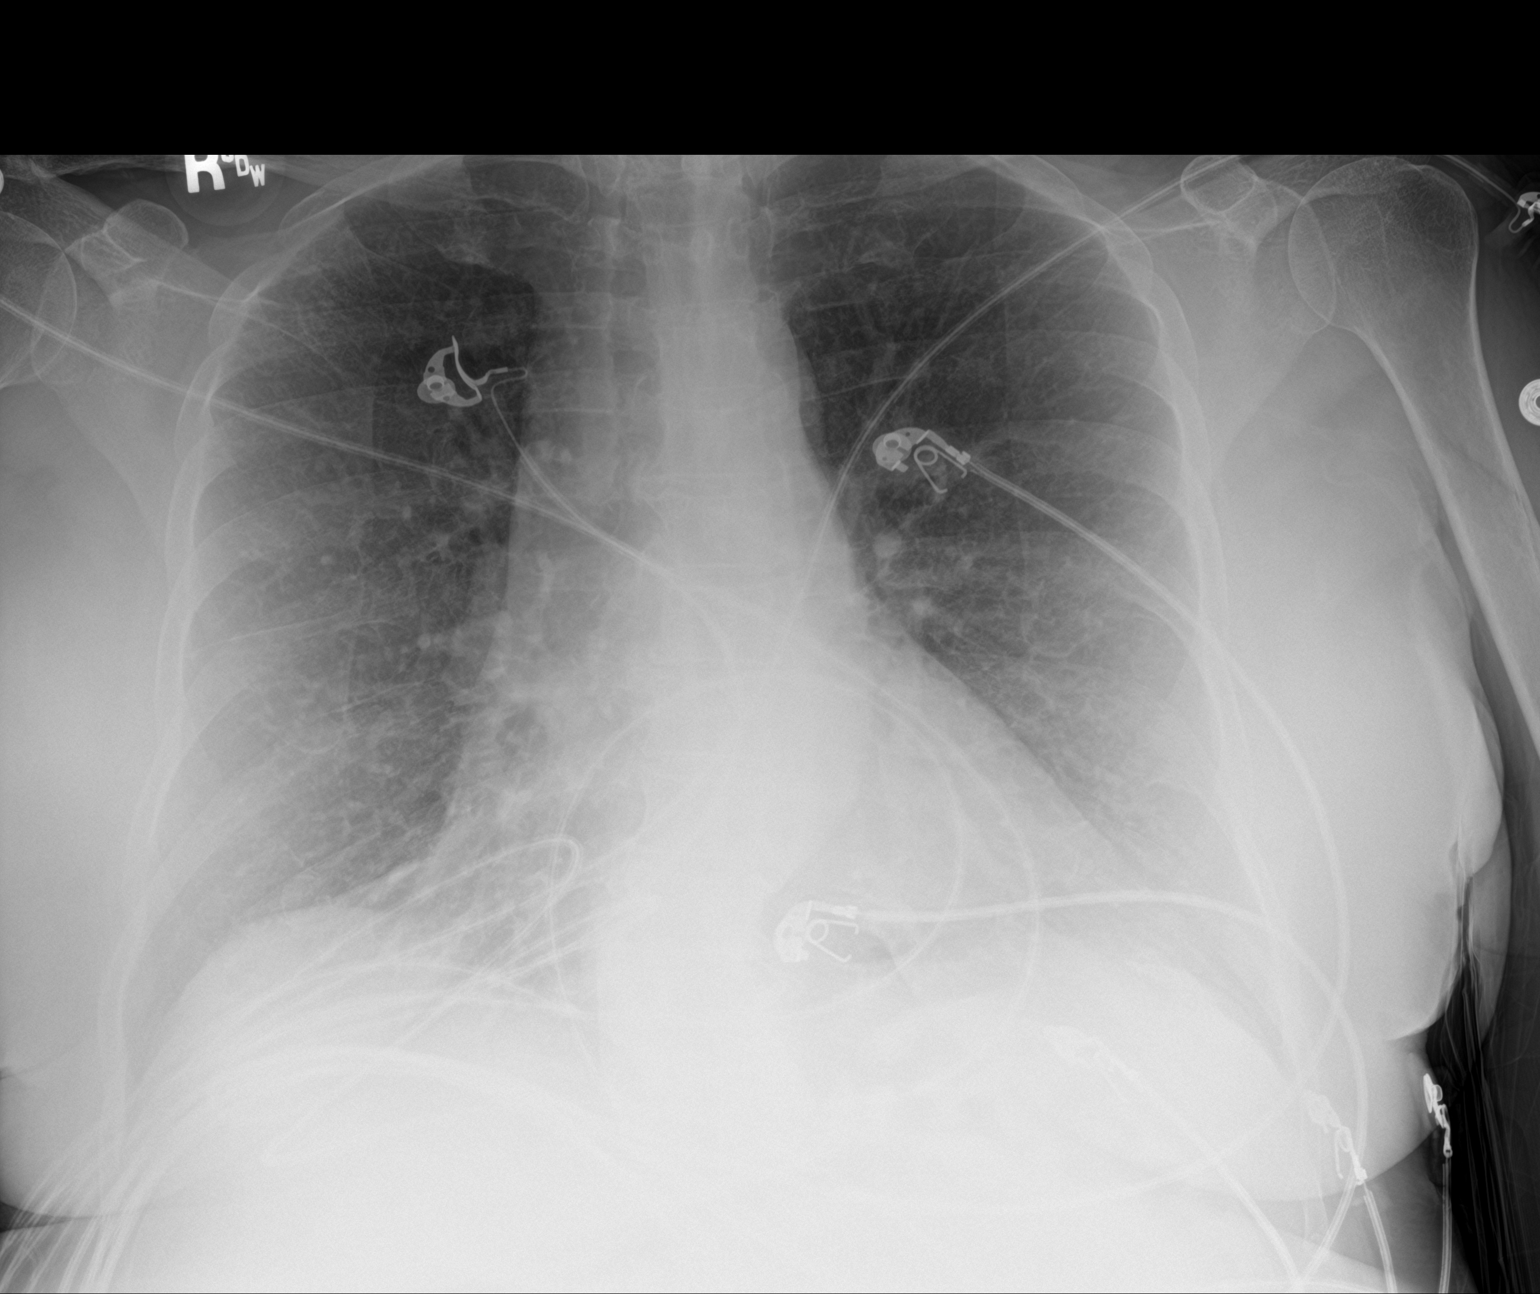

[2 of 2 positions shown; findings below may reference images not displayed]

FINDINGS: Hyperinflation. No focal infiltrate or effusion. Borderline
cardiomegaly. No edema. Mildly tortuous aorta dog. No pneumothorax.
Mild degenerative changes.
IMPRESSION: Borderline cardiomegaly.  No acute infiltrate or edema.

## 2018-06-14 ENCOUNTER — Other Ambulatory Visit: Payer: Self-pay

## 2018-06-14 MED ORDER — TIZANIDINE HCL 4 MG PO TABS
4.0000 mg | ORAL_TABLET | Freq: Three times a day (TID) | ORAL | 0 refills | Status: DC | PRN
Start: 2018-06-14 — End: 2021-05-25

## 2018-06-14 NOTE — Telephone Encounter (Addendum)
tiZANidine (ZANAFLEX) 4 MG tablet   [facility] @ [facility] [address].

## 2018-06-14 NOTE — Telephone Encounter (Signed)
refilled 

## 2018-06-17 ENCOUNTER — Other Ambulatory Visit: Payer: Self-pay

## 2018-06-17 NOTE — Telephone Encounter (Signed)
Seems very odd [facility], could you call about this, if it cannot please cancel the Rx at her old pharmacy and I can Rx it to the new one.

## 2018-06-17 NOTE — Telephone Encounter (Signed)
Please call pt back about med.  

## 2018-06-17 NOTE — Telephone Encounter (Signed)
[facility] [address] [facility] [address]. [facility] office tried to explain to pt she can have rx transferred but stated this does not work. I will re-send request back to the doctor.

## 2018-06-17 NOTE — Telephone Encounter (Signed)
[facility], [facility] [address]; pt called / informed.

## 2018-07-17 ENCOUNTER — Ambulatory Visit: Payer: Self-pay

## 2018-07-18 ENCOUNTER — Ambulatory Visit (HOSPITAL_COMMUNITY)
Admission: RE | Admit: 2018-07-18 | Discharge: 2018-07-18 | Disposition: A | Discharge status: Home / Self Care | Payer: Medicare Other | Source: Ambulatory Visit | Attending: Oncology | Admitting: Oncology

## 2018-07-18 ENCOUNTER — Other Ambulatory Visit: Payer: Self-pay

## 2018-07-18 ENCOUNTER — Encounter: Payer: Self-pay | Admitting: Internal Medicine

## 2018-07-18 ENCOUNTER — Ambulatory Visit (INDEPENDENT_AMBULATORY_CARE_PROVIDER_SITE_OTHER): Payer: Medicare Other | Admitting: Internal Medicine

## 2018-07-18 VITALS — BP 128/72 | HR 91 | Temp 98.1°F | Ht 64.0 in | Wt 200.0 lb

## 2018-07-18 DIAGNOSIS — J189 Pneumonia, unspecified organism: Secondary | ICD-10-CM | POA: Diagnosis not present

## 2018-07-18 DIAGNOSIS — R5383 Other fatigue: Secondary | ICD-10-CM | POA: Diagnosis not present

## 2018-07-18 DIAGNOSIS — R05 Cough: Secondary | ICD-10-CM

## 2018-07-18 DIAGNOSIS — R509 Fever, unspecified: Secondary | ICD-10-CM | POA: Diagnosis not present

## 2018-07-18 MED ORDER — AMOXICILLIN-POT CLAVULANATE 875-125 MG PO TABS: 1.0000 | ORAL_TABLET | Freq: Two times a day (BID) | ORAL | 0 refills | Status: AC

## 2018-07-18 MED ORDER — AZITHROMYCIN 250 MG PO TABS
ORAL_TABLET | ORAL | 0 refills | Status: DC
Start: 2018-07-18 — End: 2019-11-29

## 2018-07-18 NOTE — Progress Notes (Signed)
   CC: cough and fatigue   HPI:Ms.SHIRLEY DECAMP is a 73 y.o. female who presents for evaluation of cough, fever, chills, fatigue, for 6 days. Please see individual problem based A/P for details.  PHQ-9: Based on the patients score of 4 we have decided to monitor as this is attributable to her acute illness.  Past Medical History:  Diagnosis Date  . Chronic back pain    "mostly lower; left foot is numb all the time" (11/22/2016)  . Chronic kidney disease    ?renal cyst on MRI   . GERD (gastroesophageal reflux disease)   . High cholesterol   . Lymphadenopathy    documented on chest CT scan 12/15/2005  . Substance abuse ([place])    tobacco   Review of Systems:  ROS negative except as per HPI.  Physical Exam: Vitals:   07/18/18 0905  BP: 128/72  Pulse: 91  Temp: 98.1 F (36.7 C)  TempSrc: Oral  SpO2: 98%  Weight: 200 lb (90.7 kg)  Height: 5\' 4"  (1.626 m)   General: A/O x4, in no acute distress, afebrile, nondiaphoretic HEENT: PEERL, CNII-XII grossly intact, EMO intact Cardio: RRR, no mrg's Pulmonary: Prominent wheezing and rhonchi in the right lung fields greater than left, good air flow, no focal diminished breath sounds.  MSK: BLE nontender, nonedematous  Assessment & Plan:   See Encounters Tab for problem based charting.  Patient discussed with Dr. Beryle Beams

## 2018-07-18 NOTE — Patient Instructions (Addendum)
FOLLOW-UP INSTRUCTIONS When: If your symptoms worsen or fail to improve What to bring: All of your medications  I have added two antibiotics to your medications today to treat you for community acquired pneumonia. Please complete the five day course regardless of if you feel better or not. This is important.   It is also very important that you remain hydrated, drinking enough water that your urine is a normal light yellow to clear. Please drink water in addition to the soda. Please also try to eat any food that is tolerable to you.  As always if your symptoms worsen, fail to improve, or you develop other concerning symptoms, please notify our office or visit the local ER if we are unavailable. Symptoms including high fever, worsening chills, chest pain, coughing up blood, the feeling that you can't catch your breath, should not be ignored and should encourage you to visit the ED if we are unavailable by phone or the symptoms are severe.  Thank you for your visit to the [facility] today. If you have any questions or concerns please call us at [phone].

## 2018-07-18 NOTE — Progress Notes (Signed)
Medicine attending: Medical history, presenting problems, physical findings, and medications, reviewed with resident physician Dr Lawrence Harbrecht on the day of the patient visit and I concur with his evaluation and management plan. 

## 2018-07-18 NOTE — Assessment & Plan Note (Signed)
Suspected CAP: Patient presented with an initially productive cough, fever, chills, fatigue, decreased PO intake, decreased urinary output, wheezing, dyspnea with exertion and malaise for 6 days absent improvement. Vitals stable, SpO2 98% on room air, Pulse 91, BP 128/72. Absent use of accessory muscles, patient is able to speak in complete sentences without pausing. She appears generally tired, but is able to ambulate and not in acute distress. Given her presentation today, lack of particular comorbidities and the 7 day a week caretaker at home I feel she is a candidate for home CAP treatment although this is likely a bronchial viral process I can not say for certainty. Given her age, empiric therapy is warranted.  Plan: Augmentin and Azithromycin x5 days CXR PA and lateral STAT was unremarkable for a focal process, mass or effusion.  Given strict return precautions Advised to remain hydrated and to eat as much as she can tolerate

## 2018-07-31 DIAGNOSIS — I208 Other forms of angina pectoris: Secondary | ICD-10-CM | POA: Diagnosis not present

## 2018-07-31 DIAGNOSIS — K219 Gastro-esophageal reflux disease without esophagitis: Secondary | ICD-10-CM | POA: Diagnosis not present

## 2018-07-31 DIAGNOSIS — I10 Essential (primary) hypertension: Secondary | ICD-10-CM | POA: Diagnosis not present

## 2018-07-31 DIAGNOSIS — E785 Hyperlipidemia, unspecified: Secondary | ICD-10-CM | POA: Diagnosis not present

## 2018-07-31 DIAGNOSIS — I5030 Unspecified diastolic (congestive) heart failure: Secondary | ICD-10-CM | POA: Diagnosis not present

## 2018-08-02 DIAGNOSIS — E785 Hyperlipidemia, unspecified: Secondary | ICD-10-CM | POA: Diagnosis not present

## 2018-08-02 DIAGNOSIS — I10 Essential (primary) hypertension: Secondary | ICD-10-CM | POA: Diagnosis not present

## 2018-08-02 DIAGNOSIS — M48061 Spinal stenosis, lumbar region without neurogenic claudication: Secondary | ICD-10-CM | POA: Diagnosis not present

## 2018-08-05 ENCOUNTER — Ambulatory Visit (INDEPENDENT_AMBULATORY_CARE_PROVIDER_SITE_OTHER): Payer: Medicare Other | Admitting: Internal Medicine

## 2018-08-05 ENCOUNTER — Encounter: Payer: Self-pay | Admitting: Internal Medicine

## 2018-08-05 ENCOUNTER — Other Ambulatory Visit: Payer: Self-pay

## 2018-08-05 VITALS — BP 116/73 | HR 87 | Temp 97.6°F

## 2018-08-05 DIAGNOSIS — Z1239 Encounter for other screening for malignant neoplasm of breast: Secondary | ICD-10-CM

## 2018-08-05 DIAGNOSIS — Z720 Tobacco use: Secondary | ICD-10-CM | POA: Diagnosis not present

## 2018-08-05 DIAGNOSIS — J309 Allergic rhinitis, unspecified: Secondary | ICD-10-CM

## 2018-08-05 DIAGNOSIS — R0981 Nasal congestion: Secondary | ICD-10-CM

## 2018-08-05 DIAGNOSIS — F17200 Nicotine dependence, unspecified, uncomplicated: Secondary | ICD-10-CM

## 2018-08-05 DIAGNOSIS — Z79899 Other long term (current) drug therapy: Secondary | ICD-10-CM | POA: Diagnosis not present

## 2018-08-05 MED ORDER — FLUTICASONE PROPIONATE 50 MCG/ACT NA SUSP: 1.0000 | Freq: Every day | NASAL | 2 refills | Status: DC

## 2018-08-05 MED ORDER — CETIRIZINE HCL 10 MG PO TABS
10.0000 mg | ORAL_TABLET | Freq: Every day | ORAL | 2 refills | Status: DC
Start: 2018-08-05 — End: 2018-10-29

## 2018-08-05 NOTE — Patient Instructions (Addendum)
Laura [name] I have written for some medicines to help with your sinus congestion.  Also please pick up the nasal irrigation device that we looked at together to help clear out your sinuses.  Can consider stopping your daily aspirin as we discussed.  Continue to cut back on the smoking.  We will be here for you if you need [place] otherwise you can schedule follow-up appointment with me for 6 months from now.

## 2018-08-05 NOTE — Progress Notes (Signed)
CC: sinus congestion, Tobacco use  HPI:  [name] is a 73 y.o. female with PMH below.  Today we will address sinus congestion,  Tobacco use  Please see A&P for status of the patient's chronic medical conditions  Past Medical History:  Diagnosis Date  . Chronic back pain    "mostly lower; left foot is numb all the time" (11/22/2016)  . Chronic kidney disease    ?renal cyst on MRI   . GERD (gastroesophageal reflux disease)   . High cholesterol   . Lymphadenopathy    documented on chest CT scan 12/15/2005  . Substance abuse ([place])    tobacco   Review of Systems:  ROS: Pulmonary: pt denies increased work of breathing, shortness of breath,  Cardiac: pt denies palpitations, chest pain,  Abdominal: pt denies abdominal pain, nausea, vomiting, or diarrhea   Physical Exam:  Vitals:   08/05/18 1556  BP: 116/73  Pulse: 87  Temp: 97.6 F (36.4 C)  TempSrc: Oral  SpO2: 97%   Cardiac: normal rate and rhythm, clear s1 and s2, no murmurs, rubs or gallops Pulmonary: CTAB, not in distress Abdominal: non distended abdomen, soft and nontender Extremities: no LE edema Psych: Alert, conversant, in good spirits Nose: Inflamed nasal turbinates seen bilaterally, sinus drainage   Social History   Socioeconomic History  . Marital status: Divorced    Spouse name: Not on file  . Number of children: 0  . Years of education: Not on file  . Highest education level: Not on file  Occupational History    Employer: UNEMPLOYED  Social Needs  . Financial resource strain: Not on file  . Food insecurity:    Worry: Not on file    Inability: Not on file  . Transportation needs:    Medical: Not on file    Non-medical: Not on file  Tobacco Use  . Smoking status: Current Every Day Smoker    Packs/day: 0.50    Years: 47.00    Pack years: 23.50    Types: Cigarettes  . Smokeless tobacco: Never Used  . Tobacco comment: 4-5 cigs/day  Substance and Sexual Activity  . Alcohol use: Yes      Alcohol/week: 0.0 standard drinks    Comment: 11/22/2016 "2 drinks q other holiday"  . Drug use: Not Currently    Types: Marijuana    Comment: 66/2018 "I stopped in the 1970s"  . Sexual activity: Never  Lifestyle  . Physical activity:    Days per week: Not on file    Minutes per session: Not on file  . Stress: Not on file  Relationships  . Social connections:    Talks on phone: Not on file    Gets together: Not on file    Attends religious service: Not on file    Active member of club or organization: Not on file    Attends meetings of clubs or organizations: Not on file    Relationship status: Not on file  . Intimate partner violence:    Fear of current or ex partner: Not on file    Emotionally abused: Not on file    Physically abused: Not on file    Forced sexual activity: Not on file  Other Topics Concern  . Not on file  Social History Narrative   Patient's new Phone # [phone] and [phone].    Family History  Problem Relation Age of Onset  . Diabetes Father   . Hypertension Father  Assessment & Plan:   See Encounters Tab for problem based charting.  Patient discussed with Dr. Evette Doffing

## 2018-08-05 NOTE — Assessment & Plan Note (Signed)
No fevers or chills, no muscle aches.  No N/V.  Patient really describes no systemic symptoms.  Her sinus drainage has continued to drain clear.  She was seen in [facility] clinic a few months back and received a Z-Pak and Augmentin for possible pneumonia.  She denies any respiratory symptoms today she has no cough.  Her lungs are clear.  Her nasal turbinates are inflamed and she describes drainage and congestive symptoms.    -Prescribed Flonase and Zyrtec -Showed patient nasal irrigation strategies and devices available over-the-counter.  She was apprehensive about using these however you to give the less cumbersome devices a try

## 2018-08-05 NOTE — Assessment & Plan Note (Signed)
We spent some time talking about her smoking today.  She tells me she is down to about 5 to 6 cigarettes/day.  She was prescribed Chantix in the past but was afraid to take it due to the side effects.  I tried to revisit this a little today however she is not willing to consider it.  She also has patches and gum at home that she uses on an intermittent basis.  She said the real way for her to quit his cold [place] she has done so for several stretches of a few months at a time her goal is to stop smoking before the summer.    -Continue to encourage smoking cessation

## 2018-08-06 NOTE — Progress Notes (Signed)
Internal Medicine Clinic Attending  Case discussed with Dr. Winfrey  at the time of the visit.  We reviewed the resident's history and exam and pertinent patient test results.  I agree with the assessment, diagnosis, and plan of care documented in the resident's note.  

## 2018-08-19 DIAGNOSIS — H40051 Ocular hypertension, right eye: Secondary | ICD-10-CM | POA: Diagnosis not present

## 2018-08-19 DIAGNOSIS — H04123 Dry eye syndrome of bilateral lacrimal glands: Secondary | ICD-10-CM | POA: Diagnosis not present

## 2018-08-19 DIAGNOSIS — H40023 Open angle with borderline findings, high risk, bilateral: Secondary | ICD-10-CM | POA: Diagnosis not present

## 2018-08-23 DIAGNOSIS — M5137 Other intervertebral disc degeneration, lumbosacral region: Secondary | ICD-10-CM | POA: Diagnosis not present

## 2018-08-24 ENCOUNTER — Other Ambulatory Visit: Payer: Self-pay | Admitting: Internal Medicine

## 2018-08-26 NOTE — Telephone Encounter (Signed)
refilled 

## 2018-08-30 ENCOUNTER — Ambulatory Visit: Payer: Self-pay | Admitting: Internal Medicine

## 2018-08-30 ENCOUNTER — Encounter: Payer: Self-pay | Admitting: Internal Medicine

## 2018-10-28 ENCOUNTER — Other Ambulatory Visit: Payer: Self-pay | Admitting: Internal Medicine

## 2018-10-28 DIAGNOSIS — R0981 Nasal congestion: Secondary | ICD-10-CM

## 2018-10-29 NOTE — Telephone Encounter (Signed)
refilled 

## 2018-10-30 ENCOUNTER — Other Ambulatory Visit: Payer: Self-pay

## 2018-10-30 NOTE — Telephone Encounter (Signed)
Called pt - informed Zyrtec rx was sent to [facility] yesterday.

## 2018-10-30 NOTE — Telephone Encounter (Signed)
cetirizine (ZYRTEC) 10 MG tablet, refill request @  [id number] - [place], [place] [address] [phone] (Phone) [phone] (Fax)

## 2018-11-01 ENCOUNTER — Other Ambulatory Visit: Payer: Self-pay | Admitting: Internal Medicine

## 2018-11-01 DIAGNOSIS — R0981 Nasal congestion: Secondary | ICD-10-CM

## 2018-11-01 NOTE — Telephone Encounter (Signed)
refilled 

## 2018-11-05 DIAGNOSIS — M47896 Other spondylosis, lumbar region: Secondary | ICD-10-CM | POA: Diagnosis not present

## 2018-11-05 DIAGNOSIS — Z79891 Long term (current) use of opiate analgesic: Secondary | ICD-10-CM | POA: Diagnosis not present

## 2018-11-20 ENCOUNTER — Inpatient Hospital Stay: Admission: RE | Admit: 2018-11-20 | Payer: Self-pay | Source: Ambulatory Visit

## 2018-11-25 DIAGNOSIS — K219 Gastro-esophageal reflux disease without esophagitis: Secondary | ICD-10-CM | POA: Diagnosis not present

## 2018-11-25 DIAGNOSIS — I208 Other forms of angina pectoris: Secondary | ICD-10-CM | POA: Diagnosis not present

## 2018-11-25 DIAGNOSIS — I5030 Unspecified diastolic (congestive) heart failure: Secondary | ICD-10-CM | POA: Diagnosis not present

## 2018-11-25 DIAGNOSIS — I10 Essential (primary) hypertension: Secondary | ICD-10-CM | POA: Diagnosis not present

## 2018-11-25 DIAGNOSIS — E785 Hyperlipidemia, unspecified: Secondary | ICD-10-CM | POA: Diagnosis not present

## 2018-11-29 DIAGNOSIS — H40051 Ocular hypertension, right eye: Secondary | ICD-10-CM | POA: Diagnosis not present

## 2018-11-29 DIAGNOSIS — H40013 Open angle with borderline findings, low risk, bilateral: Secondary | ICD-10-CM | POA: Diagnosis not present

## 2018-11-29 DIAGNOSIS — H04123 Dry eye syndrome of bilateral lacrimal glands: Secondary | ICD-10-CM | POA: Diagnosis not present

## 2018-12-03 ENCOUNTER — Other Ambulatory Visit: Payer: Self-pay | Admitting: Internal Medicine

## 2018-12-03 DIAGNOSIS — E7800 Pure hypercholesterolemia, unspecified: Secondary | ICD-10-CM

## 2018-12-03 NOTE — Telephone Encounter (Signed)
refilled 

## 2019-01-20 ENCOUNTER — Other Ambulatory Visit: Payer: Self-pay | Admitting: Internal Medicine

## 2019-01-20 DIAGNOSIS — R0981 Nasal congestion: Secondary | ICD-10-CM

## 2019-02-06 DIAGNOSIS — H524 Presbyopia: Secondary | ICD-10-CM | POA: Diagnosis not present

## 2019-02-06 DIAGNOSIS — H2513 Age-related nuclear cataract, bilateral: Secondary | ICD-10-CM | POA: Diagnosis not present

## 2019-02-06 DIAGNOSIS — H40051 Ocular hypertension, right eye: Secondary | ICD-10-CM | POA: Diagnosis not present

## 2019-02-06 DIAGNOSIS — H40013 Open angle with borderline findings, low risk, bilateral: Secondary | ICD-10-CM | POA: Diagnosis not present

## 2019-02-06 DIAGNOSIS — H04123 Dry eye syndrome of bilateral lacrimal glands: Secondary | ICD-10-CM | POA: Diagnosis not present

## 2019-02-10 ENCOUNTER — Encounter: Payer: Medicare Other | Admitting: Internal Medicine

## 2019-02-10 DIAGNOSIS — I10 Essential (primary) hypertension: Secondary | ICD-10-CM | POA: Diagnosis not present

## 2019-02-10 DIAGNOSIS — E785 Hyperlipidemia, unspecified: Secondary | ICD-10-CM | POA: Diagnosis not present

## 2019-02-10 DIAGNOSIS — K219 Gastro-esophageal reflux disease without esophagitis: Secondary | ICD-10-CM | POA: Diagnosis not present

## 2019-02-10 DIAGNOSIS — M79602 Pain in left arm: Secondary | ICD-10-CM | POA: Diagnosis not present

## 2019-02-10 DIAGNOSIS — R0609 Other forms of dyspnea: Secondary | ICD-10-CM | POA: Diagnosis not present

## 2019-02-11 DIAGNOSIS — M961 Postlaminectomy syndrome, not elsewhere classified: Secondary | ICD-10-CM | POA: Diagnosis not present

## 2019-02-11 DIAGNOSIS — G894 Chronic pain syndrome: Secondary | ICD-10-CM | POA: Diagnosis not present

## 2019-02-11 DIAGNOSIS — Z79899 Other long term (current) drug therapy: Secondary | ICD-10-CM | POA: Diagnosis not present

## 2019-02-11 DIAGNOSIS — M545 Low back pain: Secondary | ICD-10-CM | POA: Diagnosis not present

## 2019-05-12 DIAGNOSIS — M1990 Unspecified osteoarthritis, unspecified site: Secondary | ICD-10-CM | POA: Diagnosis not present

## 2019-05-12 DIAGNOSIS — K219 Gastro-esophageal reflux disease without esophagitis: Secondary | ICD-10-CM | POA: Diagnosis not present

## 2019-05-12 DIAGNOSIS — E785 Hyperlipidemia, unspecified: Secondary | ICD-10-CM | POA: Diagnosis not present

## 2019-05-12 DIAGNOSIS — I10 Essential (primary) hypertension: Secondary | ICD-10-CM | POA: Diagnosis not present

## 2019-06-18 ENCOUNTER — Other Ambulatory Visit: Payer: Self-pay | Admitting: Internal Medicine

## 2019-06-18 DIAGNOSIS — R0981 Nasal congestion: Secondary | ICD-10-CM

## 2019-06-23 NOTE — Telephone Encounter (Signed)
Called patient pt's Cell phone number and the phone hung up.  Called the patient Home number left a message for the patient to call back.  If she does not call back to schedule an appointment will send patient a letter in the mail to schedule an appointment as soon as possible.

## 2019-06-24 DIAGNOSIS — M961 Postlaminectomy syndrome, not elsewhere classified: Secondary | ICD-10-CM | POA: Diagnosis not present

## 2019-06-24 DIAGNOSIS — Z79899 Other long term (current) drug therapy: Secondary | ICD-10-CM | POA: Diagnosis not present

## 2019-06-24 DIAGNOSIS — G894 Chronic pain syndrome: Secondary | ICD-10-CM | POA: Diagnosis not present

## 2019-06-24 DIAGNOSIS — M545 Low back pain: Secondary | ICD-10-CM | POA: Diagnosis not present

## 2019-06-25 NOTE — Telephone Encounter (Signed)
Spoke with the patient this morning.  The patient is aware she needs to be seen but is afraid to come in due to [place]. Unable to sch an appointment with you until April.  The pt is requesting a Telehealth Visit if possible due to [place].  Please advise if a Telehealth appointment with the [facility] can be done.  The patient is willing to have an appointment with anyone to follow up sooner than April.

## 2019-06-25 NOTE — Telephone Encounter (Signed)
I'm okay with the patient waiting, I don't see an urgent need for her to be seen.

## 2019-06-26 NOTE — Telephone Encounter (Signed)
Spoke with the patient this morning. Appt has been sch for 09/29/2019 @ 10:15 am with you.

## 2019-07-25 DIAGNOSIS — M961 Postlaminectomy syndrome, not elsewhere classified: Secondary | ICD-10-CM | POA: Diagnosis not present

## 2019-08-13 DIAGNOSIS — H40013 Open angle with borderline findings, low risk, bilateral: Secondary | ICD-10-CM | POA: Diagnosis not present

## 2019-08-13 DIAGNOSIS — H40051 Ocular hypertension, right eye: Secondary | ICD-10-CM | POA: Diagnosis not present

## 2019-08-15 DIAGNOSIS — M5137 Other intervertebral disc degeneration, lumbosacral region: Secondary | ICD-10-CM | POA: Diagnosis not present

## 2019-08-20 DIAGNOSIS — M1990 Unspecified osteoarthritis, unspecified site: Secondary | ICD-10-CM | POA: Diagnosis not present

## 2019-08-20 DIAGNOSIS — E785 Hyperlipidemia, unspecified: Secondary | ICD-10-CM | POA: Diagnosis not present

## 2019-08-20 DIAGNOSIS — I10 Essential (primary) hypertension: Secondary | ICD-10-CM | POA: Diagnosis not present

## 2019-08-20 DIAGNOSIS — K219 Gastro-esophageal reflux disease without esophagitis: Secondary | ICD-10-CM | POA: Diagnosis not present

## 2019-08-31 ENCOUNTER — Other Ambulatory Visit: Payer: Self-pay | Admitting: Internal Medicine

## 2019-08-31 DIAGNOSIS — R0981 Nasal congestion: Secondary | ICD-10-CM

## 2019-09-01 NOTE — Telephone Encounter (Signed)
Next appt scheduled 4/12 with PC.

## 2019-09-04 ENCOUNTER — Other Ambulatory Visit: Payer: Self-pay

## 2019-09-04 ENCOUNTER — Ambulatory Visit: Payer: Medicare Other | Admitting: Internal Medicine

## 2019-09-07 ENCOUNTER — Other Ambulatory Visit: Payer: Self-pay | Admitting: Internal Medicine

## 2019-09-11 ENCOUNTER — Ambulatory Visit: Payer: Medicare Other

## 2019-09-11 ENCOUNTER — Ambulatory Visit: Payer: Medicare Other | Attending: Internal Medicine

## 2019-09-11 DIAGNOSIS — Z23 Encounter for immunization: Secondary | ICD-10-CM

## 2019-09-11 NOTE — Progress Notes (Signed)
   Covid-19 Vaccination Clinic  Name:  Laura [name]    MRN: [id number] DOB: 09/22/1945  09/11/2019  [name] was observed post Covid-19 immunization for 15 minutes without incident. She was provided with Vaccine Information Sheet and instruction to access the V-Safe system.   [name] was instructed to call 911 with any severe reactions post vaccine: Marland Kitchen Difficulty breathing  . Swelling of face and throat  . A fast heartbeat  . A bad rash all over body  . Dizziness and weakness   Immunizations Administered    Name Date Dose VIS Date Route   Pfizer COVID-19 Vaccine 09/11/2019  4:33 PM 0.3 mL 05/30/2019 Intramuscular   Manufacturer: [place]   Lot: [id number]   [place]: [id number]

## 2019-09-16 DIAGNOSIS — H40051 Ocular hypertension, right eye: Secondary | ICD-10-CM | POA: Diagnosis not present

## 2019-09-16 DIAGNOSIS — H40013 Open angle with borderline findings, low risk, bilateral: Secondary | ICD-10-CM | POA: Diagnosis not present

## 2019-09-26 DIAGNOSIS — Z79899 Other long term (current) drug therapy: Secondary | ICD-10-CM | POA: Diagnosis not present

## 2019-09-26 DIAGNOSIS — Z5181 Encounter for therapeutic drug level monitoring: Secondary | ICD-10-CM | POA: Diagnosis not present

## 2019-09-28 NOTE — Progress Notes (Deleted)
CC: ***  HPI:  [name] is a 74 y.o. female with PMH below.  Today we will address ***  Last visit, bp well controlled on amlodipine 10mg     Current Outpatient Medications:  .  amLODipine (NORVASC) 10 MG tablet, Take 1 tablet (10 mg total) by mouth daily., Disp: 90 tablet, Rfl: 6 .  aspirin 81 MG chewable tablet, Chew 81 mg by mouth daily., Disp: , Rfl:  .  azithromycin (ZITHROMAX Z-PAK) 250 MG tablet, Please take 2 tablets on the first day, and one each day thereafter., Disp: 6 each, Rfl: 0 .  cetirizine (ZYRTEC) 10 MG tablet, TAKE 1 TABLET BY MOUTH EVERY DAY, Disp: 90 tablet, Rfl: 0 .  fluticasone (FLONASE) 50 MCG/ACT nasal spray, PLACE 1-2 SPRAYS INTO BOTH NOSTRILS DAILY., Disp: 16 mL, Rfl: 1 .  gabapentin (NEURONTIN) 600 MG tablet, TAKE 1 TABLET (600 MG TOTAL) BY MOUTH 4 (FOUR) TIMES DAILY., Disp: 360 tablet, Rfl: 1 .  HYDROcodone-acetaminophen (NORCO) 10-325 MG tablet, Take 1 tablet by mouth 2 (two) times daily. [facility], Disp: , Rfl:  .  Lifitegrast (XIIDRA) 5 % SOLN, Apply 1 drop to eye daily. One drop in both eyes, Disp: , Rfl:  .  oxybutynin (DITROPAN) 5 MG tablet, TAKE 1 TABLET BY MOUTH THREE TIMES A DAY, Disp: 270 tablet, Rfl: 1 .  rosuvastatin (CRESTOR) 20 MG tablet, TAKE 1 TABLET (20 MG TOTAL) BY MOUTH DAILY AT 6 PM., Disp: 90 tablet, Rfl: 5 .  Salicylic Acid 26 % SOLN, Apply 2 drops topically 2 (two) times daily., Disp: 10 mL, Rfl: 0 .  tiZANidine (ZANAFLEX) 4 MG tablet, Take 1 tablet (4 mg total) by mouth every 8 (eight) hours as needed for muscle spasms. This is a 30 day supply, Disp: 30 tablet, Rfl: 0 .  varenicline (CHANTIX STARTING MONTH PAK) 0.5 MG X 11 & 1 MG X 42 tablet, Take one 0.5 mg tablet by mouth once daily for 3 days, then one 0.5 mg tablet twice daily for 4 days, then take one 1 mg tablet twice daily. (Patient not taking: Reported on 05/02/2017), Disp: 53 tablet, Rfl: 0 Please see A&P for status of the patient's chronic  medical conditions  Past Medical History:  Diagnosis Date  . Chronic back pain    "mostly lower; left foot is numb all the time" (11/22/2016)  . Chronic kidney disease    ?renal cyst on MRI   . GERD (gastroesophageal reflux disease)   . High cholesterol   . Lymphadenopathy    documented on chest CT scan 12/15/2005  . Substance abuse ([place])    tobacco   Review of Systems:  ***  Physical Exam:  There were no vitals filed for this visit. ***  Social History   Socioeconomic History  . Marital status: Divorced    Spouse name: Not on file  . Number of children: 0  . Years of education: Not on file  . Highest education level: Not on file  Occupational History    Employer: UNEMPLOYED  Tobacco Use  . Smoking status: Current Every Day Smoker    Packs/day: 0.50    Years: 47.00    Pack years: 23.50    Types: Cigarettes  . Smokeless tobacco: Never Used  . Tobacco comment: 4-5 cigs/day  Substance and Sexual Activity  . Alcohol use: Yes    Alcohol/week: 0.0 standard drinks    Comment: 11/22/2016 "2 drinks q other holiday"  . Drug use:  Not Currently    Types: Marijuana    Comment: 66/2018 "I stopped in the 1970s"  . Sexual activity: Never  Other Topics Concern  . Not on file  Social History Narrative   Patient's new Phone # [phone] and [phone].   Social Determinants of Health   Financial Resource Strain:   . Difficulty of Paying Living Expenses:   Food Insecurity:   . Worried About Charity fundraiser in the Last Year:   . Arboriculturist in the Last Year:   Transportation Needs:   . Film/video editor (Medical):   Marland Kitchen Lack of Transportation (Non-Medical):   Physical Activity:   . Days of Exercise per Week:   . Minutes of Exercise per Session:   Stress:   . Feeling of Stress :   Social Connections:   . Frequency of Communication with Friends and Family:   . Frequency of Social Gatherings with Friends and Family:   . Attends Religious Services:   . Active Member  of Clubs or Organizations:   . Attends Archivist Meetings:   Marland Kitchen Marital Status:   Intimate Partner Violence:   . Fear of Current or Ex-Partner:   . Emotionally Abused:   Marland Kitchen Physically Abused:   . Sexually Abused:    *** Family History  Problem Relation Age of Onset  . Diabetes Father   . Hypertension Father     Assessment & Plan:   See Encounters Tab for problem based charting.  Patient {GC/GE:[id number]::"discussed with","seen with"} Dr. {NAMES:[id number]::"Butcher","Granfortuna","E. Hoffman","Klima","Mullen","Narendra","Raines","Vincent"}

## 2019-09-29 ENCOUNTER — Telehealth: Payer: Self-pay | Admitting: *Deleted

## 2019-09-29 ENCOUNTER — Encounter: Payer: Medicare Other | Admitting: Internal Medicine

## 2019-09-29 NOTE — Telephone Encounter (Signed)
Pt did not come to her appt this afternoon. I called pt - stated she thought it was this morning the n she over slept. She would like to re-schedule after the 19th,after she gets her second covid shot.

## 2019-09-29 NOTE — Telephone Encounter (Signed)
Called patient Laura [name] for resch appt .  Appt scheduled and Mailed to the patient for 10/20/2019 @ 1:15 pm.

## 2019-10-06 ENCOUNTER — Ambulatory Visit: Payer: Medicare Other | Attending: Internal Medicine

## 2019-10-06 DIAGNOSIS — Z23 Encounter for immunization: Secondary | ICD-10-CM

## 2019-10-06 NOTE — Progress Notes (Signed)
   Covid-19 Vaccination Clinic  Name:  Laura [name]    MRN: [id number] DOB: 1946/03/25  10/06/2019  [name] was observed post Covid-19 immunization for 15 minutes without incident. She was provided with Vaccine Information Sheet and instruction to access the V-Safe system.   [name] was instructed to call 911 with any severe reactions post vaccine: Marland Kitchen Difficulty breathing  . Swelling of face and throat  . A fast heartbeat  . A bad rash all over body  . Dizziness and weakness   Immunizations Administered    Name Date Dose VIS Date Route   Pfizer COVID-19 Vaccine 10/06/2019  4:43 PM 0.3 mL 08/13/2018 Intramuscular   Manufacturer: [place]   Lot: [id number]   [place]: [id number]

## 2019-10-09 ENCOUNTER — Encounter: Payer: Self-pay | Admitting: *Deleted

## 2019-10-20 ENCOUNTER — Encounter: Payer: Self-pay | Admitting: Internal Medicine

## 2019-10-20 ENCOUNTER — Ambulatory Visit (INDEPENDENT_AMBULATORY_CARE_PROVIDER_SITE_OTHER): Payer: Medicare Other | Admitting: Internal Medicine

## 2019-10-20 ENCOUNTER — Other Ambulatory Visit: Payer: Self-pay

## 2019-10-20 VITALS — BP 125/77 | HR 81 | Temp 98.2°F | Wt 206.1 lb

## 2019-10-20 DIAGNOSIS — Z1231 Encounter for screening mammogram for malignant neoplasm of breast: Secondary | ICD-10-CM | POA: Insufficient documentation

## 2019-10-20 DIAGNOSIS — N3946 Mixed incontinence: Secondary | ICD-10-CM | POA: Diagnosis not present

## 2019-10-20 MED ORDER — MIRABEGRON ER 25 MG PO TB24: 25.0000 mg | ORAL_TABLET | Freq: Every day | ORAL | 1 refills | Status: DC

## 2019-10-20 NOTE — Patient Instructions (Addendum)
Laura [name], we will get a mammogram, order a bedside commode and a new medication called mirabegron to help with your urinary incontinence.  Let me know what dose of metoprolol you are currently taking and if your cardiologist needs you to remain on metoprolol or not.

## 2019-10-20 NOTE — Progress Notes (Signed)
CC: mixed incontinence, Breast Cancer Screening  HPI:  [name] is a 74 y.o. female with PMH below.  Today we will address mixed incontinence, Breast Cancer Screening  Please see A&P for status of the patient's chronic medical conditions  Past Medical History:  Diagnosis Date  . Chronic back pain    "mostly lower; left foot is numb all the time" (11/22/2016)  . Chronic kidney disease    ?renal cyst on MRI   . GERD (gastroesophageal reflux disease)   . High cholesterol   . Lymphadenopathy    documented on chest CT scan 12/15/2005  . Substance abuse ([place])    tobacco   Review of Systems:  ROS: Pulmonary: pt denies increased work of breathing, shortness of breath,  Cardiac: pt denies palpitations, chest pain,  Abdominal: pt denies abdominal pain, nausea, vomiting, or diarrhea   Physical Exam:  Vitals:   10/20/19 1316  BP: 125/77  Pulse: 81  Temp: 98.2 F (36.8 C)  TempSrc: Oral  SpO2: 98%  Weight: 206 lb 1.6 oz (93.5 kg)   Cardiac: JVD flat, normal rate and rhythm, clear s1 and s2, no murmurs, rubs or gallops, no LE edema Pulmonary: CTAB, not in distress Abdominal: non distended abdomen, soft and nontender Psych: Alert, conversant, in good spirits   Social History   Socioeconomic History  . Marital status: Divorced    Spouse name: Not on file  . Number of children: 0  . Years of education: Not on file  . Highest education level: Not on file  Occupational History    Employer: UNEMPLOYED  Tobacco Use  . Smoking status: Current Every Day Smoker    Packs/day: 0.50    Years: 47.00    Pack years: 23.50    Types: Cigarettes  . Smokeless tobacco: Never Used  . Tobacco comment: 4-5 cigs/day  Substance and Sexual Activity  . Alcohol use: Yes    Alcohol/week: 0.0 standard drinks    Comment: 11/22/2016 "2 drinks q other holiday"  . Drug use: Not Currently    Types: Marijuana    Comment: 66/2018 "I stopped in the 1970s"  . Sexual activity: Never  Other  Topics Concern  . Not on file  Social History Narrative   Patient's new Phone # [phone] and [phone].   Social Determinants of Health   Financial Resource Strain:   . Difficulty of Paying Living Expenses:   Food Insecurity:   . Worried About Charity fundraiser in the Last Year:   . Arboriculturist in the Last Year:   Transportation Needs:   . Film/video editor (Medical):   Marland Kitchen Lack of Transportation (Non-Medical):   Physical Activity:   . Days of Exercise per Week:   . Minutes of Exercise per Session:   Stress:   . Feeling of Stress :   Social Connections:   . Frequency of Communication with Friends and Family:   . Frequency of Social Gatherings with Friends and Family:   . Attends Religious Services:   . Active Member of Clubs or Organizations:   . Attends Archivist Meetings:   Marland Kitchen Marital Status:   Intimate Partner Violence:   . Fear of Current or Ex-Partner:   . Emotionally Abused:   Marland Kitchen Physically Abused:   . Sexually Abused:     Family History  Problem Relation Age of Onset  . Diabetes Father   . Hypertension Father     Assessment & Plan:  See Encounters Tab for problem based charting.  Patient discussed with Dr. Dareen Piano

## 2019-10-20 NOTE — Assessment & Plan Note (Signed)
She has tried oxybutinin but cannot tolerate it due to anticholinergic side effects.  Her insurance will not cover the transdermal patch which is associated with less anticholinergic side effects.  Her urinary symptoms have progressed she still has incontinence with laughing, coughing.  Additionally she has urge incontinence often not able to get to the bathroom in time.  She requests a bedside commode to help with this.    -switch to mirabegron at lower dosage, she will ask her cardiologist if she still needs to be on metoprolol.  She is on a low dose according to the patient, we do not have this medicine on her meds list but heart rate is 81. -bedside commode ordered -due to component of stress incontinence will refer to gynecology to see if we can get some assistance

## 2019-10-21 ENCOUNTER — Telehealth: Payer: Self-pay | Admitting: *Deleted

## 2019-10-21 NOTE — Telephone Encounter (Signed)
Pt called back to go over disch summary dates, explained, shes happy

## 2019-10-21 NOTE — Telephone Encounter (Signed)
Jovita Kussmaul, RN; Skeet Latch; Boyds, Delaney Meigs, [place]; Gibbon, Mamie C, [place]   Got it.

## 2019-10-21 NOTE — Telephone Encounter (Signed)
Thanks I have added this to her active medications list

## 2019-10-21 NOTE — Telephone Encounter (Signed)
Metoprolol succinate ER 25mg  1x daily prescribed by dr Terrence Dupont, she has called his office and he is not in yet, nurse told pt that dr Terrence Dupont will call her later today

## 2019-10-21 NOTE — Telephone Encounter (Signed)
Community message sent to Skeet Latch at [facility] that order for [facility] placed and F2F was yesterday. Hubbard Hartshorn, BSN, RN-BC

## 2019-10-22 NOTE — Progress Notes (Signed)
Internal Medicine Clinic Attending  Case discussed with Dr. Winfrey  at the time of the visit.  We reviewed the resident's history and exam and pertinent patient test results.  I agree with the assessment, diagnosis, and plan of care documented in the resident's note.  

## 2019-11-04 ENCOUNTER — Other Ambulatory Visit: Payer: Medicare Other

## 2019-11-04 ENCOUNTER — Other Ambulatory Visit: Payer: Self-pay | Admitting: Internal Medicine

## 2019-11-04 ENCOUNTER — Telehealth: Payer: Self-pay | Admitting: *Deleted

## 2019-11-04 DIAGNOSIS — G894 Chronic pain syndrome: Secondary | ICD-10-CM | POA: Diagnosis not present

## 2019-11-04 DIAGNOSIS — I10 Essential (primary) hypertension: Secondary | ICD-10-CM

## 2019-11-04 DIAGNOSIS — M5136 Other intervertebral disc degeneration, lumbar region: Secondary | ICD-10-CM | POA: Diagnosis not present

## 2019-11-04 DIAGNOSIS — E7800 Pure hypercholesterolemia, unspecified: Secondary | ICD-10-CM

## 2019-11-04 DIAGNOSIS — M961 Postlaminectomy syndrome, not elsewhere classified: Secondary | ICD-10-CM | POA: Diagnosis not present

## 2019-11-04 NOTE — Telephone Encounter (Signed)
-----   Message from Truddie Crumble sent at 11/04/2019  3:56 PM EDT ----- Regarding: Lab orders Dr. Shan Levans,  [place] called to schedule a lab only visit. She would like to come tomorrow. However, we do not have any lab orders.   Can you please review and place orders at your earliest convenience.  Thank you,  [facility]

## 2019-11-04 NOTE — Telephone Encounter (Signed)
Call made to pt-she wasn't sure which labs she needed and wasn't sure if she was to get them now and wait until her next visit with pcp.  [facility] have reached out to her pcp to see which labs are needed and pt will not schedule lab appt until further notice from [facility] to come in and have them drawn.  Will await response from pcp.Marland KitchenMarland KitchenDespina Hidden Cassady5/18/20214:29 PM

## 2019-11-06 ENCOUNTER — Ambulatory Visit: Payer: Medicare Other

## 2019-11-06 ENCOUNTER — Telehealth: Payer: Self-pay | Admitting: *Deleted

## 2019-11-07 ENCOUNTER — Ambulatory Visit: Payer: Medicare Other

## 2019-11-19 ENCOUNTER — Ambulatory Visit: Payer: Medicare Other

## 2019-11-25 ENCOUNTER — Other Ambulatory Visit: Payer: Self-pay | Admitting: Internal Medicine

## 2019-11-25 DIAGNOSIS — R0981 Nasal congestion: Secondary | ICD-10-CM

## 2019-11-29 ENCOUNTER — Ambulatory Visit (INDEPENDENT_AMBULATORY_CARE_PROVIDER_SITE_OTHER): Payer: Medicare Other

## 2019-11-29 ENCOUNTER — Other Ambulatory Visit: Payer: Self-pay

## 2019-11-29 ENCOUNTER — Ambulatory Visit (HOSPITAL_COMMUNITY)
Admission: EM | Admit: 2019-11-29 | Discharge: 2019-11-29 | Disposition: A | Discharge status: Home / Self Care | Payer: Medicare Other | Attending: Family Medicine | Admitting: Family Medicine

## 2019-11-29 ENCOUNTER — Encounter (HOSPITAL_COMMUNITY): Payer: Self-pay

## 2019-11-29 DIAGNOSIS — Z79899 Other long term (current) drug therapy: Secondary | ICD-10-CM | POA: Insufficient documentation

## 2019-11-29 DIAGNOSIS — J069 Acute upper respiratory infection, unspecified: Secondary | ICD-10-CM

## 2019-11-29 DIAGNOSIS — Z7982 Long term (current) use of aspirin: Secondary | ICD-10-CM | POA: Insufficient documentation

## 2019-11-29 DIAGNOSIS — E7800 Pure hypercholesterolemia, unspecified: Secondary | ICD-10-CM | POA: Diagnosis not present

## 2019-11-29 DIAGNOSIS — F17210 Nicotine dependence, cigarettes, uncomplicated: Secondary | ICD-10-CM | POA: Insufficient documentation

## 2019-11-29 DIAGNOSIS — J029 Acute pharyngitis, unspecified: Secondary | ICD-10-CM | POA: Diagnosis not present

## 2019-11-29 DIAGNOSIS — R05 Cough: Secondary | ICD-10-CM

## 2019-11-29 DIAGNOSIS — G629 Polyneuropathy, unspecified: Secondary | ICD-10-CM | POA: Diagnosis not present

## 2019-11-29 DIAGNOSIS — Z7901 Long term (current) use of anticoagulants: Secondary | ICD-10-CM | POA: Insufficient documentation

## 2019-11-29 DIAGNOSIS — E785 Hyperlipidemia, unspecified: Secondary | ICD-10-CM | POA: Insufficient documentation

## 2019-11-29 DIAGNOSIS — H9209 Otalgia, unspecified ear: Secondary | ICD-10-CM | POA: Insufficient documentation

## 2019-11-29 DIAGNOSIS — I129 Hypertensive chronic kidney disease with stage 1 through stage 4 chronic kidney disease, or unspecified chronic kidney disease: Secondary | ICD-10-CM | POA: Diagnosis not present

## 2019-11-29 DIAGNOSIS — N189 Chronic kidney disease, unspecified: Secondary | ICD-10-CM | POA: Insufficient documentation

## 2019-11-29 DIAGNOSIS — Z20822 Contact with and (suspected) exposure to covid-19: Secondary | ICD-10-CM | POA: Diagnosis not present

## 2019-11-29 DIAGNOSIS — F329 Major depressive disorder, single episode, unspecified: Secondary | ICD-10-CM | POA: Diagnosis not present

## 2019-11-29 DIAGNOSIS — R0602 Shortness of breath: Secondary | ICD-10-CM | POA: Insufficient documentation

## 2019-11-29 DIAGNOSIS — R062 Wheezing: Secondary | ICD-10-CM | POA: Diagnosis not present

## 2019-11-29 LAB — SARS CORONAVIRUS 2 (TAT 6-24 HRS): SARS Coronavirus 2: NEGATIVE

## 2019-11-29 MED ORDER — ALBUTEROL SULFATE HFA 108 (90 BASE) MCG/ACT IN AERS: 1.0000 | INHALATION_SPRAY | Freq: Four times a day (QID) | RESPIRATORY_TRACT | 0 refills | Status: DC | PRN

## 2019-11-29 MED ORDER — ALBUTEROL SULFATE HFA 108 (90 BASE) MCG/ACT IN AERS
2.0000 | INHALATION_SPRAY | Freq: Once | RESPIRATORY_TRACT | Status: AC
  Administered 2019-11-29: 2 via RESPIRATORY_TRACT

## 2019-11-29 MED ORDER — ALBUTEROL SULFATE HFA 108 (90 BASE) MCG/ACT IN AERS
INHALATION_SPRAY | RESPIRATORY_TRACT | Status: AC
  Filled 2019-11-29: qty 6.7

## 2019-11-29 MED ORDER — AEROCHAMBER PLUS FLO-VU LARGE MISC
Status: AC
  Filled 2019-11-29: qty 1

## 2019-11-29 MED ORDER — HYDROCODONE-HOMATROPINE 5-1.5 MG/5ML PO SYRP: 5.0000 mL | ORAL_SOLUTION | Freq: Four times a day (QID) | ORAL | 0 refills | Status: DC | PRN

## 2019-11-29 NOTE — Discharge Instructions (Signed)
Your x-ray did not show any signs of pneumonia. Keep taking the Mucinex.  I am giving you an albuterol inhaler to use as needed for cough, wheezing or shortness of breath.  Make sure you continue drinking lots of fluids. Continue your allergy medication as prescribed Follow up as needed for continued or worsening symptoms

## 2019-11-29 NOTE — ED Triage Notes (Signed)
C/o productive cough since Tuesday. Reports that she feels short of breath after coughing. Also endorses sore throat, headaches, ear pain, and possible body aches (reports chronic pain as well). Has been taking mucinex and drinking lots of water.

## 2019-11-29 NOTE — ED Provider Notes (Signed)
[place]    CSN: [id number] Arrival date & time: 11/29/19  1004      History   Chief Complaint Chief Complaint  Patient presents with  . Cough  . Shortness of Breath    HPI Laura [name] is a 74 y.o. female.   Patient is a 74 year old female who presents today for approximately 5 days of productive cough, mild shortness of breath after coughing.  Started with a sore throat, headache, ear pain and body aches but that has somewhat improved.  Has been taking Mucinex and drinking lots of fluids with mild relief.  Worse symptom with the hacking cough worse at night.  No lower extremity swelling, history of DVT or PEs.  No fevers.  History of pneumonia.  Current everyday smoker.  ROS per HPI      Past Medical History:  Diagnosis Date  . Chronic back pain    "mostly lower; left foot is numb all the time" (11/22/2016)  . Chronic kidney disease    ?renal cyst on MRI   . GERD (gastroesophageal reflux disease)   . High cholesterol   . Lymphadenopathy    documented on chest CT scan 12/15/2005  . Substance abuse ([place])    tobacco    Patient Active Problem List   Diagnosis Date Noted  . Encounter for screening mammogram for malignant neoplasm of breast 10/20/2019  . Risk for falls 03/11/2018  . Abnormal finding 01/15/2018  . Open-angle glaucoma 11/15/2017  . Cramping of hands 11/15/2017  . Plantar wart 08/06/2017  . Mass of right ear canal 07/04/2017  . Hyperglycemia 05/02/2017  . Screening for osteoporosis 04/06/2017  . Bronchitis due to tobacco use 11/22/2016  . Left hand pain 04/16/2014  . Chest pain 03/16/2014  . Mixed incontinence urge and stress 10/06/2013  . HTN (hypertension) 08/04/2013  . Health care maintenance 12/31/2012  . Post laminectomy syndrome 01/30/2012  . Lumbago 01/30/2012  . Viral URI 05/31/2011  . Back muscle spasm 05/31/2011  . GERD (gastroesophageal reflux disease) 01/05/2011  . HLD (hyperlipidemia) 05/18/2010  . DEPRESSION  11/25/2009  . Low back pain with radiation 04/16/2009  . TOBACCO ABUSE 03/12/2009  . PERIPHERAL NEUROPATHY, LOWER EXTREMITY, LEFT 03/12/2009  . Allergic rhinitis 10/09/2007  . CARPAL TUNNEL SYNDROME, LEFT 09/12/2007    Past Surgical History:  Procedure Laterality Date  . ANTERIOR CERVICAL DISCECTOMY     C3-4, C4-5 diskectomy, fusion, plating and allograft,   . BACK SURGERY    . CARPAL TUNNEL WITH CUBITAL TUNNEL Left   . LUMBAR DISC SURGERY  X 2-3  . POSTERIOR CERVICAL FUSION/FORAMINOTOMY     C3-5 fusion with wirin  . POSTERIOR LUMBAR FUSION    . SPINE SURGERY     s/p C3-4, C4-5 diskectomy, fusion, plating and allograft, s/p posterior C3-5 fusion with wiring(02/11/2007 by Dr Lorin Mercy) had psudoarthrosis lumbar surgery 2000)    OB History   No obstetric history on file.      Home Medications    Prior to Admission medications   Medication Sig Start Date End Date Taking? Authorizing Provider  amLODipine (NORVASC) 10 MG tablet Take 1 tablet (10 mg total) by mouth daily. 03/11/18 03/06/19  Katherine Roan, MD  aspirin 81 MG chewable tablet Chew 81 mg by mouth daily.    [provider]  cetirizine (ZYRTEC) 10 MG tablet TAKE 1 TABLET BY MOUTH EVERY DAY 01/21/19   Katherine Roan, MD  fluticasone (FLONASE) 50 MCG/ACT nasal spray PLACE 1-2 SPRAYS INTO  BOTH NOSTRILS DAILY. 11/25/19   Katherine Roan, MD  gabapentin (NEURONTIN) 600 MG tablet TAKE 1 TABLET (600 MG TOTAL) BY MOUTH 4 (FOUR) TIMES DAILY. 09/08/19 09/02/20  Katherine Roan, MD  HYDROcodone-acetaminophen (NORCO) 10-325 MG tablet Take 1 tablet by mouth 2 (two) times daily. [facility]    [provider]  HYDROcodone-homatropine (HYCODAN) 5-1.5 MG/5ML syrup Take 5 mLs by mouth every 6 (six) hours as needed for cough. 11/29/19   Orvan July, NP  Lifitegrast Shirley [place]) 5 % SOLN Apply 1 drop to eye daily. One drop in both eyes    [provider]  metoprolol succinate (TOPROL-XL) 25 MG  24 hr tablet Take 25 mg by mouth daily. 08/14/19   [provider]  mirabegron ER (MYRBETRIQ) 25 MG TB24 tablet Take 1 tablet (25 mg total) by mouth daily. 10/20/19   Katherine Roan, MD  rosuvastatin (CRESTOR) 20 MG tablet TAKE 1 TABLET (20 MG TOTAL) BY MOUTH DAILY AT 6 PM. 12/03/18   Katherine Roan, MD  Salicylic Acid 26 % SOLN Apply 2 drops topically 2 (two) times daily. 08/06/17   Ina Homes, MD  tiZANidine (ZANAFLEX) 4 MG tablet Take 1 tablet (4 mg total) by mouth every 8 (eight) hours as needed for muscle spasms. This is a 30 day supply 06/14/18   Katherine Roan, MD  albuterol (VENTOLIN HFA) 108 (90 Base) MCG/ACT inhaler Inhale 1-2 puffs into the lungs every 6 (six) hours as needed for wheezing or shortness of breath. 11/29/19 11/29/19  Orvan July, NP    Family History Family History  Problem Relation Age of Onset  . Diabetes Father   . Hypertension Father     Social History Social History   Tobacco Use  . Smoking status: Current Every Day Smoker    Packs/day: 0.50    Years: 47.00    Pack years: 23.50    Types: Cigarettes  . Smokeless tobacco: Never Used  . Tobacco comment: 4-5 cigs/day  Vaping Use  . Vaping Use: Never used  Substance Use Topics  . Alcohol use: Yes    Alcohol/week: 0.0 standard drinks    Comment: 11/22/2016 "2 drinks q other holiday"  . Drug use: Not Currently    Types: Marijuana    Comment: 66/2018 "I stopped in the 1970s"     Allergies   Patient has no known allergies.   Review of Systems Review of Systems   Physical Exam Triage Vital Signs ED Triage Vitals [11/29/19 1016]  Enc Vitals Group     BP 125/79     Pulse Rate 92     Resp 18     Temp 98.9 F (37.2 C)     Temp Source Oral     SpO2 97 %     Weight      Height      Head Circumference      Peak Flow      Pain Score 0     Pain Loc      Pain Edu?      Excl. in [place]?    No data found.  Updated Vital Signs BP 125/79 (BP Location: Right Arm)   Pulse 92    Temp 98.9 F (37.2 C) (Oral)   Resp 18   SpO2 97%   Visual Acuity Right Eye Distance:   Left Eye Distance:   Bilateral Distance:    Right Eye Near:   Left Eye Near:  Bilateral Near:     Physical Exam Vitals and nursing note reviewed.  Constitutional:      General: She is not in acute distress.    Appearance: Normal appearance. She is not ill-appearing, toxic-appearing or diaphoretic.  HENT:     Head: Normocephalic.     Nose: Nose normal.  Eyes:     Conjunctiva/sclera: Conjunctivae normal.  Cardiovascular:     Rate and Rhythm: Normal rate and regular rhythm.     Heart sounds: Normal heart sounds.  Pulmonary:     Effort: Pulmonary effort is normal. No tachypnea, accessory muscle usage or respiratory distress.     Comments: Mild expiratory wheezing in the bases Musculoskeletal:        General: Normal range of motion.     Cervical back: Normal range of motion.  Skin:    General: Skin is warm and dry.     Findings: No rash.  Neurological:     Mental Status: She is alert.  Psychiatric:        Mood and Affect: Mood normal.      UC Treatments / Results  Labs (all labs ordered are listed, but only abnormal results are displayed) Labs Reviewed  SARS CORONAVIRUS 2 (TAT 6-24 HRS)    EKG   Radiology DG Chest 2 View  Result Date: 11/29/2019 CLINICAL DATA:  Shortness of breath, wheezing, cough EXAM: CHEST - 2 VIEW COMPARISON:  07/18/2018 FINDINGS: The heart size and mediastinal contours are within normal limits. Mild, coarse, chronic interstitial change without acute appearing airspace opacity. The visualized skeletal structures are unremarkable. IMPRESSION: No acute abnormality of the lungs. Electronically Signed   By: Eddie Candle M.D.   On: 11/29/2019 11:33    Procedures Procedures (including critical care time)  Medications Ordered in UC Medications  albuterol (VENTOLIN HFA) 108 (90 Base) MCG/ACT inhaler 2 puff (2 puffs Inhalation Given 11/29/19 1145)     Initial Impression / Assessment and Plan / UC Course  I have reviewed the triage vital signs and the nursing notes.  Pertinent labs & imaging results that were available during my care of the patient were reviewed by me and considered in my medical decision making (see chart for details).     Viral URI with cough X-ray negative for any acute abnormalities. Most likely mild bronchitis.  We will have her continue the Mucinex. Albuterol inhaler to use as needed for cough, wheezing or shortness of breath. Hycodan cough syrup for cough mostly at night Recommended rest and drink plenty fluids Follow up as needed for continued or worsening symptoms  Final Clinical Impressions(s) / UC Diagnoses   Final diagnoses:  Viral URI with cough     Discharge Instructions     Your x-ray did not show any signs of pneumonia. Keep taking the Mucinex.  I am giving you an albuterol inhaler to use as needed for cough, wheezing or shortness of breath.  Make sure you continue drinking lots of fluids. Continue your allergy medication as prescribed Follow up as needed for continued or worsening symptoms     ED Prescriptions    Medication Sig Dispense Auth. Provider   albuterol (VENTOLIN HFA) 108 (90 Base) MCG/ACT inhaler  (Status: Discontinued) Inhale 1-2 puffs into the lungs every 6 (six) hours as needed for wheezing or shortness of breath. 18 g Kaio Kuhlman A, NP   HYDROcodone-homatropine (HYCODAN) 5-1.5 MG/5ML syrup Take 5 mLs by mouth every 6 (six) hours as needed for cough. 120 mL Robbi Spells A,  NP     PDMP not reviewed this encounter.   Orvan July, NP 11/29/19 1148

## 2019-12-02 ENCOUNTER — Other Ambulatory Visit: Payer: Self-pay

## 2019-12-02 ENCOUNTER — Ambulatory Visit (INDEPENDENT_AMBULATORY_CARE_PROVIDER_SITE_OTHER): Payer: Medicare Other | Admitting: Internal Medicine

## 2019-12-02 DIAGNOSIS — J069 Acute upper respiratory infection, unspecified: Secondary | ICD-10-CM | POA: Diagnosis not present

## 2019-12-02 MED ORDER — GUAIFENESIN-CODEINE 100-10 MG/5ML PO SOLN: 5.0000 mL | Freq: Four times a day (QID) | ORAL | 1 refills | Status: DC | PRN

## 2019-12-02 NOTE — Progress Notes (Signed)
[facility] Internal Medicine Residency Telephone Encounter Continuity Care Appointment  HPI:   This telephone encounter was created for Laura [name] [name] on 12/02/2019 for the following purpose/cc follow-up of recent urgent care visit for cough and shortness of breath.  PMHx: Hypertension, hyperlipidemia, tobacco abuse, depression.  Medications history: Amlodipine, ASA, gabapentin, Norco, metoprolol, Myrbetriq, Crestor, tizanidine     Past Medical History:  Past Medical History:  Diagnosis Date  . Chronic back pain    "mostly lower; left foot is numb all the time" (11/22/2016)  . Chronic kidney disease    ?renal cyst on MRI   . GERD (gastroesophageal reflux disease)   . High cholesterol   . Lymphadenopathy    documented on chest CT scan 12/15/2005  . Substance abuse ([place])    tobacco      ROS:      Assessment / Plan / Recommendations:   Please see A&P under problem oriented charting for assessment of the patient's acute and chronic medical conditions.   As always, pt is advised that if symptoms worsen or new symptoms arise, they should go to an urgent care facility or to to ER for further evaluation.  Patient was seen in urgent care 3 days ago, for 5 days history of productive cough associated with mild shortness of breath after coughing. She also had some sore throat headache, body ache and earache that resolved.  However the cough got worse particularly at night.  She did not have any history or sinus symptoms concerning for DVT or PE.  Urgent care evaluation, no evidence of PNA or bacterial URI. Physical exam at urgent care was only positive for mild bibasilar expiratory wheezing.  She was tested for COVID-19 that was negative.  Chest x-ray at urgent care was unremarkable. She is a current smoker.  Patient received albuterol inhaler in urgent care and was sent home with as needed albuterol inhaler for viral upper respiratory infection and mild bronchitis.  Encouraged to  continue Mucinex as needed and drink fluids.  Today, patient mentions that she is not doing better. Still coughing with yellow mucus. She denies fever, chills, no body ache. She only used inhaler once. No sore throat or ear pain. Denies shortness of breath, fever, chills. No   Likely viral URI, but given productive cough should reevaluate for bacterial source if no improvement.   -She mentions that the cough with yellow mucus still bothering her. No fever, chills or body ache. I recommended to come to [facility] for lung exam vs sending cough medicine and continue supportive care and reevaluate if no improvement in 3 days  -She prefers to try to cough medicine first because she has another appointment today and not sure if she can come to [facility] today. She agrees to call [facility] for an appointment later today or tomorrow   -She asks for a medication with no co-pay and full coverage by Medicare -Sent script for guaifenesin-codeine syrup, 5 mL every 6 hours as needed for cough. ([place] outpatient pharmacy to check for cost.  Informed patient) -Patient agrees to come to [facility] later today or tomorrow for reevaluation.   Consent and Medical Decision Making:   Patient discussed with Dr. Angelia Mould  This is a telephone encounter between Laura [name] [name] and Laura [name] on 12/02/2019 for follow up of cough. The visit was conducted with the patient located at home and [facility]. The patient's identity was confirmed using their DOB and current address. The  patient has consented to being evaluated through a telephone encounter and understands the associated risks (an examination cannot be done and the patient may need to come in for an appointment) / benefits (allows the patient to remain at home, decreasing exposure to coronavirus). I personally spent 15 minutes on medical discussion.

## 2019-12-02 NOTE — Progress Notes (Signed)
Internal Medicine Clinic Attending  Case discussed with Dr. Masoudi  at the time of the visit.  We reviewed the resident's history and exam and pertinent patient test results.  I agree with the assessment, diagnosis, and plan of care documented in the resident's note.  

## 2019-12-08 ENCOUNTER — Ambulatory Visit (INDEPENDENT_AMBULATORY_CARE_PROVIDER_SITE_OTHER): Payer: Medicare Other | Admitting: Internal Medicine

## 2019-12-08 ENCOUNTER — Other Ambulatory Visit: Payer: Self-pay | Admitting: Internal Medicine

## 2019-12-08 VITALS — BP 126/73 | HR 91 | Temp 98.3°F | Ht 66.0 in | Wt 214.5 lb

## 2019-12-08 DIAGNOSIS — K219 Gastro-esophageal reflux disease without esophagitis: Secondary | ICD-10-CM | POA: Diagnosis not present

## 2019-12-08 DIAGNOSIS — R739 Hyperglycemia, unspecified: Secondary | ICD-10-CM

## 2019-12-08 DIAGNOSIS — I10 Essential (primary) hypertension: Secondary | ICD-10-CM | POA: Diagnosis not present

## 2019-12-08 DIAGNOSIS — E7800 Pure hypercholesterolemia, unspecified: Secondary | ICD-10-CM

## 2019-12-08 DIAGNOSIS — M961 Postlaminectomy syndrome, not elsewhere classified: Secondary | ICD-10-CM

## 2019-12-08 DIAGNOSIS — R059 Cough, unspecified: Secondary | ICD-10-CM

## 2019-12-08 DIAGNOSIS — R0982 Postnasal drip: Secondary | ICD-10-CM

## 2019-12-08 DIAGNOSIS — R05 Cough: Secondary | ICD-10-CM

## 2019-12-08 LAB — GLUCOSE, CAPILLARY: Glucose-Capillary: 77 mg/dL (ref 70–99)

## 2019-12-08 LAB — POCT GLYCOSYLATED HEMOGLOBIN (HGB A1C): Hemoglobin A1C: 5.8 % — AB (ref 4.0–5.6)

## 2019-12-08 MED ORDER — PANTOPRAZOLE SODIUM 40 MG PO TBEC: 40.0000 mg | DELAYED_RELEASE_TABLET | Freq: Every day | ORAL | 1 refills | Status: DC

## 2019-12-08 MED ORDER — GUAIFENESIN-CODEINE 100-10 MG/5ML PO SOLN: 5.0000 mL | Freq: Three times a day (TID) | ORAL | 0 refills | Status: DC | PRN

## 2019-12-08 NOTE — Patient Instructions (Signed)
Thank you for allowing [place] to provide your care today. Today you were seen for cough.  Please cut down on smoking. Please take 5 ml of Guaefenezine-Codeine syrup at night or 3 times daily as needed for cough. (I changed the pharmacy from [facility] to [facility] per your request). Be aware that this medication may make you drowsy so do not drive when you take it.  Take Mucinex once or twice a day too loosen up the mucus.     Please follow-up in clinic as needed.    Should you have any questions or concerns please call the internal medicine clinic at [phone].

## 2019-12-08 NOTE — Assessment & Plan Note (Signed)
BP today is well controlled at 126/73. -Continue Amlodipine 10 mg QD

## 2019-12-08 NOTE — Progress Notes (Signed)
   CC: cough  HPI:  [name] is a 74 y.o. female with PMHx as documented below, presented with cough. Please refer to problem based charting for further details and assessment and plan of current problem and chronic medical conditions.  PMHx: HTN, GERD, Depression, low back pain  Medications: ASA, Amlodipine, Gabapentin, Metoprolol, Rosuvastatin, Myrbetric  Past Medical History:  Diagnosis Date  . Chronic back pain    "mostly lower; left foot is numb all the time" (11/22/2016)  . Chronic kidney disease    ?renal cyst on MRI   . GERD (gastroesophageal reflux disease)   . High cholesterol   . Lymphadenopathy    documented on chest CT scan 12/15/2005  . Substance abuse ([place])    tobacco   Review of Systems:  Constitutional: Negative for chills and fever.  Respiratory: Negative for shortness of breath.   Cardiovascular: Negative for chest pain and leg swelling.  Gastrointestinal: Negative for abdominal pain, nausea and vomiting.  Neurological: Negative for dizziness and headaches.    Physical Exam:  Vitals:   12/08/19 1504  BP: 126/73  Pulse: 91  Temp: 98.3 F (36.8 C)  TempSrc: Oral  SpO2: 98%  Weight: 214 lb 8 oz (97.3 kg)  Height: 5\' 6"  (1.676 m)   Physical Exam  Constitutional: She does not appear ill. No distress.  HENT:  Mouth/Throat: Mucous membranes are moist. No oropharyngeal exudate or posterior oropharyngeal erythema. Oropharynx is clear.  Cardiovascular: Normal rate and regular rhythm.  No murmur heard. Respiratory: She has no wheezes. She has no rales.  GI: Soft. There is no abdominal tenderness.  Musculoskeletal:     Right lower leg: No edema.     Left lower leg: No edema.  Neurological: She is alert.  Psychiatric: Her behavior is normal. Mood normal.    Assessment & Plan:    Patient discussed with Dr. Evette Doffing

## 2019-12-09 ENCOUNTER — Encounter: Payer: Self-pay | Admitting: Internal Medicine

## 2019-12-09 DIAGNOSIS — M545 Low back pain: Secondary | ICD-10-CM | POA: Diagnosis not present

## 2019-12-09 DIAGNOSIS — N189 Chronic kidney disease, unspecified: Secondary | ICD-10-CM | POA: Diagnosis not present

## 2019-12-09 DIAGNOSIS — R0982 Postnasal drip: Secondary | ICD-10-CM | POA: Insufficient documentation

## 2019-12-09 LAB — BMP8+ANION GAP
Anion Gap: 12 mmol/L (ref 10.0–18.0)
BUN/Creatinine Ratio: 24 (ref 12–28)
BUN: 18 mg/dL (ref 8–27)
CO2: 24 mmol/L (ref 20–29)
Calcium: 9.4 mg/dL (ref 8.7–10.3)
Chloride: 106 mmol/L (ref 96–106)
Creatinine, Ser: 0.76 mg/dL (ref 0.57–1.00)
GFR calc Af Amer: 91 mL/min/{1.73_m2} (ref >59)
GFR calc non Af Amer: 79 mL/min/{1.73_m2} (ref >59)
Glucose: 84 mg/dL (ref 65–99)
Potassium: 4.5 mmol/L (ref 3.5–5.2)
Sodium: 142 mmol/L (ref 134–144)

## 2019-12-09 LAB — LIPID PANEL
Chol/HDL Ratio: 3.3 ratio (ref 0.0–4.4)
Cholesterol, Total: 119 mg/dL (ref 100–199)
HDL: 36 mg/dL — ABNORMAL LOW (ref >39)
LDL Chol Calc (NIH): 66 mg/dL (ref 0–99)
Triglycerides: 89 mg/dL (ref 0–149)
VLDL Cholesterol Cal: 17 mg/dL (ref 5–40)

## 2019-12-09 NOTE — Assessment & Plan Note (Signed)
Patient has been off of H2 blocker and PPI.  She has cough after eating meals.  -Protonix 40 mg with dinner

## 2019-12-09 NOTE — Assessment & Plan Note (Signed)
Patient asks for refill of (opioid) pain medications. She was seen in pain clinic before but mentions. Recommend her to talk to the provider at pain clinic regarding refills.

## 2019-12-09 NOTE — Assessment & Plan Note (Signed)
Patient presented with cough. It has been going on for the past 2 weeks. It is associated with yellow mucus. It is worse at night and when she sleeps and also when she wake up at morning. She had a tele health visit for that and was seen at urgent care and prescribed some cough medicine.   No runny nose and no congestion. No fever or chills. Reports post nasal drip. She feels that the phlegm comes from her throat not her lungs. She feels better during the day. She still smokes 5-6 cigarettes a day.   Her cough is likely multifactorial. 2/2 post nasal drip and GERD. -Will prescribe Protonix and PRN guaifenesin PRN. (She had a script sent to [facility] outpatient pharmacy that she did not pick up. I talked to the pharmacy to cancel the script and resent [facility] to [facility] per patient request -Counseled to cut down smoking

## 2019-12-10 NOTE — Progress Notes (Signed)
Internal Medicine Clinic Attending  Case discussed with Dr. Masoudi  at the time of the visit.  We reviewed the resident's history and exam and pertinent patient test results.  I agree with the assessment, diagnosis, and plan of care documented in the resident's note.  

## 2019-12-21 ENCOUNTER — Other Ambulatory Visit: Payer: Self-pay | Admitting: Internal Medicine

## 2019-12-21 DIAGNOSIS — N3946 Mixed incontinence: Secondary | ICD-10-CM

## 2019-12-30 ENCOUNTER — Other Ambulatory Visit: Payer: Self-pay | Admitting: Internal Medicine

## 2019-12-30 DIAGNOSIS — K219 Gastro-esophageal reflux disease without esophagitis: Secondary | ICD-10-CM

## 2020-01-14 ENCOUNTER — Other Ambulatory Visit: Payer: Self-pay

## 2020-01-14 ENCOUNTER — Ambulatory Visit (HOSPITAL_COMMUNITY)
Admission: EM | Admit: 2020-01-14 | Discharge: 2020-01-14 | Disposition: A | Discharge status: Home / Self Care | Payer: Medicare Other | Attending: Emergency Medicine | Admitting: Emergency Medicine

## 2020-01-14 ENCOUNTER — Encounter (HOSPITAL_COMMUNITY): Payer: Self-pay

## 2020-01-14 DIAGNOSIS — R05 Cough: Secondary | ICD-10-CM

## 2020-01-14 DIAGNOSIS — R059 Cough, unspecified: Secondary | ICD-10-CM

## 2020-01-14 DIAGNOSIS — Z20822 Contact with and (suspected) exposure to covid-19: Secondary | ICD-10-CM | POA: Insufficient documentation

## 2020-01-14 MED ORDER — BENZONATATE 200 MG PO CAPS: 200.0000 mg | ORAL_CAPSULE | Freq: Three times a day (TID) | ORAL | 0 refills | Status: AC | PRN

## 2020-01-14 NOTE — Discharge Instructions (Signed)
Monitor MyChart for results

## 2020-01-14 NOTE — ED Provider Notes (Signed)
[facility]    CSN: [id number] Arrival date & time: 01/14/20  1750      History   Chief Complaint Chief Complaint  Patient presents with   Covid Exposure    HPI Laura [name] is a 74 y.o. female history of tobacco use, hypertension, presenting today for Covid testing after exposure.  Reports exposure back in early June, denies recent exposure.  Planning on going to new provider on Monday and requiring Covid test prior to visit.  Patient does report a cough, but this is been going on for approximately 1 month.  She does endorse tobacco use approximately 5 to 6cigarettes/day.  She does report some occasional wheezing at nighttime.  Does have an albuterol inhaler, but does not use this regularly.  Denies any worsening of symptoms or cough.  Denies fevers.  Denies other URI symptoms of congestion or sore throat.  HPI  Past Medical History:  Diagnosis Date   Chronic back pain    "mostly lower; left foot is numb all the time" (11/22/2016)   Chronic kidney disease    ?renal cyst on MRI    GERD (gastroesophageal reflux disease)    High cholesterol    Lymphadenopathy    documented on chest CT scan 12/15/2005   Substance abuse ([place])    tobacco    Patient Active Problem List   Diagnosis Date Noted   PND (post-nasal drip) 12/09/2019   Encounter for screening mammogram for malignant neoplasm of breast 10/20/2019   Risk for falls 03/11/2018   Abnormal finding 01/15/2018   Open-angle glaucoma 11/15/2017   Cramping of hands 11/15/2017   Plantar wart 08/06/2017   Mass of right ear canal 07/04/2017   Hyperglycemia 05/02/2017   Screening for osteoporosis 04/06/2017   Bronchitis due to tobacco use 11/22/2016   Left hand pain 04/16/2014   Chest pain 03/16/2014   Mixed incontinence urge and stress 10/06/2013   Cough 08/04/2013   HTN (hypertension) 08/04/2013   Health care maintenance 12/31/2012   Post laminectomy syndrome 01/30/2012   Lumbago  01/30/2012   Viral URI 05/31/2011   Back muscle spasm 05/31/2011   GERD (gastroesophageal reflux disease) 01/05/2011   HLD (hyperlipidemia) 05/18/2010   DEPRESSION 11/25/2009   Low back pain with radiation 04/16/2009   TOBACCO ABUSE 03/12/2009   PERIPHERAL NEUROPATHY, LOWER EXTREMITY, LEFT 03/12/2009   Allergic rhinitis 10/09/2007   CARPAL TUNNEL SYNDROME, LEFT 09/12/2007    Past Surgical History:  Procedure Laterality Date   ANTERIOR CERVICAL DISCECTOMY     C3-4, C4-5 diskectomy, fusion, plating and allograft,    BACK SURGERY     CARPAL TUNNEL WITH CUBITAL TUNNEL Left    LUMBAR DISC SURGERY  X 2-3   POSTERIOR CERVICAL FUSION/FORAMINOTOMY     C3-5 fusion with wirin   POSTERIOR LUMBAR FUSION     SPINE SURGERY     s/p C3-4, C4-5 diskectomy, fusion, plating and allograft, s/p posterior C3-5 fusion with wiring(02/11/2007 by Dr Lorin Mercy) had psudoarthrosis lumbar surgery 2000)    OB History   No obstetric history on file.      Home Medications    Prior to Admission medications   Medication Sig Start Date End Date Taking? Authorizing Provider  amLODipine (NORVASC) 10 MG tablet Take 1 tablet (10 mg total) by mouth daily. 03/11/18 01/14/20 Yes Katherine Roan, MD  aspirin 81 MG chewable tablet Chew 81 mg by mouth daily.   Yes [provider]  cetirizine (ZYRTEC) 10 MG tablet TAKE 1 TABLET  BY MOUTH EVERY DAY 01/21/19  Yes Katherine Roan, MD  fluticasone (FLONASE) 50 MCG/ACT nasal spray PLACE 1-2 SPRAYS INTO BOTH NOSTRILS DAILY. 11/25/19  Yes Katherine Roan, MD  gabapentin (NEURONTIN) 600 MG tablet TAKE 1 TABLET (600 MG TOTAL) BY MOUTH 4 (FOUR) TIMES DAILY. 09/08/19 09/02/20 Yes Katherine Roan, MD  HYDROcodone-acetaminophen (NORCO) 10-325 MG tablet Take 1 tablet by mouth 2 (two) times daily. [facility]   Yes [provider]  Lifitegrast Shirley Friar) 5 % SOLN Apply 1 drop to eye daily. One drop in both eyes   Yes [provider]  metoprolol succinate (TOPROL-XL) 25 MG 24 hr tablet Take 25 mg by mouth daily. 08/14/19  Yes [provider]  MYRBETRIQ 25 MG TB24 tablet TAKE 1 TABLET BY MOUTH EVERY DAY 12/23/19  Yes Marianna Payment, MD  pantoprazole (PROTONIX) 40 MG tablet Take 1 tablet (40 mg total) by mouth daily. 12/08/19 12/07/20 Yes Masoudi, Elhamalsadat, MD  rosuvastatin (CRESTOR) 20 MG tablet TAKE 1 TABLET (20 MG TOTAL) BY MOUTH DAILY AT 6 PM. 12/08/19  Yes Winfrey, Jenne Pane, MD  tiZANidine (ZANAFLEX) 4 MG tablet Take 1 tablet (4 mg total) by mouth every 8 (eight) hours as needed for muscle spasms. This is a 30 day supply 06/14/18  Yes Winfrey, Jenne Pane, MD  benzonatate (TESSALON) 200 MG capsule Take 1 capsule (200 mg total) by mouth 3 (three) times daily as needed for up to 7 days for cough. 01/14/20 01/21/20  Shelley Pooley C, PA-C  albuterol (VENTOLIN HFA) 108 (90 Base) MCG/ACT inhaler Inhale 1-2 puffs into the lungs every 6 (six) hours as needed for wheezing or shortness of breath. 11/29/19 11/29/19  Orvan July, NP    Family History Family History  Problem Relation Age of Onset   Diabetes Father    Hypertension Father     Social History Social History   Tobacco Use   Smoking status: Current Every Day Smoker    Packs/day: 0.50    Years: 47.00    Pack years: 23.50    Types: Cigarettes   Smokeless tobacco: Never Used   Tobacco comment: 4-5 cigs/day  Vaping Use   Vaping Use: Never used  Substance Use Topics   Alcohol use: Yes    Alcohol/week: 0.0 standard drinks    Comment: 11/22/2016 "2 drinks q other holiday"   Drug use: Not Currently    Types: Marijuana    Comment: 66/2018 "I stopped in the 1970s"     Allergies   Patient has no known allergies.   Review of Systems Review of Systems  Constitutional: Negative for activity change, appetite change, chills, fatigue and fever.  HENT: Negative for congestion, ear pain, rhinorrhea, sinus pressure, sore throat and trouble  swallowing.   Eyes: Negative for discharge and redness.  Respiratory: Negative for cough, chest tightness and shortness of breath.   Cardiovascular: Negative for chest pain.  Gastrointestinal: Negative for abdominal pain, diarrhea, nausea and vomiting.  Musculoskeletal: Negative for myalgias.  Skin: Negative for rash.  Neurological: Negative for dizziness, light-headedness and headaches.     Physical Exam Triage Vital Signs ED Triage Vitals  Enc Vitals Group     BP      Pulse      Resp      Temp      Temp src      SpO2      Weight      Height  Head Circumference      Peak Flow      Pain Score      Pain Loc      Pain Edu?      Excl. in [place]?    No data found.  Updated Vital Signs Pulse 84    Temp 98.1 F (36.7 C) (Oral)    Resp 18    SpO2 98%   Visual Acuity Right Eye Distance:   Left Eye Distance:   Bilateral Distance:    Right Eye Near:   Left Eye Near:    Bilateral Near:     Physical Exam Vitals and nursing note reviewed.  Constitutional:      Appearance: She is well-developed.     Comments: No acute distress  HENT:     Head: Normocephalic and atraumatic.     Nose: Nose normal.  Eyes:     Conjunctiva/sclera: Conjunctivae normal.  Cardiovascular:     Rate and Rhythm: Normal rate.  Pulmonary:     Effort: Pulmonary effort is normal. No respiratory distress.     Comments: Breathing comfortably at rest, CTABL, no wheezing, rales or other adventitious sounds auscultated Abdominal:     General: There is no distension.  Musculoskeletal:        General: Normal range of motion.     Cervical back: Neck supple.  Skin:    General: Skin is warm and dry.  Neurological:     Mental Status: She is alert and oriented to person, place, and time.      UC Treatments / Results  Labs (all labs ordered are listed, but only abnormal results are displayed) Labs Reviewed  SARS CORONAVIRUS 2 (TAT 6-24 HRS)    EKG   Radiology No results  found.  Procedures Procedures (including critical care time)  Medications Ordered in UC Medications - No data to display  Initial Impression / Assessment and Plan / UC Course  I have reviewed the triage vital signs and the nursing notes.  Pertinent labs & imaging results that were available during my care of the patient were reviewed by me and considered in my medical decision making (see chart for details).     Covid test pending, monitor my chart for results.  Provide Tessalon to use as needed for cough.  Cough likely chronic in nature and secondary to smoking.  Advised to try to cut back on smoking.  Follow-up if cough ever changing or worsening, developing issues with breathing.  Discussed strict return precautions. Patient verbalized understanding and is agreeable with plan.  Final Clinical Impressions(s) / UC Diagnoses   Final diagnoses:  Encounter for laboratory testing for COVID-19 virus  Cough     Discharge Instructions     Monitor MyChart for results    ED Prescriptions    Medication Sig Dispense Auth. Provider   benzonatate (TESSALON) 200 MG capsule Take 1 capsule (200 mg total) by mouth 3 (three) times daily as needed for up to 7 days for cough. 28 capsule Quinnie Barcelo, Moquino C, PA-C     PDMP not reviewed this encounter.   Janith Lima, [place] 01/14/20 1900

## 2020-01-14 NOTE — ED Triage Notes (Signed)
Pt presents for COVID test.  States she is going to a new provider on Monday and they require a COVID test.   Pt has been exposed to [place] (early June).  Pt does have cough but has been present for over a month and has since tested negative.

## 2020-01-15 LAB — SARS CORONAVIRUS 2 (TAT 6-24 HRS): SARS Coronavirus 2: NEGATIVE

## 2020-01-27 ENCOUNTER — Other Ambulatory Visit: Payer: Self-pay | Admitting: Internal Medicine

## 2020-01-27 DIAGNOSIS — K219 Gastro-esophageal reflux disease without esophagitis: Secondary | ICD-10-CM

## 2020-01-27 NOTE — Telephone Encounter (Signed)
Please advise the patient to return to clinic if her cough has failed to improve with the Protonix, or if it is worsening. Otherwise, would like her to follow up in about 6 weeks.

## 2020-02-18 NOTE — Telephone Encounter (Signed)
View meds

## 2020-02-20 ENCOUNTER — Telehealth: Payer: Self-pay | Admitting: *Deleted

## 2020-02-20 NOTE — Telephone Encounter (Signed)
Patient has some questions about what she should do as she had a COVID exposure.

## 2020-02-20 NOTE — Telephone Encounter (Signed)
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 It was on Wed when one of the plant manager told her she was positive. Denies any covid symptoms except for a cough and congestion. Instructed to get tested, stay quarantine until she get her results (lives alone), stay hydrated, good handwashing. Understands - stated she had been tested before. And to call if dev new/worsen symptoms.

## 2020-02-23 ENCOUNTER — Other Ambulatory Visit: Payer: Self-pay | Admitting: Student

## 2020-02-23 DIAGNOSIS — K219 Gastro-esophageal reflux disease without esophagitis: Secondary | ICD-10-CM

## 2020-02-24 ENCOUNTER — Other Ambulatory Visit: Payer: Self-pay | Admitting: Internal Medicine

## 2020-02-24 DIAGNOSIS — N3946 Mixed incontinence: Secondary | ICD-10-CM

## 2020-03-01 ENCOUNTER — Ambulatory Visit (HOSPITAL_COMMUNITY)
Admission: EM | Admit: 2020-03-01 | Discharge: 2020-03-01 | Discharge status: Against Medical Advice | Payer: Medicare Other

## 2020-03-01 ENCOUNTER — Other Ambulatory Visit: Payer: Self-pay

## 2020-03-04 ENCOUNTER — Encounter: Payer: Self-pay | Admitting: Student

## 2020-03-04 ENCOUNTER — Ambulatory Visit (INDEPENDENT_AMBULATORY_CARE_PROVIDER_SITE_OTHER): Payer: Medicare Other | Admitting: Student

## 2020-03-04 VITALS — BP 132/83 | HR 106 | Temp 98.4°F | Wt 206.9 lb

## 2020-03-04 DIAGNOSIS — F17210 Nicotine dependence, cigarettes, uncomplicated: Secondary | ICD-10-CM | POA: Diagnosis not present

## 2020-03-04 DIAGNOSIS — R05 Cough: Secondary | ICD-10-CM | POA: Diagnosis not present

## 2020-03-04 DIAGNOSIS — R918 Other nonspecific abnormal finding of lung field: Secondary | ICD-10-CM | POA: Diagnosis not present

## 2020-03-04 DIAGNOSIS — I10 Essential (primary) hypertension: Secondary | ICD-10-CM | POA: Diagnosis not present

## 2020-03-04 DIAGNOSIS — Z720 Tobacco use: Secondary | ICD-10-CM

## 2020-03-04 DIAGNOSIS — R053 Chronic cough: Secondary | ICD-10-CM

## 2020-03-04 MED ORDER — AMLODIPINE BESYLATE 10 MG PO TABS: 10.0000 mg | ORAL_TABLET | Freq: Every day | ORAL | 6 refills | Status: DC

## 2020-03-04 MED ORDER — ALBUTEROL SULFATE HFA 108 (90 BASE) MCG/ACT IN AERS: 1.0000 | INHALATION_SPRAY | Freq: Four times a day (QID) | RESPIRATORY_TRACT | 0 refills | Status: DC | PRN

## 2020-03-04 NOTE — Assessment & Plan Note (Signed)
Patient has presented to our clinic and ED multiple times for episodes of worsening cough and dyspnea. She states that her cough has improved since her last visit and is now at baseline. She states that she coughs predominantly at night and early in the morning. She states that using the albuterol inhaler helps, while laying on her right side worsens the cough. She states that the cough is not usually productive. She reports not having much shortness of breath, however she has had family/friends comment on her being wheezy or short of breath after minimal exertion. She had mild COPD/emphysema seen on CT in 2016 without further workup. She is interested in getting a repeat CT scan and PFTs to workup underlying COPD. -CT Chest -PFTs -Refilled albuterol inhaler -Counseled on smoking cessation -If consistent with COPD, consider starting spiriva

## 2020-03-04 NOTE — Assessment & Plan Note (Signed)
Patient had CT Chest in 2016 which revealed multiple pulmonary nodules (39mm nodule in left lower lobe as well as 38mm nodule in right upper lobe). She had repeat CT chest scheduled in 6-12 months, however this appointment was cancelled. She is interested in getting repeat imaging done given her chronic cough, wheezing, and occasional shortness of breath. -CT Chest to follow-up pulmonary nodules

## 2020-03-04 NOTE — Progress Notes (Signed)
   CC: Follow-up cough  HPI:  Laura [name] is a 74 y.o. with past medical history significant for HTN, HLD, tobacco abuse, and GERD who presents for re-evaluation of cough. Please, refer to patient's problem list for Assessment & Plan based charting of patient's chronic medical conditions and any acute complaints addressed at this visit.  Past Medical History:  Diagnosis Date  . Chronic back pain    "mostly lower; left foot is numb all the time" (11/22/2016)  . Chronic kidney disease    ?renal cyst on MRI   . GERD (gastroesophageal reflux disease)   . High cholesterol   . Lymphadenopathy    documented on chest CT scan 12/15/2005  . Substance abuse ([place])    tobacco   Family History  Problem Relation Age of Onset  . Diabetes Father   . Hypertension Father    Social History   Tobacco Use  . Smoking status: Current Every Day Smoker    Packs/day: 0.50    Years: 47.00    Pack years: 23.50    Types: Cigarettes  . Smokeless tobacco: Never Used  . Tobacco comment: 4-5 cigs/day  Vaping Use  . Vaping Use: Never used  Substance Use Topics  . Alcohol use: Yes    Alcohol/week: 0.0 standard drinks    Comment: 11/22/2016 "2 drinks q other holiday"  . Drug use: Not Currently    Types: Marijuana    Comment: 66/2018 "I stopped in the 1970s"   Review of Systems:  Endorses chronic cough, ocassional wheezing, and some shortness of breath. Denies fevers, chills, sore throat, headaches, nausea, vomiting, chest pain.  Physical Exam:  Vitals:   03/04/20 1327  BP: 132/83  Pulse: (!) 106  SpO2: 96%   Physical Exam Constitutional:      Appearance: She is obese.  HENT:     Head: Normocephalic and atraumatic.     Mouth/Throat:     Mouth: Mucous membranes are moist.     Pharynx: Oropharynx is clear.  Eyes:     Extraocular Movements: Extraocular movements intact.     Conjunctiva/sclera: Conjunctivae normal.  Cardiovascular:     Rate and Rhythm: Normal rate and regular rhythm.      Pulses: Normal pulses.     Heart sounds: Normal heart sounds.  Pulmonary:     Effort: Pulmonary effort is normal. No respiratory distress.     Breath sounds: No stridor. Wheezing present.  Abdominal:     General: Abdomen is flat. Bowel sounds are normal.     Palpations: Abdomen is soft.  Musculoskeletal:        General: Normal range of motion.     Cervical back: Normal range of motion and neck supple.  Skin:    General: Skin is warm and dry.     Capillary Refill: Capillary refill takes less than 2 seconds.  Neurological:     General: No focal deficit present.     Mental Status: She is alert. Mental status is at baseline.  Psychiatric:        Mood and Affect: Mood normal.        Behavior: Behavior normal.        Thought Content: Thought content normal.        Judgment: Judgment normal.    Assessment & Plan:   See Encounters Tab for problem based charting.  Patient seen with Dr. Daryll Drown

## 2020-03-04 NOTE — Assessment & Plan Note (Addendum)
BP today 132/83. She takes amlodipine 10mg  daily which is prescribed through our clinic as well as metoprolol 25mg  daily which is prescribed by a cardiologist, Dr. Lyla Son.  -Continue current medication regimen (amlodipine 10mg  and metoprolol 25mg  daily)  ADDENDUM: Patient taking 2.5mg  amlodipine and 25mg  metoprolol daily, contrary to prior documentation. Dr. Lyla Son, patient's cardiologist, will continue to refill these medications. Patient has been advised to take medications according to cardiologist's recommendations.

## 2020-03-04 NOTE — Assessment & Plan Note (Signed)
Patient continues to smoke approximately 5-6 cigarettes per day which she has done for approximately forty years. She has attempted using nicotine replacement therapy in the past without success. She is not interested in varenicline due to concern of side effects. She wants to cut down, but it is difficult for her. I advised her that her wheezing, shortness of breath and cough would all likely improve with complete cessation of tobacco use. -Continue counseling smoking cessation

## 2020-03-04 NOTE — Patient Instructions (Signed)
Dear Ms. Adolf,   It was a pleasure meeting you in clinic today. I have refilled your amlodipine for your blood pressure and the albuterol inhaler to help as needed for trouble breathing or worsening cough. Also, I have ordered a CT scan to follow-up on your pulmonary nodules that were seen in 2016 as well as PFTs to check for underlying COPD. Otherwise, I look forward to seeing you at our next appointment. Please, call us or present to the ED if you have any worsening symptoms.  Sincerely, Foy Guadalajara, MD

## 2020-03-10 ENCOUNTER — Telehealth: Payer: Self-pay

## 2020-03-10 NOTE — Telephone Encounter (Signed)
RTC, Patient states she saw Dr. Wynetta Emery on 03/04/20 and he told her that her amlodipine was about to expire so he sent in another RX for amlodipine.  Patient picked up RX and states amlodipine 10mg  was sent in, but she has been taking amlodipine 2.5mg . Per chart review, this RN can only find RX's for amlodipine 10mg  02/2018 and before this, amlodipine 5mg  which was verbalized to patient.  Patient then states that maybe her cardiologist was giving her the amlodipine 2.5mg . [facility], spoke to pharmacist who informed RN patient has only filled Amlodipine 2.5mg  from Dr. Terrence Dupont (Cardiologist) for past several years.   TC to patient and she was informed of above.  Patient states she now remembers her Cardiologist decreased her dose of amlodipine to 2.5mg  sometime in the past. Patient states she has plenty of 2.5mg  tablets left. Pt instructed that she should continue to receive refills of the amlodipine from her cardiologist in the future since he has adjusted her dose.  Also instructed not to take the 10mg  tablets, She verbalized understanding. SChaplin, RN,BSN

## 2020-03-10 NOTE — Telephone Encounter (Signed)
Requesting to speak with a nurse about meds, please call pt back.  

## 2020-03-10 NOTE — Progress Notes (Signed)
Internal Medicine Clinic Attending  I saw and evaluated the patient.  I personally confirmed the key portions of the history and exam documented by Dr. Johnson and I reviewed pertinent patient test results.  The assessment, diagnosis, and plan were formulated together and I agree with the documentation in the resident's note.  

## 2020-03-10 NOTE — Telephone Encounter (Signed)
Reviewed encounter. Will defer future prescriptions of amlodipine to patient's cardiologist. Thank you!

## 2020-03-11 NOTE — Telephone Encounter (Signed)
Pt rtn call with information about the Her medications and would like for a nurse to call back to help her.

## 2020-03-11 NOTE — Telephone Encounter (Signed)
RTC, patient states she called her Cardiologist today and he confirmed that he decreased the amlodipine to 2.5mg  daily.  Patient also states that the Cardiologist states he will be prescribing the amlodipine and metoptolol for patient. All remaining refills for Amlodipine 10mg  dose were cancelled at pharmacy. SChaplin, RN,BSN

## 2020-03-17 ENCOUNTER — Other Ambulatory Visit: Payer: Self-pay | Admitting: Student

## 2020-03-26 ENCOUNTER — Other Ambulatory Visit: Payer: Self-pay | Admitting: Student

## 2020-03-26 DIAGNOSIS — K219 Gastro-esophageal reflux disease without esophagitis: Secondary | ICD-10-CM

## 2020-03-30 ENCOUNTER — Other Ambulatory Visit: Payer: Self-pay | Admitting: Student

## 2020-03-31 ENCOUNTER — Other Ambulatory Visit: Payer: Self-pay

## 2020-03-31 ENCOUNTER — Ambulatory Visit
Admission: RE | Admit: 2020-03-31 | Discharge: 2020-03-31 | Disposition: A | Discharge status: Home / Self Care | Payer: Medicare Other | Source: Ambulatory Visit | Attending: Internal Medicine | Admitting: Internal Medicine

## 2020-03-31 DIAGNOSIS — Z1231 Encounter for screening mammogram for malignant neoplasm of breast: Secondary | ICD-10-CM

## 2020-04-23 ENCOUNTER — Other Ambulatory Visit: Payer: Self-pay | Admitting: Internal Medicine

## 2020-04-23 DIAGNOSIS — K219 Gastro-esophageal reflux disease without esophagitis: Secondary | ICD-10-CM

## 2020-05-22 ENCOUNTER — Ambulatory Visit: Payer: Medicare Other

## 2020-06-25 ENCOUNTER — Other Ambulatory Visit: Payer: Self-pay

## 2020-06-25 DIAGNOSIS — R0981 Nasal congestion: Secondary | ICD-10-CM

## 2020-06-25 MED ORDER — FLUTICASONE PROPIONATE 50 MCG/ACT NA SUSP: 1.0000 | Freq: Every day | NASAL | 1 refills | Status: DC

## 2020-07-08 ENCOUNTER — Other Ambulatory Visit: Payer: Self-pay | Admitting: Student

## 2020-07-08 DIAGNOSIS — R0981 Nasal congestion: Secondary | ICD-10-CM

## 2020-07-26 ENCOUNTER — Other Ambulatory Visit: Payer: Self-pay

## 2020-07-26 MED ORDER — GABAPENTIN 600 MG PO TABS: 600.0000 mg | ORAL_TABLET | Freq: Four times a day (QID) | ORAL | 1 refills | Status: DC

## 2020-07-26 NOTE — Telephone Encounter (Signed)
Need refill on gabapentin (NEURONTIN) 600 MG tablet  pt contact [phone]   [id number] [place], [place] [address]

## 2020-07-26 NOTE — Telephone Encounter (Signed)
gabapentin (NEURONTIN) 600 MG tablet, refill request @  [id number] - [place], [place] [address] Phone:  [phone]  Fax:  [phone]     Pt would like this med to be filled by today. Please call pt back.

## 2020-08-05 ENCOUNTER — Other Ambulatory Visit: Payer: Self-pay

## 2020-08-05 DIAGNOSIS — N3946 Mixed incontinence: Secondary | ICD-10-CM

## 2020-08-05 MED ORDER — MIRABEGRON ER 25 MG PO TB24: 25.0000 mg | ORAL_TABLET | Freq: Every day | ORAL | 1 refills | Status: DC

## 2020-08-05 NOTE — Telephone Encounter (Signed)
MYRBETRIQ 25 MG TB24 tablet, refill request @  [id number] - [place], [place] - [address] Phone:  [phone]  Fax:  [phone]

## 2020-08-11 ENCOUNTER — Ambulatory Visit (INDEPENDENT_AMBULATORY_CARE_PROVIDER_SITE_OTHER): Payer: Medicare Other | Admitting: Student

## 2020-08-11 ENCOUNTER — Ambulatory Visit (HOSPITAL_COMMUNITY)
Admission: RE | Admit: 2020-08-11 | Discharge: 2020-08-11 | Disposition: A | Discharge status: Home / Self Care | Payer: Medicare Other | Source: Ambulatory Visit | Attending: Internal Medicine | Admitting: Internal Medicine

## 2020-08-11 ENCOUNTER — Other Ambulatory Visit: Payer: Self-pay

## 2020-08-11 ENCOUNTER — Encounter: Payer: Self-pay | Admitting: Student

## 2020-08-11 VITALS — BP 112/70 | HR 90 | Temp 98.7°F | Ht 66.0 in | Wt 203.1 lb

## 2020-08-11 DIAGNOSIS — J069 Acute upper respiratory infection, unspecified: Secondary | ICD-10-CM | POA: Diagnosis not present

## 2020-08-11 DIAGNOSIS — R058 Other specified cough: Secondary | ICD-10-CM

## 2020-08-11 HISTORY — DX: Acute upper respiratory infection, unspecified: J06.9

## 2020-08-11 LAB — BASIC METABOLIC PANEL
Anion gap: 10 (ref 5–15)
BUN: 15 mg/dL (ref 8–23)
CO2: 22 mmol/L (ref 22–32)
Calcium: 9 mg/dL (ref 8.9–10.3)
Chloride: 106 mmol/L (ref 98–111)
Creatinine, Ser: 0.77 mg/dL (ref 0.44–1.00)
GFR, Estimated: >60 mL/min (ref >60)
Glucose, Bld: 119 mg/dL — ABNORMAL HIGH (ref 70–99)
Potassium: 4.1 mmol/L (ref 3.5–5.1)
Sodium: 138 mmol/L (ref 135–145)

## 2020-08-11 LAB — CBC WITH DIFFERENTIAL/PLATELET
Abs Immature Granulocytes: 0.03 10*3/uL (ref 0.00–0.07)
Basophils Absolute: 0.1 10*3/uL (ref 0.0–0.1)
Basophils Relative: 1 %
Eosinophils Absolute: 0.5 10*3/uL (ref 0.0–0.5)
Eosinophils Relative: 6 %
HCT: 42.8 % (ref 36.0–46.0)
Hemoglobin: 13.6 g/dL (ref 12.0–15.0)
Immature Granulocytes: 0 %
Lymphocytes Relative: 32 %
Lymphs Abs: 2.6 10*3/uL (ref 0.7–4.0)
MCH: 30.9 pg (ref 26.0–34.0)
MCHC: 31.8 g/dL (ref 30.0–36.0)
MCV: 97.3 fL (ref 80.0–100.0)
Monocytes Absolute: 0.5 10*3/uL (ref 0.1–1.0)
Monocytes Relative: 7 %
Neutro Abs: 4.3 10*3/uL (ref 1.7–7.7)
Neutrophils Relative %: 54 %
Platelets: 269 10*3/uL (ref 150–400)
RBC: 4.4 MIL/uL (ref 3.87–5.11)
RDW: 14.2 % (ref 11.5–15.5)
WBC: 8 10*3/uL (ref 4.0–10.5)
nRBC: 0 % (ref 0.0–0.2)

## 2020-08-11 NOTE — Patient Instructions (Signed)
Ms. Pharo,  It was a pleasure seeing you again today in clinic.  For your viral upper respiratory tract infection: We recommend conservative management of this condition at home. You may take any over-the-counter medications that provide you with relief. Some medications that may be helpful include Robitussin, Mucinex or cough drops. If your symptoms worsen or you develop shortness of breath or chest pain please contact our clinic immediately and present to the emergency department. Otherwise, this infection should resolve over the coming days.  We do recommend that you schedule a routine follow-up to check in on your chronic medical conditions within the next few weeks.  Sincerely, Dr. Paulla Dolly, MD

## 2020-08-11 NOTE — Assessment & Plan Note (Addendum)
SUBJECTIVE [name]. [name] is a 75 year old female with past medical history significant for chronic cough, history of tobacco use disorder (approximately 25-pack-year history), multiple pulmonary nodules, COPD on CT Chest no PFTS, and GERD who presents to clinic for evaluation of productive cough.  Patient reports that since Friday she has been "coughing nonstop." She reports that her cough initially was productive of solid white sputum and has progressed to a brown color. She endorses associated symptoms of shortness of breath with exertion, fatigue, chills, myalgias, sneezing and congestion. Additionally, she endorses pain in her right side upon coughing which started two days ago. She denies sick contacts. She has received two doses of Pfizer vaccine. She reports that overall she feels mildly improved from the weekend.  OBJECTIVE: Vitals: BP 112/70  HR 90  Temp 98.7  SpO2 98% on room air  Physical exam: -General: Ill-appearing, elderly female sitting in wheelchair frequently coughing. -Pulmonary: Lungs clear to auscultation bilaterally with no wheezing or rales -Cardiac: Regular rate and rhythm with no murmurs, rubs or gallops  ASSESSMENT/PLAN: Laura [name] is a 75 year old female with past medical history significant for chronic cough, history of tobacco use disorder and multiple pulmonary nodules who presents to clinic with five day history of productive cough, chills, fatigue, myalgias, sneezing and congestion concerning for viral URI, COVID-19 pneumonia versus bacterial pneumonia.  -Stat CBC with Diff -Stat BMP -Stat CXR -COVID swab ([facility])  ADDENDUM: Patient's CBC and BMP unremarkable. CXR revealing for chronic bronchitic changes with no acute abnormality. Patient's symptoms most concerning for viral upper respiratory tract infection. Patient stable for discharge home with return precautions if her symptoms were to worsen of she develop shortness of breath. Patient understands  and agrees with the plan. She is advised to use over-the-counter medications such as Robitussin or Mucinex to help with her symptoms and call if she has any questions or concerns. Advised patient to schedule follow-up appointment within the next few weeks for checkup of her chronic medical conditions as her last office visit was in September. Of note, patient has not underwent pulmonary function testing of CT Chest to follow-up pulmonary nodules since last office visit.

## 2020-08-11 NOTE — Progress Notes (Signed)
   CC: Productive cough  HPI:  [name] is a 76 y.o. female with past medical history significant for chronic cough, history of tobacco use disorder, multiple pulmonary nodules and GERD who presents to clinic for productive cough. Refer to problem list for charting of this encounter.  Past Medical History:  Diagnosis Date  . Chronic back pain    "mostly lower; left foot is numb all the time" (11/22/2016)  . Chronic kidney disease    ?renal cyst on MRI   . GERD (gastroesophageal reflux disease)   . High cholesterol   . Lymphadenopathy    documented on chest CT scan 12/15/2005  . Substance abuse ([place])    tobacco   Review of Systems:  Endorses cough productive of brown sputum, chills, fatigue, myalgias, sneezing, congestion, right-sided pain. Denies known fevers, abdominal pain, nausea, vomiting, diarrhea.  Physical Exam:  Vitals:   08/11/20 1338  BP: 112/70  Pulse: 90  Temp: 98.7 F (37.1 C)  TempSrc: Oral  SpO2: 98%  Weight: 203 lb 1.6 oz (92.1 kg)  Height: 5\' 6"  (1.676 m)   Physical Exam Vitals reviewed.  Constitutional:      Appearance: She is obese. She is ill-appearing.     Comments: Obese, elderly female sitting in wheelchair frequently coughing throughout interview  Cardiovascular:     Rate and Rhythm: Normal rate and regular rhythm.     Pulses: Normal pulses.     Heart sounds: Normal heart sounds.  Pulmonary:     Effort: Pulmonary effort is normal. No respiratory distress.     Breath sounds: Normal breath sounds. No wheezing or rales.  Abdominal:     General: Abdomen is flat. Bowel sounds are normal.     Palpations: Abdomen is soft.     Tenderness: There is no abdominal tenderness.    Assessment & Plan:   See Encounters Tab for problem based charting.  Patient discussed with Dr. Heber [name]

## 2020-08-12 LAB — NOVEL CORONAVIRUS, NAA: SARS-CoV-2, NAA: NOT DETECTED

## 2020-08-12 LAB — SARS-COV-2, NAA 2 DAY TAT

## 2020-08-17 NOTE — Progress Notes (Signed)
Internal Medicine Clinic Attending  Case discussed with Dr. Johnson  At the time of the visit.  We reviewed the resident's history and exam and pertinent patient test results.  I agree with the assessment, diagnosis, and plan of care documented in the resident's note.  

## 2020-10-07 ENCOUNTER — Other Ambulatory Visit: Payer: Self-pay | Admitting: Student

## 2020-10-07 DIAGNOSIS — N3946 Mixed incontinence: Secondary | ICD-10-CM

## 2020-10-20 ENCOUNTER — Other Ambulatory Visit: Payer: Self-pay

## 2020-10-20 DIAGNOSIS — K219 Gastro-esophageal reflux disease without esophagitis: Secondary | ICD-10-CM

## 2020-10-21 MED ORDER — PANTOPRAZOLE SODIUM 40 MG PO TBEC: 1.0000 | DELAYED_RELEASE_TABLET | Freq: Every day | ORAL | 1 refills | Status: DC

## 2020-11-21 ENCOUNTER — Encounter: Payer: Self-pay | Admitting: *Deleted

## 2020-11-28 ENCOUNTER — Other Ambulatory Visit: Payer: Self-pay | Admitting: Student

## 2020-11-30 ENCOUNTER — Other Ambulatory Visit: Payer: Self-pay | Admitting: Internal Medicine

## 2020-11-30 DIAGNOSIS — N3946 Mixed incontinence: Secondary | ICD-10-CM

## 2021-01-17 ENCOUNTER — Other Ambulatory Visit: Payer: Self-pay | Admitting: Student

## 2021-01-17 DIAGNOSIS — R0981 Nasal congestion: Secondary | ICD-10-CM

## 2021-01-30 ENCOUNTER — Other Ambulatory Visit: Payer: Self-pay | Admitting: Internal Medicine

## 2021-01-30 DIAGNOSIS — N3946 Mixed incontinence: Secondary | ICD-10-CM

## 2021-02-14 ENCOUNTER — Encounter: Payer: Self-pay | Admitting: Internal Medicine

## 2021-02-14 NOTE — Progress Notes (Deleted)
HTN Amlodipine 10 mg (2.5 mg per cardiology), metoprolol succinate 25 mg BMP 08/11/20  repeat HLD Rosuvastatin 20 mg Lipid panel 06/21 LDL=66 repeat Pre-dm 12/08/19  a1c 5.8 repeat Tobacco use Does not want varenicline Has she tried bupropion? Depression COPD wo previous PFT Albuterol Hx of chronic cough--> CT chest ordered but not completed Mixed incontinence Myrbetriq

## 2021-02-15 ENCOUNTER — Encounter: Payer: Medicare Other | Admitting: Internal Medicine

## 2021-03-24 ENCOUNTER — Other Ambulatory Visit: Payer: Self-pay | Admitting: Internal Medicine

## 2021-03-24 DIAGNOSIS — N3946 Mixed incontinence: Secondary | ICD-10-CM

## 2021-03-25 ENCOUNTER — Other Ambulatory Visit: Payer: Self-pay | Admitting: Internal Medicine

## 2021-03-25 MED ORDER — GABAPENTIN 600 MG PO TABS: 600.0000 mg | ORAL_TABLET | Freq: Four times a day (QID) | ORAL | 2 refills | Status: DC

## 2021-03-25 NOTE — Telephone Encounter (Signed)
MED REFILL REQUEST  gabapentin (NEURONTIN) 600 MG tablet    [id number] - [place], [place] - [address] AT [address] Phone:  [phone]  Fax:  [phone]      Patient is out of meds.

## 2021-03-29 NOTE — Telephone Encounter (Signed)
Patient has a future appointment scheduled on 04/04/2021 at 1:30 pm with Dr. Vinetta Bergamo.

## 2021-03-30 ENCOUNTER — Other Ambulatory Visit: Payer: Self-pay | Admitting: Internal Medicine

## 2021-03-30 DIAGNOSIS — Z1231 Encounter for screening mammogram for malignant neoplasm of breast: Secondary | ICD-10-CM

## 2021-04-04 ENCOUNTER — Encounter: Payer: Medicare Other | Admitting: Internal Medicine

## 2021-04-05 ENCOUNTER — Other Ambulatory Visit: Payer: Self-pay

## 2021-04-05 ENCOUNTER — Ambulatory Visit (INDEPENDENT_AMBULATORY_CARE_PROVIDER_SITE_OTHER): Payer: Medicare Other | Admitting: Internal Medicine

## 2021-04-05 ENCOUNTER — Encounter: Payer: Self-pay | Admitting: Internal Medicine

## 2021-04-05 VITALS — BP 113/90 | HR 78 | Temp 98.0°F | Ht 67.0 in | Wt 202.8 lb

## 2021-04-05 DIAGNOSIS — M545 Low back pain, unspecified: Secondary | ICD-10-CM | POA: Diagnosis not present

## 2021-04-05 DIAGNOSIS — Z23 Encounter for immunization: Secondary | ICD-10-CM

## 2021-04-05 DIAGNOSIS — N3946 Mixed incontinence: Secondary | ICD-10-CM

## 2021-04-05 DIAGNOSIS — Z0000 Encounter for general adult medical examination without abnormal findings: Secondary | ICD-10-CM | POA: Diagnosis not present

## 2021-04-05 LAB — GLUCOSE, CAPILLARY: Glucose-Capillary: 105 mg/dL — ABNORMAL HIGH (ref 70–99)

## 2021-04-05 LAB — POCT GLYCOSYLATED HEMOGLOBIN (HGB A1C): Hemoglobin A1C: 5.6 % (ref 4.0–5.6)

## 2021-04-05 NOTE — Addendum Note (Signed)
Addended by: Wayland Denis on: 04/05/2021 02:56 PM   Modules accepted: Level of Service

## 2021-04-05 NOTE — Progress Notes (Signed)
CC: routine health visit  HPI:  [name] is a 75 y.o. female with a past medical history stated below and presents today for routine health visit. Please see problem based assessment and plan for additional details.  Past Medical History:  Diagnosis Date   Chronic back pain    "mostly lower; left foot is numb all the time" (11/22/2016)   Chronic kidney disease    ?renal cyst on MRI    GERD (gastroesophageal reflux disease)    High cholesterol    Lymphadenopathy    documented on chest CT scan 12/15/2005   Substance abuse ([facility])    tobacco   Viral upper respiratory tract infection with cough 08/11/2020   Viral URI 05/31/2011    Current Outpatient Medications on File Prior to Visit  Medication Sig Dispense Refill   albuterol (VENTOLIN HFA) 108 (90 Base) MCG/ACT inhaler INHALE 1-2 PUFFS BY MOUTH EVERY 6 HOURS AS NEEDED FOR WHEEZE OR SHORTNESS OF BREATH 8.5 each 2   amLODipine (NORVASC) 10 MG tablet Take 1 tablet (10 mg total) by mouth daily. 90 tablet 6   aspirin 81 MG chewable tablet Chew 81 mg by mouth daily.     cetirizine (ZYRTEC) 10 MG tablet TAKE 1 TABLET BY MOUTH EVERY DAY 90 tablet 0   fluticasone (FLONASE) 50 MCG/ACT nasal spray PLACE 1-2 SPRAYS INTO BOTH NOSTRILS DAILY. 48 mL 1   gabapentin (NEURONTIN) 600 MG tablet Take 1 tablet (600 mg total) by mouth 4 (four) times daily. 120 tablet 2   HYDROcodone-acetaminophen (NORCO) 10-325 MG tablet Take 1 tablet by mouth 2 (two) times daily. [facility]     Lifitegrast (XIIDRA) 5 % SOLN Apply 1 drop to eye daily. One drop in both eyes     metoprolol succinate (TOPROL-XL) 25 MG 24 hr tablet Take 25 mg by mouth daily.     MYRBETRIQ 25 MG TB24 tablet TAKE 1 TABLET (25 MG TOTAL) BY MOUTH DAILY. 30 tablet 11   pantoprazole (PROTONIX) 40 MG tablet Take 1 tablet (40 mg total) by mouth daily. 90 tablet 1   rosuvastatin (CRESTOR) 20 MG tablet TAKE 1 TABLET (20 MG TOTAL) BY MOUTH DAILY AT 6 PM. 90 tablet 5    tiZANidine (ZANAFLEX) 4 MG tablet Take 1 tablet (4 mg total) by mouth every 8 (eight) hours as needed for muscle spasms. This is a 30 day supply 30 tablet 0   No current facility-administered medications on file prior to visit.    Family History:  Family History  Problem Relation Age of Onset   Diabetes Father    Hypertension Father    Social History: Patient has home aide that is able to come a few days a week at home.  She ambulates at home without assistive devices, by bracing herself on walls and furniture.  She does have a cane, walker, shower chair at home.  Review of Systems: ROS negative except for what is noted on the assessment and plan.  Vitals:   04/05/21 0902  BP: 113/90  Pulse: 78  Temp: 98 F (36.7 C)  TempSrc: Oral  SpO2: 96%  Weight: 202 lb 12.8 oz (92 kg)  Height: 5\' 7"  (1.702 m)     Physical Exam: General: Elderly appearing African-American female, NAD HENT: normocephalic, atraumatic, MMM EYES: conjunctiva non-erythematous, no scleral icterus CV: regular rate, normal rhythm, no murmurs, rubs, gallops.  No lower extremity edema. Pulmonary: Normal work of breathing on RA, lungs clear to auscultation, no  rales, wheezes, rhonchi Abdominal: non-distended, soft, non-tender to palpation, normal BS Skin: Warm and dry, no rashes or lesions Neurological: MS: awake, alert and oriented x3, normal speech and fund of knowledge Motor Exam: RUE Flexion: 5/5 RUE Extension: 5/5 LUE Flexion: 4/5 LUE Extension: 4/5 R Grip: 5/5 L Grip: 5/5 R Hip Flexion: 5/5 L Hip Flexion: 3/5 RLE Flexion: 5/5 RLE Extension: 5/5 LLE Flexion: 4/5 LLE Extension: 4/5  ROM: Patient has limited neck extension.  ROM of back deferred due to patient feeling off balance and declining  Gait: Patient walks with stooped gait, using cane while ambulating around clinic.  Psych: normal affect    Assessment & Plan:   See Encounters Tab for problem based charting.  Patient seen with Dr.  Illene Regulus, M.D. [facility], PGY-1 Pager: [phone] Date 04/05/2021 Time 10:23 AM

## 2021-04-05 NOTE — Assessment & Plan Note (Addendum)
Patient presents today to clinic for routine health visit.  Her incontinence has become increasingly bothersome for her.  Per chart review she has a history of stress and urge incontinence and is currently on antispasmodic medication Myrbetriq.  She reports that she has not been able to control voiding at all.  She will feel the urge to void, and will notice that she has already voided.  She is currently using adult diapers and panty liners which she has been using for the last couple years and these help keep her dry.  She was previously referred to OB/GYN in 2021 for stress incontinence though seems appointment was not made.  Patient reports she has spoken to someone in the past concerning incontinence and did not find this office visit helpful.  Per chart review patient saw urologist in 2018 though records not available.   We discussed multimodal approach to managing incontinence, including behavioral, medication, and surgery.  Patient is interested in speaking to urogynecology surgeon about surgical options.  Plan: -Continue Myrbetriq -Referral to urogynecology: Sherlene Shams, MD

## 2021-04-05 NOTE — Patient Instructions (Signed)
Thank you, Ms.Daina CHARIAH BAILEY for allowing [place] to provide your care today. Today we discussed:  Incontinence: please continue taking the myrberiq, using depends. We will refer you to see a urogynocologist who is a Psychologist, sport and exercise in OBGYN for further multimodal management.   Back pain: It is safe for you to take tylenol daily for back pain. Please continue taking your gabapentin as well. Call for refills. You can also try the lidocaine patches we discussed. We will refer you to see Dr. Nelva Bush again who is now working with [facility] in [place].  Health maintenance: We will refer you to see gastroenterology for a colonoscopy. You got your flu shot today. We will check blood work to screen for diabetes.  I have ordered the following labs for you:   Lab Orders         POC Hbg A1C      I will call if any are abnormal. All of your labs can be accessed through "My Chart".  My Chart Access: [email or web address]  Please follow-up in 6 months.  Please make sure to arrive 15 minutes prior to your next appointment. If you arrive late, you may be asked to reschedule.    We look forward to seeing you next time. Please call our clinic at [phone] if you have any questions or concerns. The best time to call is Monday-Friday from 9am-4pm, but there is someone available 24/7. If after hours or the weekend, call the main hospital number and ask for the Internal Medicine Resident On-Call. If you need medication refills, please notify your pharmacy one week in advance and they will send [place] a request.   Thank you for letting us take part in your care. Wishing you the best!  Wayland Denis, MD 04/05/2021, 10:08 AM IM Resident, PGY-1

## 2021-04-05 NOTE — Assessment & Plan Note (Addendum)
Patient presents to [facility] for routine health visit.  One of her primary complaints has been her chronic low back pain.  Patient reports history of back surgery 10+ years ago with C3-C5 interbody fusion and posterior fixation noted on x-ray imaging on chart review.  Patient reports chronic weakness and sensory changes following surgery.  Patient has previously followed with Dr. Nelva Bush (PM&R). She manages her lumbar back pain with Tylenol and gabapentin.  Gabapentin recently refilled.  Patient is hesitant to take Tylenol though reports pain relief with Tylenol for arthritis.  On exam today, patient resting comfortably with stooped posture.  Patient has stooped posture when standing, and almost able to stand fully straight when prompted, with a few degrees forward flexion still present.  Patient has limited neck extension.  Back ROM deferred per patient preference.  On motor exam patient has left upper extremity and lower extremity weakness that she said has been present chronically since surgery.  We discussed that Tylenol is a safe medication to take as long she is taking recommended amount per bottle instructions.  Patient was encouraged to try Tylenol daily for pain relief in addition to her gabapentin.  We also discussed incorporating lidocaine patches into her pain management regimen.   Plan: -Continue Tylenol and gabapentin -Continue OTC lidocaine patches -Referral to Dr. Nelva Bush (PM&R) with [facility]   ADDENDUM: Received message from [facility]  Referral declined: "Thank you for the referral. However, at this time, there is nothing more our office can offer her. Dr. Primus Bravo or [facility] has been recommended. Thank you!"

## 2021-04-05 NOTE — Assessment & Plan Note (Signed)
Patient with personal history of colon polyps is under high risk colon cancer surveillance with last colonoscopy completed in 2019.  2 sessile polyps, tubular adenomas, identified on last colonoscopy. Patient is due for another colonoscopy.   Last Hgb A1c 5.8%. BMI 31. Weight has been stable, has lost some weight since 2021. Last CBC, BMP 07/2020 without significant findings.  Plan: -referral to GI for colonoscopy -repeat Hgb A1c today -flu shot today

## 2021-04-16 ENCOUNTER — Other Ambulatory Visit: Payer: Self-pay | Admitting: Internal Medicine

## 2021-04-19 ENCOUNTER — Other Ambulatory Visit: Payer: Self-pay | Admitting: Student

## 2021-04-19 DIAGNOSIS — K219 Gastro-esophageal reflux disease without esophagitis: Secondary | ICD-10-CM

## 2021-05-02 ENCOUNTER — Ambulatory Visit: Payer: Medicare Other

## 2021-05-03 ENCOUNTER — Ambulatory Visit: Payer: Medicare Other

## 2021-05-04 NOTE — Progress Notes (Signed)
Internal Medicine Clinic Attending ° °I saw and evaluated the patient.  I personally confirmed the key portions of the history and exam documented by Dr. Zinoviev and I reviewed pertinent patient test results.  The assessment, diagnosis, and plan were formulated together and I agree with the documentation in the resident’s note.  °

## 2021-05-23 NOTE — Progress Notes (Signed)
[name] New Patient Evaluation and Consultation  Referring Provider: Angelica Pou, MD PCP: Christiana Fuchs, DO Date of Service: 05/25/2021  SUBJECTIVE Chief Complaint: New Patient (Initial Visit) Marland KitchenCherly Erno [name] is a 75 y.o. female here for a consult on incontinence.)  History of Present Illness: Laura [name] is a 75 y.o. Black or African-American female seen in consultation at the request of Dr. Jimmye Norman for evaluation of incontinence.    Review of records from Dr Jimmye Norman Dr Vinetta Bergamo significant for: Has mixed incontinence. Currently taking myrbetriq. Has not been able to control voidng and wears diapers.   Urinary Symptoms: Leaks urine with during sex, with a full bladder, with movement to the bathroom, with urgency, and without sensation Mostly in the morning when she wakes up, she has to run to the bathroom, and leaks at that time.  Otherwise, can usually hold it- does not wait for her bladder to get full enough. Pad use: 2 liners/ mini-pads per day.   She is bothered by her UI symptoms. Has been on Myrbetriq 25mg  for about a year. Has not tried other medications.   Day time voids 2-3.  Nocturia: 2-3 times per night to void. Voiding dysfunction: she does not empty her bladder well.  does not use a catheter to empty bladder.  When urinating, she feels the need to urinate multiple times in a row Drinks: 7up zero, does not drink much water  UTIs:  0  UTI's in the last year.   Denies history of blood in urine and kidney or bladder stones  Pelvic Organ Prolapse Symptoms:                  She Admits to a feeling of a bulge the vaginal area. Feels her bladder is dropping down.   Bowel Symptom: Bowel movements: 1 time(s) per day Stool consistency: soft  Straining: no.  Splinting: no.  Incomplete evacuation: no.  She Denies accidental bowel leakage / fecal incontinence Bowel regimen: none   Sexual Function Sexually active: yes.  Pain with sex:  No  Pelvic Pain Denies pelvic pain    Past Medical History:  Past Medical History:  Diagnosis Date   Chronic back pain    "mostly lower; left foot is numb all the time" (11/22/2016)   Chronic kidney disease    ?renal cyst on MRI    GERD (gastroesophageal reflux disease)    High cholesterol    Lymphadenopathy    documented on chest CT scan 12/15/2005   Substance abuse ([place])    tobacco   Viral upper respiratory tract infection with cough 08/11/2020   Viral URI 05/31/2011     Past Surgical History:   Past Surgical History:  Procedure Laterality Date   ANTERIOR CERVICAL DISCECTOMY     C3-4, C4-5 diskectomy, fusion, plating and allograft,    BACK SURGERY     CARPAL TUNNEL WITH CUBITAL TUNNEL Left    LUMBAR DISC SURGERY  X 2-3   POSTERIOR CERVICAL FUSION/FORAMINOTOMY     C3-5 fusion with wirin   POSTERIOR LUMBAR FUSION     SPINE SURGERY     s/p C3-4, C4-5 diskectomy, fusion, plating and allograft, s/p posterior C3-5 fusion with wiring(02/11/2007 by Dr Lorin Mercy) had psudoarthrosis lumbar surgery 2000)     Past OB/GYN History: OB History  No obstetric history on file.   G0 Menopausal: Yes, Denies vaginal bleeding since menopause  Any history of abnormal pap smears: no.   Medications: She has a current  medication list which includes the following prescription(s): albuterol, amlodipine, aspirin, gabapentin, metoprolol succinate, pantoprazole, rosuvastatin, vibegron, and amlodipine.   Allergies: Patient has No Known Allergies.   Social History:  Social History   Tobacco Use   Smoking status: Former    Packs/day: 0.50    Years: 47.00    Pack years: 23.50    Types: Cigarettes    Quit date: 05/13/2020    Years since quitting: 1.0   Smokeless tobacco: Never   Tobacco comments:    stopped around Thanksgiving   Vaping Use   Vaping Use: Never used  Substance Use Topics   Alcohol use: Yes    Alcohol/week: 0.0 standard drinks    Comment: 11/22/2016 "2 drinks q other  holiday"   Drug use: Not Currently    Types: Marijuana    Comment: 66/2018 "I stopped in the 1970s"    Relationship status: single She lives alone She is not employed. Regular exercise: No History of abuse: No  Family History:   Family History  Problem Relation Age of Onset   Diabetes Father    Hypertension Father      Review of Systems: Review of Systems  Constitutional:  Negative for fever, malaise/fatigue and weight loss.  Respiratory:  Negative for cough, shortness of breath and wheezing.   Cardiovascular:  Negative for chest pain, palpitations and leg swelling.  Gastrointestinal:  Negative for abdominal pain and blood in stool.  Genitourinary:  Negative for dysuria.  Musculoskeletal:  Negative for myalgias.  Skin:  Negative for rash.  Neurological:  Negative for dizziness and headaches.  Endo/Heme/Allergies:  Does not bruise/bleed easily.  Psychiatric/Behavioral:  Negative for depression. The patient is not nervous/anxious.     OBJECTIVE Physical Exam: Vitals:   05/25/21 0842  BP: 121/78  Pulse: 89  Weight: 196 lb (88.9 kg)  Height: 5\' 5"  (1.651 m)    Physical Exam   GU / Detailed Urogynecologic Evaluation:  Pelvic Exam: Normal external female genitalia; Bartholin's and Skene's glands normal in appearance; urethral meatus normal in appearance, no urethral masses or discharge.   CST: negative  Speculum exam reveals normal vaginal mucosa with atrophy. Cervix normal appearance. Uterus normal single, nontender. Adnexa no mass, fullness, tenderness.     Pelvic floor strength I/V  Pelvic floor musculature: Right levator non-tender, Right obturator non-tender, Left levator non-tender, Left obturator non-tender  POP-Q:   POP-Q  -3                                            Aa   -3                                           Ba  -7                                              C   2                                            Gh  3  Pb  8                                            tvl   -3                                            Ap  -3                                            Bp  -8                                              D     Rectal Exam:  Normal external rectum  Post-Void Residual (PVR) by Bladder Scan: In order to evaluate bladder emptying, we discussed obtaining a postvoid residual and she agreed to this procedure.  Procedure: The ultrasound unit was placed on the patient's abdomen in the suprapubic region after the patient had voided. A PVR of 54 ml was obtained by bladder scan.  Laboratory Results: POC urine: small leukocytes, negative nitrites   ASSESSMENT AND PLAN Laura [name] is a 75 y.o. with:  1. Urinary frequency   2. Overactive bladder    - We discussed the symptoms of overactive bladder (OAB), which include urinary urgency, urinary frequency, nocturia, with or without urge incontinence.  While we do not know the exact etiology of OAB, several treatment options exist. We discussed management including behavioral therapy (decreasing bladder irritants, urge suppression strategies, timed voids, bladder retraining), physical therapy, medication; for refractory cases posterior tibial nerve stimulation, sacral neuromodulation, and intravesical botulinum toxin injection.  - Will stop Myrbetriq and start Gemtesa 75mg  daily. Samples provided.  - Referral also placed to pelvic physical therapy  - She will work on reducing soda intake and drink more water  Return 6 weeks for follow up   Jaquita Folds, MD

## 2021-05-25 ENCOUNTER — Ambulatory Visit (INDEPENDENT_AMBULATORY_CARE_PROVIDER_SITE_OTHER): Payer: Medicare Other | Admitting: Obstetrics and Gynecology

## 2021-05-25 ENCOUNTER — Other Ambulatory Visit: Payer: Self-pay

## 2021-05-25 ENCOUNTER — Other Ambulatory Visit: Payer: Self-pay | Admitting: Obstetrics and Gynecology

## 2021-05-25 ENCOUNTER — Encounter: Payer: Self-pay | Admitting: Obstetrics and Gynecology

## 2021-05-25 VITALS — BP 121/78 | HR 89 | Ht 65.0 in | Wt 196.0 lb

## 2021-05-25 DIAGNOSIS — R350 Frequency of micturition: Secondary | ICD-10-CM

## 2021-05-25 DIAGNOSIS — N3281 Overactive bladder: Secondary | ICD-10-CM | POA: Diagnosis not present

## 2021-05-25 LAB — POCT URINALYSIS DIPSTICK
Appearance: ABNORMAL
Bilirubin, UA: NEGATIVE
Blood, UA: NEGATIVE
Glucose, UA: NEGATIVE
Ketones, UA: NEGATIVE
Nitrite, UA: NEGATIVE
Protein, UA: NEGATIVE
Spec Grav, UA: >=1.03 — AB (ref 1.010–1.025)
Urobilinogen, UA: 1 E.U./dL
pH, UA: 6 (ref 5.0–8.0)

## 2021-05-25 MED ORDER — VIBEGRON 75 MG PO TABS: 1.0000 | ORAL_TABLET | Freq: Every day | ORAL | 5 refills | Status: DC

## 2021-05-25 NOTE — Patient Instructions (Addendum)
Today we talked about ways to manage bladder urgency such as altering your diet to avoid irritative beverages and foods (bladder diet) as well as attempting to decrease stress and other exacerbating factors.  Drink about 60oz water per day.   The Most Bothersome Foods* The Least Bothersome Foods*  Coffee - Regular & Decaf Tea - caffeinated Carbonated beverages - cola, non-colas, diet & caffeine-free Alcohols - Beer, Red Wine, White Wine, Champagne Fruits - Grapefruit, [place], Orange, [employer] - Cranberry, Grapefruit, Orange, Pineapple Vegetables - Tomato & Tomato Products Flavor Enhancers - Hot peppers, Spicy foods, Chili, Horseradish, Vinegar, Monosodium glutamate (MSG) Artificial Sweeteners - NutraSweet, Sweet 'N Low, Equal (sweetener), Saccharin Ethnic foods - [place], [place], [place] food Express Scripts - low-fat & whole Fruits - Bananas, Blueberries, Honeydew melon, Pears, Raisins, Watermelon Vegetables - Broccoli, Brussels Sprouts, [place], Carrots, Cauliflower, [place], Cucumber, Mushrooms, Peas, Radishes, Squash, Zucchini, White potatoes, Sweet potatoes & yams Poultry - Chicken, Eggs, [place], [employer] - Beef, Programmer, multimedia, Lamb Seafood - Shrimp, [place] fish, Salmon Grains - Oat, Rice Snacks - Pretzels, Popcorn  *Lissa Morales et al. Diet and its role in interstitial cystitis/bladder pain syndrome (IC/BPS) and comorbid conditions. [facility] 2012 Jan 11.

## 2021-06-06 NOTE — Telephone Encounter (Signed)
PA started through cover my meds [phone]. Status: PENDING [id number]

## 2021-06-24 NOTE — Progress Notes (Signed)
Submitted PA for Gemtesa 75mg  on Cover my Meds. PA Case ID: [id number]    Status: Approved  Prior Auth: Coverage Start Date:06/06/2021; Coverage End Date:06/18/2022

## 2021-07-08 ENCOUNTER — Ambulatory Visit: Payer: Medicare Other | Admitting: Obstetrics and Gynecology

## 2021-07-12 ENCOUNTER — Other Ambulatory Visit: Payer: Self-pay | Admitting: Internal Medicine

## 2021-07-12 DIAGNOSIS — R0981 Nasal congestion: Secondary | ICD-10-CM

## 2021-07-26 ENCOUNTER — Other Ambulatory Visit: Payer: Self-pay

## 2021-07-26 MED ORDER — GABAPENTIN 600 MG PO TABS: 600.0000 mg | ORAL_TABLET | Freq: Four times a day (QID) | ORAL | 2 refills | Status: DC

## 2021-07-26 NOTE — Telephone Encounter (Signed)
Please have Ms. Lembo follow-up with me next month. Will refill Gabapentin at this time.

## 2021-07-27 NOTE — Telephone Encounter (Signed)
Please schedule an appt next month with Dr Howie Ill. Thanks

## 2021-07-29 ENCOUNTER — Other Ambulatory Visit: Payer: Self-pay | Admitting: Student

## 2021-07-29 DIAGNOSIS — E7800 Pure hypercholesterolemia, unspecified: Secondary | ICD-10-CM

## 2021-07-29 MED ORDER — ROSUVASTATIN CALCIUM 20 MG PO TABS: 20.0000 mg | ORAL_TABLET | Freq: Every day | ORAL | 5 refills | Status: DC

## 2021-07-29 NOTE — Progress Notes (Signed)
Patient called stating she is having a headache for two days. Gets better intermittently. When lying down and being still it feels better. On L side of head and forehead. And feels like its going down into eye. No nausea or vomiting. Feeling like vision is cloudy, not as clear as it normally is. Have taken tylenol arthritis but did not really help.  Headache there when she wakes up. Took blood pressures medications. No focal weakness. No stroke in the past. She denies any falls or any other trauma.   -Discussed with patient to take aleve and see if this helps with pain. I suspect she is having a tension or cluster headache. She stated that she will call us tomorrow and let us know if the pain is improved. In addition we will send a message to the front desk to schedule an appointment for the patient early next week.  -Talked with patient that she should go to the hospital if she notices one side is weaker than the other, because she may be having a stroke.

## 2021-08-03 ENCOUNTER — Ambulatory Visit (INDEPENDENT_AMBULATORY_CARE_PROVIDER_SITE_OTHER): Payer: Medicare Other | Admitting: Internal Medicine

## 2021-08-03 DIAGNOSIS — G44209 Tension-type headache, unspecified, not intractable: Secondary | ICD-10-CM | POA: Diagnosis not present

## 2021-08-03 DIAGNOSIS — R519 Headache, unspecified: Secondary | ICD-10-CM | POA: Insufficient documentation

## 2021-08-03 NOTE — Assessment & Plan Note (Addendum)
The patient is here today for follow-up of her headache.  She states that she had onset of left-sided headache over the weekend.  She spoke with Dr. Eulas Post who recommended Aleve for pain relief.  The patient states that she took some Aleve on Saturday, and then the headache went away.  When the headache came on, it was constant but not severely debilitating.  She states that it was not worsened by lights or sound.  She does not endorse a headache right now.  She thinks that the headache is related to not eating much over the weekend due to the recent passing of her cousin.  States that she had some numbness in her left hand however she missed some of her gabapentin doses.  She denies numbness or tingling now.  No visual changes.  No other concerns today.    Plan: Patient was advised to take Aleve if this headache returns.  Return precautions given.

## 2021-08-03 NOTE — Progress Notes (Signed)
° °  CC: headache follow-up  HPI:  [name] is a 76 y.o. with past medical history as noted below who presents to the clinic today for a headache follow-up. Please see problem-based list for further details, assessments, and plans.  Past Medical History:  Diagnosis Date   Chronic back pain    "mostly lower; left foot is numb all the time" (11/22/2016)   Chronic kidney disease    ?renal cyst on MRI    GERD (gastroesophageal reflux disease)    High cholesterol    Lymphadenopathy    documented on chest CT scan 12/15/2005   Substance abuse ([place])    tobacco   Viral upper respiratory tract infection with cough 08/11/2020   Viral URI 05/31/2011   Review of Systems: Negative aside from that listed in individualized problem based charting.  Physical Exam:  Vitals:   08/03/21 1623  BP: 111/66  Pulse: 95  Temp: 97.7 F (36.5 C)  TempSrc: Oral  SpO2: 97%  Weight: 192 lb 1.6 oz (87.1 kg)  Height: 5\' 5"  (1.651 m)   General: NAD, nl appearance HE: Normocephalic, atraumatic, EOMI, Conjunctivae normal ENT: No congestion, no rhinorrhea, no exudate or erythema  Cardiovascular: Normal rate, regular rhythm. No murmurs, rubs, or gallops Pulmonary: Effort normal, breath sounds normal. No wheezes, rales, or rhonchi Abdominal: soft, nontender, bowel sounds present Musculoskeletal: no swelling, deformity, injury or tenderness in extremities Skin: Warm, dry, no bruising, erythema, or rash Psychiatric/Behavioral: normal mood, normal behavior  Assessment & Plan:   See Encounters Tab for problem based charting.  Patient discussed with Dr. Evette Doffing

## 2021-08-03 NOTE — Patient Instructions (Signed)
Thank you, Ms.Joniqua JACE DOWE for allowing [place] to provide your care today. Today we discussed your headache and belly soreness.  Headache: I am happy to hear that this has gone away! You can take aleve as needed if it comes back.   I have ordered the following labs for you:  Lab Orders  No laboratory test(s) ordered today    Referrals ordered today:   Referral Orders  No referral(s) requested today     I have ordered the following medication/changed the following medications:   Stop the following medications: There are no discontinued medications.   Start the following medications: No orders of the defined types were placed in this encounter.    Follow up: 4-6 months   Should you have any questions or concerns please call the internal medicine clinic at [phone].

## 2021-08-04 NOTE — Progress Notes (Signed)
Internal Medicine Clinic Attending ° °Case discussed with Dr. Bonanno  °  At the time of the visit.  We reviewed the resident’s history and exam and pertinent patient test results.  I agree with the assessment, diagnosis, and plan of care documented in the resident’s note. ° °

## 2021-08-04 NOTE — Progress Notes (Unsigned)
[facility] Return Visit  SUBJECTIVE  History of Present Illness: Laura [name] is a 76 y.o. female seen in follow-up for overactive bladder. Plan at last visit was to start Gemtesa 75mg .  Previously tried medications: Myrbetriq     Past Medical History: Patient  has a past medical history of Chronic back pain, Chronic kidney disease, GERD (gastroesophageal reflux disease), High cholesterol, Lymphadenopathy, Substance abuse ([place]), Viral upper respiratory tract infection with cough (08/11/2020), and Viral URI (05/31/2011).   Past Surgical History: She  has a past surgical history that includes Spine surgery; Back surgery; Anterior cervical discectomy; Posterior cervical fusion/foraminotomy; Lumbar disc surgery (X 2-3); Posterior lumbar fusion; and Carpal tunnel with cubital tunnel (Left).   Medications: She has a current medication list which includes the following prescription(s): albuterol, amlodipine, amlodipine, aspirin, fluticasone, gabapentin, gemtesa, metoprolol succinate, pantoprazole, and rosuvastatin.   Allergies: Patient has No Known Allergies.   Social History: Patient  reports that she quit smoking about 14 months ago. Her smoking use included cigarettes. She has a 23.50 pack-year smoking history. She has never used smokeless tobacco. She reports current alcohol use. She reports that she does not currently use drugs after having used the following drugs: Marijuana.      OBJECTIVE     Physical Exam: There were no vitals filed for this visit. Gen: No apparent distress, A&O x 3.  Detailed Urogynecologic Evaluation:  Deferred. Prior exam showed:  No flowsheet data found.     ASSESSMENT AND PLAN    Laura [name] is a 76 y.o. with:  No diagnosis found.

## 2021-08-05 ENCOUNTER — Ambulatory Visit: Payer: Medicare Other | Admitting: Obstetrics and Gynecology

## 2021-09-05 ENCOUNTER — Encounter: Payer: Self-pay | Admitting: Internal Medicine

## 2021-09-05 ENCOUNTER — Encounter: Payer: Medicare Other | Admitting: Internal Medicine

## 2021-09-05 NOTE — Progress Notes (Deleted)
Albuterol ? ?Mixed incontinence ?Gemtasa ?Pelvic pt ?Follows with urogyn ? ?Protonix 40 ? ?HTN ?Amlodipine 10 ?Metoprolol 25 ?Follows with Dr. Terrence Dupont for cardiology ? ?HLD ?Lipid panel 06/21 with LDL at goal for primary prevention ?A/P: ?Recheck Lipid panel ?Crestor 20 ? ?Chronic Back pain  ?Gabapentin 600 4x ?tylenol ? ?ASA no prior hx of stroke ? ?Care gaps: ?Hep C ?Shingrix ?Covid ?

## 2021-10-11 ENCOUNTER — Ambulatory Visit (INDEPENDENT_AMBULATORY_CARE_PROVIDER_SITE_OTHER): Payer: Medicare Other | Admitting: Internal Medicine

## 2021-10-11 ENCOUNTER — Encounter: Payer: Self-pay | Admitting: Internal Medicine

## 2021-10-11 DIAGNOSIS — R0981 Nasal congestion: Secondary | ICD-10-CM

## 2021-10-11 DIAGNOSIS — J302 Other seasonal allergic rhinitis: Secondary | ICD-10-CM | POA: Diagnosis not present

## 2021-10-11 DIAGNOSIS — R059 Cough, unspecified: Secondary | ICD-10-CM | POA: Diagnosis not present

## 2021-10-11 MED ORDER — FLUTICASONE PROPIONATE 50 MCG/ACT NA SUSP: NASAL | 3 refills | Status: DC

## 2021-10-11 MED ORDER — CLARITIN-D 24 HOUR 10-240 MG PO TB24: 1.0000 | ORAL_TABLET | Freq: Every day | ORAL | 3 refills | Status: DC

## 2021-10-11 MED ORDER — NETI POT SINUS WASH 2300-700 MG NA KIT: 1.0000 | PACK | Freq: Every day | NASAL | 0 refills | Status: DC

## 2021-10-11 NOTE — Progress Notes (Signed)
Internal Medicine Clinic Attending ° °Case discussed with Dr. Atway  At the time of the visit.  We reviewed the resident’s history and exam and pertinent patient test results.  I agree with the assessment, diagnosis, and plan of care documented in the resident’s note.  °

## 2021-10-11 NOTE — Patient Instructions (Addendum)
Thank you, Ms.Runette META KROENKE for allowing [place] to provide your care today. Today we discussed: ? ?Cough/congestion/sinus pain: Use the Neti Pot as directed to help clean out your sinuses. After you do this, then you can use the flonase spray. Also, start taking Claritin once a day to help your allergies. If your symptoms do not get better, or if you develop fevers, chills, or are coughing up more than a tablespoon of blood, please call us. Call our clinic at [phone] or after hours call [phone] and ask for the internal medicine resident on call. ? ? ?I have ordered the following labs for you: ? ?Lab Orders  ?No laboratory test(s) ordered today  ?  ? ? ?Referrals ordered today:  ? ?Referral Orders  ?No referral(s) requested today  ?  ? ?I have ordered the following medication/changed the following medications:  ? ?Stop the following medications: ?Medications Discontinued During This Encounter  ?Medication Reason  ? fluticasone (FLONASE) 50 MCG/ACT nasal spray Reorder  ?  ? ?Start the following medications: ?Meds ordered this encounter  ?Medications  ? loratadine-pseudoephedrine (CLARITIN-D 24 HOUR) 10-240 MG 24 hr tablet  ?  Sig: Take 1 tablet by mouth daily.  ?  Dispense:  90 tablet  ?  Refill:  3  ? Sodium Chloride-Sodium Bicarb (NETI POT SINUS WASH) 2300-700 MG KIT  ?  Sig: Place 1 kit into the nose daily.  ?  Dispense:  1 kit  ?  Refill:  0  ? fluticasone (FLONASE) 50 MCG/ACT nasal spray  ?  Sig: USE 1-2 SPRAYS IN EACH NOSTRIL DAILY  ?  Dispense:  48 mL  ?  Refill:  3  ?  ? ?Follow up: 3 months routine visit/lab work  ? ? ?Should you have any questions or concerns please call the internal medicine clinic at [phone].   ? ? ?Buddy Duty, D.O. ?[place] ? ? ?

## 2021-10-11 NOTE — Assessment & Plan Note (Signed)
Patient presents today with a week of worsening cough with sputum production, sore throat, nasal congestion and sinus pain. She had a root canal about one week ago with her dentist and had some pain associated with this, but notes that that pain has resolved and this sinus pain is different. She denies any fevers, chills, dyspnea, nausea, vomiting, or diarrhea. She did state that she coughed up less than a teaspoon of blood once last week, but otherwise her sputum production has been clear. Of note, the patient states that she quit smoking cigarettes about 2 weeks ago (was smoking ~0.5 pack per day prior to this). Discussed with the patient that since she has just recently quit smoking, that her increase in sputum/phlegm production could be secondary to this. Additionally, her symptoms seem consistent with allergic rhinitis and the patient has been off of her OTC allergy medication (claritin). She has only been using Flonase at this time. ? ? ?Plan: ?- Advised patient to use neti pot for sinus irrigation prior to using flonase spray ?- Claritin once a day ?- Advised patient to return if she develops fevers, chills, green sputum production, or an increase in bloody sputum ?

## 2021-10-11 NOTE — Progress Notes (Signed)
? ?  CC: cough, sore throat, sinus pain  ? ?HPI: ? ?[name] is a 76 y.o. female with hypertension, GERD, and hyperlipidemia who presents to the [facility] for a cough and sore throat. Please see problem-based list for further details, assessments, and plans. ? ? ?Past Medical History:  ?Diagnosis Date  ? Abnormal finding 01/15/2018  ? Chronic back pain   ? "mostly lower; left foot is numb all the time" (11/22/2016)  ? Chronic kidney disease   ? ?renal cyst on MRI   ? GERD (gastroesophageal reflux disease)   ? High cholesterol   ? Lymphadenopathy   ? documented on chest CT scan 12/15/2005  ? Substance abuse ([place])   ? tobacco  ? Viral upper respiratory tract infection with cough 08/11/2020  ? Viral URI 05/31/2011  ? ?Review of Systems:  Review of Systems  ?Constitutional:  Negative for chills and fever.  ?HENT:  Positive for congestion, sinus pain and sore throat. Negative for hearing loss and nosebleeds.   ?Respiratory:  Positive for cough and sputum production. Negative for shortness of breath.   ?Cardiovascular:  Negative for chest pain and palpitations.  ?Gastrointestinal:  Negative for diarrhea, nausea and vomiting.  ?Neurological:  Negative for dizziness and headaches.  ? ? ?Physical Exam: ? ?Vitals:  ? 10/11/21 1005  ?BP: 131/76  ?Pulse: 85  ?Temp: 98.1 ?F (36.7 ?C)  ?TempSrc: Oral  ?SpO2: 93%  ?Weight: 199 lb (90.3 kg)  ?Height: '5\' 7"'$  (1.702 m)  ? ?General: Pleasant, well-appearing female. No acute distress. ?CV: RRR. No murmurs, rubs, or gallops. No LE edema ?Pulmonary: Lungs CTAB. Normal effort. No wheezing or rales. ?Abdominal: Soft, nontender, nondistended.  ?Skin: Warm and dry.  ?Neuro: A&Ox3. No focal deficit. ?Psych: Normal mood and affect ? ? ?Assessment & Plan:  ? ?See Encounters Tab for problem based charting. ? ?Patient discussed with Dr.  Cain Sieve ? ?Allergic rhinitis ?Patient presents today with a week of worsening cough with sputum production, sore throat, nasal congestion and sinus pain. She had a  root canal about one week ago with her dentist and had some pain associated with this, but notes that that pain has resolved and this sinus pain is different. She denies any fevers, chills, dyspnea, nausea, vomiting, or diarrhea. She did state that she coughed up less than a teaspoon of blood once last week, but otherwise her sputum production has been clear. Of note, the patient states that she quit smoking cigarettes about 2 weeks ago (was smoking ~0.5 pack per day prior to this). Discussed with the patient that since she has just recently quit smoking, that her increase in sputum/phlegm production could be secondary to this. Additionally, her symptoms seem consistent with allergic rhinitis and the patient has been off of her OTC allergy medication (claritin). She has only been using Flonase at this time. ? ? ?Plan: ?- Advised patient to use neti pot for sinus irrigation prior to using flonase spray ?- Claritin once a day ?- Advised patient to return if she develops fevers, chills, green sputum production, or an increase in bloody sputum ? ? ?

## 2021-10-12 ENCOUNTER — Other Ambulatory Visit (INDEPENDENT_AMBULATORY_CARE_PROVIDER_SITE_OTHER): Payer: Medicare Other

## 2021-10-12 DIAGNOSIS — I10 Essential (primary) hypertension: Secondary | ICD-10-CM | POA: Diagnosis not present

## 2021-10-12 DIAGNOSIS — E7800 Pure hypercholesterolemia, unspecified: Secondary | ICD-10-CM

## 2021-10-13 LAB — BMP8+ANION GAP
Anion Gap: 16 mmol/L (ref 10.0–18.0)
BUN/Creatinine Ratio: 16 (ref 12–28)
BUN: 14 mg/dL (ref 8–27)
CO2: 19 mmol/L — ABNORMAL LOW (ref 20–29)
Calcium: 9 mg/dL (ref 8.7–10.3)
Chloride: 107 mmol/L — ABNORMAL HIGH (ref 96–106)
Creatinine, Ser: 0.85 mg/dL (ref 0.57–1.00)
Glucose: 114 mg/dL — ABNORMAL HIGH (ref 70–99)
Potassium: 4 mmol/L (ref 3.5–5.2)
Sodium: 142 mmol/L (ref 134–144)
eGFR: 72 mL/min/{1.73_m2} (ref >59)

## 2021-10-13 LAB — HEPATIC FUNCTION PANEL
ALT: 13 IU/L (ref 0–32)
AST: 14 IU/L (ref 0–40)
Albumin: 3.5 g/dL — ABNORMAL LOW (ref 3.7–4.7)
Alkaline Phosphatase: 118 IU/L (ref 44–121)
Bilirubin Total: 0.3 mg/dL (ref 0.0–1.2)
Bilirubin, Direct: 0.1 mg/dL (ref 0.00–0.40)
Total Protein: 7.2 g/dL (ref 6.0–8.5)

## 2021-10-13 LAB — LIPID PANEL
Chol/HDL Ratio: 3.8 ratio (ref 0.0–4.4)
Cholesterol, Total: 106 mg/dL (ref 100–199)
HDL: 28 mg/dL — ABNORMAL LOW (ref >39)
LDL Chol Calc (NIH): 63 mg/dL (ref 0–99)
Triglycerides: 72 mg/dL (ref 0–149)
VLDL Cholesterol Cal: 15 mg/dL (ref 5–40)

## 2021-10-17 ENCOUNTER — Other Ambulatory Visit: Payer: Self-pay | Admitting: Internal Medicine

## 2021-10-17 DIAGNOSIS — K219 Gastro-esophageal reflux disease without esophagitis: Secondary | ICD-10-CM

## 2021-11-17 ENCOUNTER — Other Ambulatory Visit: Payer: Self-pay

## 2021-11-17 NOTE — Telephone Encounter (Signed)
gabapentin (NEURONTIN)   [id number] [place], [place] - [address]:  [phone] Fax:  [phone]

## 2021-11-18 MED ORDER — GABAPENTIN 600 MG PO TABS: 600.0000 mg | ORAL_TABLET | Freq: Four times a day (QID) | ORAL | 2 refills | Status: DC

## 2022-01-03 ENCOUNTER — Other Ambulatory Visit: Payer: Self-pay | Admitting: Obstetrics and Gynecology

## 2022-01-03 DIAGNOSIS — N3281 Overactive bladder: Secondary | ICD-10-CM

## 2022-02-14 ENCOUNTER — Telehealth: Payer: Self-pay | Admitting: *Deleted

## 2022-02-14 ENCOUNTER — Other Ambulatory Visit: Payer: Self-pay

## 2022-02-14 ENCOUNTER — Encounter: Payer: Self-pay | Admitting: Internal Medicine

## 2022-02-14 ENCOUNTER — Ambulatory Visit (INDEPENDENT_AMBULATORY_CARE_PROVIDER_SITE_OTHER): Payer: Medicare Other | Admitting: Internal Medicine

## 2022-02-14 VITALS — BP 122/71 | HR 81 | Temp 98.1°F | Resp 28 | Ht 67.0 in | Wt 199.1 lb

## 2022-02-14 DIAGNOSIS — M961 Postlaminectomy syndrome, not elsewhere classified: Secondary | ICD-10-CM

## 2022-02-14 DIAGNOSIS — E785 Hyperlipidemia, unspecified: Secondary | ICD-10-CM

## 2022-02-14 DIAGNOSIS — R911 Solitary pulmonary nodule: Secondary | ICD-10-CM | POA: Diagnosis not present

## 2022-02-14 DIAGNOSIS — M545 Low back pain, unspecified: Secondary | ICD-10-CM

## 2022-02-14 DIAGNOSIS — N3946 Mixed incontinence: Secondary | ICD-10-CM

## 2022-02-14 DIAGNOSIS — R918 Other nonspecific abnormal finding of lung field: Secondary | ICD-10-CM

## 2022-02-14 DIAGNOSIS — E7800 Pure hypercholesterolemia, unspecified: Secondary | ICD-10-CM

## 2022-02-14 MED ORDER — HYDROCODONE-ACETAMINOPHEN 5-325 MG PO TABS: 1.0000 | ORAL_TABLET | Freq: Four times a day (QID) | ORAL | 0 refills | Status: DC | PRN

## 2022-02-14 MED ORDER — GABAPENTIN 600 MG PO TABS: 600.0000 mg | ORAL_TABLET | Freq: Four times a day (QID) | ORAL | 2 refills | Status: DC

## 2022-02-14 MED ORDER — MIRABEGRON ER 25 MG PO TB24: 25.0000 mg | ORAL_TABLET | Freq: Every day | ORAL | 3 refills | Status: DC

## 2022-02-14 MED ORDER — ROSUVASTATIN CALCIUM 10 MG PO TABS: 10.0000 mg | ORAL_TABLET | Freq: Every day | ORAL | 3 refills | Status: DC

## 2022-02-14 NOTE — Progress Notes (Unsigned)
Subjective:  CC: chronic back pain, hypertension  HPI:  [name] is a 76 y.o. female with a past medical history stated below and presents today for chronic back pain and hypertension. Please see problem based assessment and plan for additional details.  Past Medical History:  Diagnosis Date   Abnormal finding 01/15/2018   Chronic back pain    "mostly lower; left foot is numb all the time" (11/22/2016)   Chronic kidney disease    ?renal cyst on MRI    GERD (gastroesophageal reflux disease)    High cholesterol    Lymphadenopathy    documented on chest CT scan 12/15/2005   Substance abuse ([facility])    tobacco   Viral upper respiratory tract infection with cough 08/11/2020   Viral URI 05/31/2011    Current Outpatient Medications on File Prior to Visit  Medication Sig Dispense Refill   albuterol (VENTOLIN HFA) 108 (90 Base) MCG/ACT inhaler INHALE 1-2 PUFFS BY MOUTH EVERY 6 HOURS AS NEEDED FOR WHEEZE OR SHORTNESS OF BREATH 8.5 each 2   amLODipine (NORVASC) 10 MG tablet Take 1 tablet (10 mg total) by mouth daily. 90 tablet 6   aspirin 81 MG chewable tablet Chew 81 mg by mouth daily.     fluticasone (FLONASE) 50 MCG/ACT nasal spray USE 1-2 SPRAYS IN EACH NOSTRIL DAILY 48 mL 3   metoprolol succinate (TOPROL-XL) 25 MG 24 hr tablet Take 25 mg by mouth daily.     pantoprazole (PROTONIX) 40 MG tablet TAKE 1 TABLET BY MOUTH EVERY DAY 90 tablet 2   Sodium Chloride-Sodium Bicarb (NETI POT SINUS WASH) 2300-700 MG KIT Place 1 kit into the nose daily. 1 kit 0   No current facility-administered medications on file prior to visit.    Family History  Problem Relation Age of Onset   Diabetes Father    Hypertension Father     Social History   Socioeconomic History   Marital status: Divorced    Spouse name: Not on file   Number of children: 0   Years of education: Not on file   Highest education level: Not on file  Occupational History    Employer: UNEMPLOYED  Tobacco Use    Smoking status: Former    Packs/day: 0.50    Years: 47.00    Total pack years: 23.50    Types: Cigarettes    Quit date: 05/13/2020    Years since quitting: 1.7   Smokeless tobacco: Never   Tobacco comments:    stopped around Thanksgiving   Vaping Use   Vaping Use: Never used  Substance and Sexual Activity   Alcohol use: Yes    Alcohol/week: 0.0 standard drinks of alcohol    Comment: 11/22/2016 "2 drinks q other holiday"   Drug use: Not Currently    Types: Marijuana    Comment: 66/2018 "I stopped in the 1970s"   Sexual activity: Yes  Other Topics Concern   Not on file  Social History Narrative   Patient's new Phone # [phone] and [phone].   Social Determinants of Health   Financial Resource Strain: Not on file  Food Insecurity: Not on file  Transportation Needs: Not on file  Physical Activity: Not on file  Stress: Not on file  Social Connections: Not on file  Intimate Partner Violence: Not on file    Review of Systems: ROS negative except for what is noted on the assessment and plan.  Objective:   Vitals:   02/14/22 0922  BP:  122/71  Pulse: 81  Resp: (!) 28  Temp: 98.1 F (36.7 C)  TempSrc: Oral  SpO2: 97%  Weight: 199 lb 1.6 oz (90.3 kg)  Height: 5' 7"  (1.702 m)    Physical Exam: Constitutional: well-appearing, sitting in chair but frequently shifts due to discomfort, in no acute distress Cardiovascular: regular rate and rhythm, no m/r/g Pulmonary/Chest: normal work of breathing on room air, lungs clear to auscultation bilaterally Abdominal: soft, non-tender, non-distended MSK: normal bulk and tone Neurological: alert & oriented x 3, 4/5 strength in left lower extremity, limited by pain, 5/5 in right lower extremity, antalgic gait Skin: warm and dry Psych: pleasant     Assessment & Plan:  Low back pain with radiation Patient presents with history of low back pain. History of C3-5 interbody fusion and posterior fixation about 10 years ago. Since then  she has had pain in lower back that travels down leg and previously followed with Dr. Nelva Bush ([facility]) where she was getting injections to spine with some relief every 6 months. Prior to that she was following with an orthopedist and was prescribed hydrocodone for low back pain. She currently takes tylenol, gabapentin to help with pain. The pain is worst in the morning and she has an aid that helps her with laundry and household chores as she is not longer able to complete these tasks.   On exam, patient appears uncomfortable while seated in chair and frequently shifts positions. She has a stooped posture with standing. She has muscle weakness due to pain in left leg 4/5 and right leg is 5/5 in muscle strength. Sensation is intact bilaterally. A: She was seen in [facility] 1/23 at which time she had complaint of worsening function 2/2 to back pain. She was referred back to Dr. Nelva Bush office and message received from [facility] was that he had no additional modalities to help her. He recommended referral to [facility]. I think that her chronic back pain is greatly affecting her function. She reports difficulty with daily tasks as well as wanting to get back to going to get the mail with less pain.  P: -Referral to Physical Therapy, she states that she went in the past and continues to do several exercises from that time now. -Continue tylenol/ gabapentin -start opioid therapy with hydrocodone- acetaminophen 5-326m q 8hrs. We talked about only taking medication when needed and discussed expectation that medication goal was to make her pain more tolerable but pain would not completely go away. -follow-up in 4 weeks, once on stable dose will do formal pain contract. I talked with patient about risks of opioid medication.  HLD (hyperlipidemia) Lipid Panel     Component Value Date/Time   CHOL 106 10/12/2021 0928   TRIG 72 10/12/2021 0928   HDL 28 (L) 10/12/2021 0928   CHOLHDL 3.8 10/12/2021  0928   CHOLHDL 4.1 11/23/2016 0424   VLDL 5 11/23/2016 0424   LDLCALC 63 10/12/2021 0928   LABVLDL 15 10/12/2021 0928   Patient follows with Dr. HTerrence Dupont He asked her last spring to decrease crestor medications from 20 mg to 10 mg. She states that since then she has been cutting pills in 1/2 and request for 10 mg dosing. With her history of infarct on NM scan 1/19 LDL goal is for secondary prevention with goal <70. Last LDL at goal at 63. She is unsure of exact time frame of dosing decrease. A/P: -request records from Dr. HZenia Residesoffice to see when dose was changed -rosuvastatin  10 mg sent to pharmacy.   Addendum 8/30: Note from Dr. Zenia Resides office 7/23 does not indicate time of dose change request.  -repeat Lipid panel at next office visit  Mixed incontinence urge and stress Patient went to urogyn this spring and myrbetriq was changed to gemtesa. She feels that this medication is not working as well for her. She has difficulty with making it to bathroom when she has to go and leaking with coughing, sneezing, and laughing. Presentation consistent with mixed urge and stress incontinence. -restart myrbetriq -discontinue gemtesa  Multiple pulmonary nodules Patient with prior history of smoking has abnormal pulmonary nodules on CT in 2016 with recommendation for follow-up. CT showed 6 mm nodule in left lower lobe and smaller nodule in right upper lobe. Her weight has been stable since that time. She denies night sweats, worsening cough, and feels generally well. -repeat CT chest    Patient discussed with Dr. Ennis Forts Bryen Hinderman, D.O. [facility]  PGY-2 Pager: [phone]  Phone: [phone] Date 02/15/2022  Time 7:45 AM

## 2022-02-14 NOTE — Telephone Encounter (Signed)
Received fax from [facility] - stating  for Hydrocodone  rx  the max is 7 days unless prior authorization is done (for the #90 tabs). Then after the 7 days a new rx is needed for the remainder of the rx. Thanks

## 2022-02-14 NOTE — Patient Instructions (Addendum)
Thank you, [name] for allowing [place] to provide your care today. Today we discussed:  Back pain: The goal is to get your pain to a level that you are more functional. Please continue taking gabapentin. I am also referring you back to physical therapy. I have sent in hydrocode 5-325, you can take this up to 3 times a day but only take when needed. Once we find a good dose for you we can do a pain contract.  Urinary incontinence: I am sending in mybetriq.   Cholesterol: I have sent in rosuvastatin 10 mg. We will need to repeat cholesterol at some point soon with decreasing the dose.  Lung nodules: Imaging from a few years ago showed a nodule. I would like to repeat imaging to monitor those.    I have ordered the following labs for you:  Lab Orders  No laboratory test(s) ordered today     Referrals ordered today:   Referral Orders         Ambulatory referral to Physical Therapy       I have ordered the following medication/changed the following medications:   Stop the following medications: Medications Discontinued During This Encounter  Medication Reason   amLODipine (NORVASC) 2.5 MG tablet Change in therapy   rosuvastatin (CRESTOR) 20 MG tablet Change in therapy   loratadine-pseudoephedrine (CLARITIN-D 24 HOUR) 10-240 MG 24 hr tablet Completed Course   GEMTESA 75 MG TABS Change in therapy     Start the following medications: Meds ordered this encounter  Medications   rosuvastatin (CRESTOR) 10 MG tablet    Sig: Take 1 tablet (10 mg total) by mouth daily.    Dispense:  90 tablet    Refill:  3   mirabegron ER (MYRBETRIQ) 25 MG TB24 tablet    Sig: Take 1 tablet (25 mg total) by mouth daily.    Dispense:  90 tablet    Refill:  3   HYDROcodone-acetaminophen (NORCO/VICODIN) 5-325 MG tablet    Sig: Take 1 tablet by mouth every 6 (six) hours as needed for moderate pain.    Dispense:  90 tablet    Refill:  0     Follow up:  1 month    We look forward to seeing  you next time. Please call our clinic at [phone] if you have any questions or concerns. The best time to call is Monday-Friday from 9am-4pm, but there is someone available 24/7. If after hours or the weekend, call the main hospital number and ask for the Internal Medicine Resident On-Call. If you need medication refills, please notify your pharmacy one week in advance and they will send [place] a request.   Thank you for trusting me with your care. Wishing you the best!   Christiana Fuchs, [place]

## 2022-02-15 NOTE — Assessment & Plan Note (Deleted)
2016 CT chest IMPRESSION: Mild COPD/emphysema.  Small right upper lobe and left lower lobe pulmonary nodules, the largest 6 mm in the left lower lobe. If the patient is at high risk for bronchogenic carcinoma, follow-up chest CT at 6-12 months is recommended. If the patient is at low risk for bronchogenic carcinoma, follow-up chest CT at 12 months is recommended.

## 2022-02-15 NOTE — Assessment & Plan Note (Addendum)
Lipid Panel     Component Value Date/Time   CHOL 106 10/12/2021 0928   TRIG 72 10/12/2021 0928   HDL 28 (L) 10/12/2021 0928   CHOLHDL 3.8 10/12/2021 0928   CHOLHDL 4.1 11/23/2016 0424   VLDL 5 11/23/2016 0424   LDLCALC 63 10/12/2021 0928   LABVLDL 15 10/12/2021 0928   Patient follows with Dr. Terrence Dupont. He asked her last spring to decrease crestor medications from 20 mg to 10 mg. She states that since then she has been cutting pills in 1/2 and request for 10 mg dosing. With her history of infarct on NM scan 1/19 LDL goal is for secondary prevention with goal <70. Last LDL at goal at 63. She is unsure of exact time frame of dosing decrease. A/P: -request records from Dr. Zenia Resides office to see when dose was changed -rosuvastatin 10 mg sent to pharmacy.   Addendum 8/30: Note from Dr. Zenia Resides office 7/23 does not indicate time of dose change request.  -repeat Lipid panel at next office visit

## 2022-02-15 NOTE — Assessment & Plan Note (Signed)
Patient presents with history of low back pain. History of C3-5 interbody fusion and posterior fixation about 10 years ago. Since then she has had pain in lower back that travels down leg and previously followed with Dr. Nelva Bush ([facility]) where she was getting injections to spine with some relief every 6 months. Prior to that she was following with an orthopedist and was prescribed hydrocodone for low back pain. She currently takes tylenol, gabapentin to help with pain. The pain is worst in the morning and she has an aid that helps her with laundry and household chores as she is not longer able to complete these tasks.   On exam, patient appears uncomfortable while seated in chair and frequently shifts positions. She has a stooped posture with standing. She has muscle weakness due to pain in left leg 4/5 and right leg is 5/5 in muscle strength. Sensation is intact bilaterally. A: She was seen in [facility] 1/23 at which time she had complaint of worsening function 2/2 to back pain. She was referred back to Dr. Nelva Bush office and message received from [facility] was that he had no additional modalities to help her. He recommended referral to [facility]. I think that her chronic back pain is greatly affecting her function. She reports difficulty with daily tasks as well as wanting to get back to going to get the mail with less pain.  P: -Referral to Physical Therapy, she states that she went in the past and continues to do several exercises from that time now. -Continue tylenol/ gabapentin -start opioid therapy with hydrocodone- acetaminophen 5-'325mg'$  q 8hrs. We talked about only taking medication when needed and discussed expectation that medication goal was to make her pain more tolerable but pain would not completely go away. -follow-up in 4 weeks, once on stable dose will do formal pain contract. I talked with patient about risks of opioid medication.

## 2022-02-15 NOTE — Assessment & Plan Note (Signed)
Patient with prior history of smoking has abnormal pulmonary nodules on CT in 2016 with recommendation for follow-up. CT showed 6 mm nodule in left lower lobe and smaller nodule in right upper lobe. Her weight has been stable since that time. She denies night sweats, worsening cough, and feels generally well. -repeat CT chest

## 2022-02-15 NOTE — Assessment & Plan Note (Signed)
Patient went to urogyn this spring and myrbetriq was changed to [employer]. She feels that this medication is not working as well for her. She has difficulty with making it to bathroom when she has to go and leaking with coughing, sneezing, and laughing. Presentation consistent with mixed urge and stress incontinence. -restart myrbetriq -discontinue gemtesa

## 2022-02-22 NOTE — Progress Notes (Signed)
Internal Medicine Clinic Attending  Case discussed with Dr. Masters  At the time of the visit.  We reviewed the resident's history and exam and pertinent patient test results.  I agree with the assessment, diagnosis, and plan of care documented in the resident's note.  

## 2022-02-23 NOTE — Telephone Encounter (Signed)
Need a PA request from the pharmacy

## 2022-03-14 ENCOUNTER — Encounter: Payer: Medicare Other | Admitting: Student

## 2022-03-14 ENCOUNTER — Encounter: Payer: Self-pay | Admitting: Internal Medicine

## 2022-03-15 ENCOUNTER — Ambulatory Visit (HOSPITAL_COMMUNITY): Payer: Medicare Other

## 2022-04-12 NOTE — Therapy (Unsigned)
OUTPATIENT PHYSICAL THERAPY THORACOLUMBAR EVALUATION   Patient Name: Laura [name] MRN: [id number] DOB:30-Apr-1946, 76 y.o., female Today's Date: 04/13/2022   PT End of Session - 04/13/22 0949     Visit Number 1    Number of Visits 16    Date for PT Re-Evaluation 06/08/22    PT Start Time 0947   pt late   PT Stop Time 1019    PT Time Calculation (min) 32 min    Activity Tolerance Patient tolerated treatment well    Behavior During Therapy [facility] for tasks assessed/performed             Past Medical History:  Diagnosis Date   Abnormal finding 01/15/2018   Chronic back pain    "mostly lower; left foot is numb all the time" (11/22/2016)   Chronic kidney disease    ?renal cyst on MRI    GERD (gastroesophageal reflux disease)    High cholesterol    Lymphadenopathy    documented on chest CT scan 12/15/2005   Substance abuse ([place])    tobacco   Viral upper respiratory tract infection with cough 08/11/2020   Viral URI 05/31/2011   Past Surgical History:  Procedure Laterality Date   ANTERIOR CERVICAL DISCECTOMY     C3-4, C4-5 diskectomy, fusion, plating and allograft,    BACK SURGERY     CARPAL TUNNEL WITH CUBITAL TUNNEL Left    LUMBAR DISC SURGERY  X 2-3   POSTERIOR CERVICAL FUSION/FORAMINOTOMY     C3-5 fusion with wirin   POSTERIOR LUMBAR FUSION     SPINE SURGERY     s/p C3-4, C4-5 diskectomy, fusion, plating and allograft, s/p posterior C3-5 fusion with wiring(02/11/2007 by Dr Lorin Mercy) had psudoarthrosis lumbar surgery 2000)   Patient Active Problem List   Diagnosis Date Noted   Tension headache 08/03/2021   Multiple pulmonary nodules 03/04/2020   PND (post-nasal drip) 12/09/2019   Encounter for screening mammogram for malignant neoplasm of breast 10/20/2019   Risk for falls 03/11/2018   Open-angle glaucoma 11/15/2017   Cramping of hands 11/15/2017   Plantar wart 08/06/2017   Mass of right ear canal 07/04/2017   Screening for osteoporosis 04/06/2017   Bronchitis due  to tobacco use 11/22/2016   Left hand pain 04/16/2014   Mixed incontinence urge and stress 10/06/2013   Chronic cough 08/04/2013   HTN (hypertension) 08/04/2013   Health care maintenance 12/31/2012   Post laminectomy syndrome 01/30/2012   Lumbago 01/30/2012   GERD (gastroesophageal reflux disease) 01/05/2011   HLD (hyperlipidemia) 05/18/2010   DEPRESSION 11/25/2009   Low back pain with radiation 04/16/2009   Tobacco use 03/12/2009   PERIPHERAL NEUROPATHY, LOWER EXTREMITY, LEFT 03/12/2009   Allergic rhinitis 10/09/2007   CARPAL TUNNEL SYNDROME, LEFT 09/12/2007    PCP: Axel Filler   REFERRING PROVIDER: Christiana Fuchs, DO   REFERRING DIAG:    Rationale for Evaluation and Treatment Rehabilitation  THERAPY DIAG:  Radiculopathy, lumbar region  Pain in left leg  Muscle weakness (generalized)  ONSET DATE: chronic     M96.1 (ICD-10-CM) - Post laminectomy syndrome    SUBJECTIVE:  SUBJECTIVE STATEMENT: Pain reports to PT with chronic back pain which has become more limiting in the past 3-4 mos. She has a history of lumbar fixation by Dr. Nelva Bush in 2000.  The pain limits her ability to walk, stand and even sit comfortably at times.  The pain radiates to LLE (thigh , knee) especially in AM. She has to lean forward to relieve her pain. She is unable to walk comfortably.  She reports LE weakness in LLE, with LLE "giving out" recently.  She walks with a cane 95% of the time but no fall. LLE burning with sensory disturbance in LLE .  She used to get injections about 2 x per year but has not had any in 3-4 yrs.    PERTINENT HISTORY: BY other provider:  Patient presents with history of low back pain. History of C3-5 interbody fusion and posterior fixation about 10 years ago. Since then she has had  pain in lower back that travels down leg and previously followed with Dr. Nelva Bush ([facility]) where she was getting injections to spine with some relief every 6 months. Prior to that she was following with an orthopedist and was prescribed hydrocodone for low back pain. She currently takes tylenol, gabapentin to help with pain. The pain is worst in the morning and she has an aid that helps her with laundry and household chores as she is not longer able to complete these tasks.   See above for more info on history.   PAIN:  Are you having pain? Yes: NPRS scale: 8/10 Pain location: back and LLE  Pain description: sore and stiff Aggravating factors: standing, walking  Relieving factors: sitting, repositioning, laying down   PRECAUTIONS: None  WEIGHT BEARING RESTRICTIONS: No  FALLS:  Has patient fallen in last 6 months? No  LIVING ENVIRONMENT: Lives with: lives alone Lives in: House/apartment Stairs: No Has following equipment at home: Single point cane, Environmental consultant - 2 wheeled, and Environmental consultant - 4 wheeled  OCCUPATION: not working, was Manufacturing engineer.   PLOF: Independent with basic ADLs, Independent with household mobility with device, Independent with community mobility with device, Needs assistance with ADLs, and Needs assistance with homemaking  PATIENT GOALS: Pain relief , move better, walk better    OBJECTIVE:   DIAGNOSTIC FINDINGS:  None recent, pt states she may have a referral for MRI   PATIENT SURVEYS:  FOTO 36%  SCREENING FOR RED FLAGS: Bowel or bladder incontinence: No Spinal tumors: No Cauda equina syndrome: No Compression fracture: No Abdominal aneurysm: No  COGNITION: Overall cognitive status: Within functional limits for tasks assessed     SENSATION: WFL  MUSCLE LENGTH: Hamstrings: tight bilaterally  Thomas test: NT but appears tight   POSTURE: forward head, increased thoracic kyphosis, flexed trunk , and forward head, elevate shoulders    PALPATION: TTP  lumbar L side > Rt.  TTP lateral post. Hip, thigh  LUMBAR ROM:   AROM eval  Flexion WFL  + Gower's sign  Extension Lacks 10-15 deg from neutral   Right lateral flexion   Left lateral flexion   Right rotation 50% limited , pain  Left rotation 50% limited , pain   (Blank rows = not tested)  LOWER EXTREMITY ROM:     Passive  Right eval Left eval  Hip flexion WNL WNL   Hip extension limited limited  Hip abduction    Hip adduction    Hip internal rotation WFL tight  Hip external rotation WFL  Tight   Knee  flexion    Knee extension Tight  tight  Ankle dorsiflexion Limited  Limited   Ankle plantarflexion    Ankle inversion    Ankle eversion     (Blank rows = not tested)  LOWER EXTREMITY MMT:    MMT Right eval Left eval  Hip flexion 4+ 3+  Hip extension    Hip abduction 4 3  Hip adduction    Hip internal rotation    Hip external rotation    Knee flexion 5 4-  Knee extension 5 4-  Ankle dorsiflexion 5 4  Ankle plantarflexion    Ankle inversion    Ankle eversion     (Blank rows = not tested)  LUMBAR SPECIAL TESTS:  Straight leg raise test: Negative  FUNCTIONAL TESTS:  NT on eval due to time constraints   GAIT: Distance walked: 100 Assistive device utilized: Single point cane Level of assistance: Modified independence Comments: flexed forward, min antalgic gait LLE   TODAY'S TREATMENT:                                                                                                 DATE: 04/13/22  PT eval. HEP given and performed 1-5 reps each, POC discussed.  Limited due to late arrival   PATIENT EDUCATION:  Education details: HEP, POC  Person educated: Patient Education method: Explanation, Demonstration, and Handouts Education comprehension: verbalized understanding, returned demonstration, and needs further education  HOME EXERCISE PROGRAM: Access Code: [email or web address] URL: [email or web address] Date: 04/13/2022 Prepared by: Raeford Razor  Exercises - Supine Lower Trunk Rotation  - 2 x daily - 7 x weekly - 2 sets - 10 reps - 10 hold - Supine Single Knee to Chest Stretch  - 2 x daily - 7 x weekly - 1 sets - 3-5 reps - 30 hold - Supine Hamstring Stretch with Strap  - 2 x daily - 7 x weekly - 1 sets - 3-5 reps - 30 hold - Supine Bridge  - 2 x daily - 7 x weekly - 2 sets - 10 reps - 5 hold  ASSESSMENT:  CLINICAL IMPRESSION: Patient is a 76 y.o. female who was seen today for physical therapy evaluation and treatment for back pain, progressing after 20 yrs post laminectomy. She appeared to be in a great deal of pain today and after a simple eval, she was able to report feeling better about her back, walked out with a bit less pain overall.   OBJECTIVE IMPAIRMENTS: Abnormal gait, decreased activity tolerance, decreased balance, decreased endurance, decreased knowledge of use of DME, decreased mobility, difficulty walking, decreased ROM, decreased strength, hypomobility, increased fascial restrictions, impaired flexibility, postural dysfunction, and pain.   ACTIVITY LIMITATIONS: carrying, lifting, bending, sitting, standing, squatting, stairs, transfers, bed mobility, and locomotion level  PARTICIPATION LIMITATIONS: meal prep, cleaning, interpersonal relationship, shopping, and community activity  PERSONAL FACTORS: Past/current experiences and 3+ comorbidities: chronic back pain/surgery, cervical fusion, neuropathy  are also affecting patient's functional outcome.   REHAB POTENTIAL: Excellent  CLINICAL DECISION MAKING: Evolving/moderate complexity  EVALUATION COMPLEXITY: Moderate   GOALS: Goals reviewed with patient? Yes  SHORT  TERM GOALS: Target date: 05/11/2022  Pt will be I with initial HEP for trunk/hips and core Baseline: Goal status: INITIAL  2.  Pt will be screened for balance and functional testing, goals set (2 min walk, 5 x STS) Baseline:  Goal status: INITIAL  3.  Pt will report improvement in back and  leg pain (20-25%) with ADLs.  Baseline:  Goal status: INITIAL   LONG TERM GOALS: Target date: 06/08/2022  Pt will be I with final HEP upon discharge for longer lasting mobility and health  Baseline:  Goal status: INITIAL  2.  FOTO score will improve to 46% or better to demo functional improvement  Baseline:  Goal status: INITIAL  3.  Pt will be able to stand for light housework, meal prep 15-20 min with pain no more than 4/10 in back and LLE .  Baseline:  Goal status: INITIAL  4.  Pt will improve LLE strength to 5/5 in hip, knee for optimal tranfers, gait and function.   Baseline:  Goal status: INITIAL  5.  Pt will improve trunk extension in standing with min cues and pain minimal.  Baseline:  Goal status: INITIAL  PLAN:  PT FREQUENCY: 2x/week  PT DURATION: 8 weeks  PLANNED INTERVENTIONS: Therapeutic exercises, Therapeutic activity, Neuromuscular re-education, Balance training, Gait training, Patient/Family education, Self Care, Joint mobilization, Aquatic Therapy, Dry Needling, Electrical stimulation, Spinal mobilization, Cryotherapy, Moist heat, Manual therapy, and Re-evaluation.  PLAN FOR NEXT SESSION: check HEP, nustep, check hip flexor length . 2 min walk, STS    Shanica Castellanos, PT 04/13/2022, 10:26 AM   Raeford Razor, PT 04/13/22 11:05 AM Phone: [phone] Fax: [phone]

## 2022-04-13 ENCOUNTER — Ambulatory Visit
Payer: Medicare Other | Attending: Student in an Organized Health Care Education/Training Program | Admitting: Physical Therapy

## 2022-04-13 DIAGNOSIS — M79605 Pain in left leg: Secondary | ICD-10-CM | POA: Insufficient documentation

## 2022-04-13 DIAGNOSIS — M6281 Muscle weakness (generalized): Secondary | ICD-10-CM | POA: Insufficient documentation

## 2022-04-13 DIAGNOSIS — M5416 Radiculopathy, lumbar region: Secondary | ICD-10-CM | POA: Insufficient documentation

## 2022-04-13 DIAGNOSIS — M961 Postlaminectomy syndrome, not elsewhere classified: Secondary | ICD-10-CM | POA: Diagnosis not present

## 2022-04-17 ENCOUNTER — Encounter: Payer: Self-pay | Admitting: Physical Therapy

## 2022-04-17 ENCOUNTER — Ambulatory Visit: Payer: Medicare Other | Admitting: Physical Therapy

## 2022-04-17 DIAGNOSIS — M5416 Radiculopathy, lumbar region: Secondary | ICD-10-CM

## 2022-04-17 DIAGNOSIS — M6281 Muscle weakness (generalized): Secondary | ICD-10-CM

## 2022-04-17 DIAGNOSIS — M79605 Pain in left leg: Secondary | ICD-10-CM

## 2022-04-17 NOTE — Therapy (Signed)
OUTPATIENT PHYSICAL THERAPY TREATMENT NOTE   Patient Name: Laura [name] MRN: [id number] DOB:11/26/1945, 76 y.o., female Today's Date: 04/17/2022  PCP: Axel Filler    REFERRING PROVIDER: Christiana Fuchs, DO   END OF SESSION:   PT End of Session - 04/17/22 1015     Visit Number 2    Number of Visits 16    Date for PT Re-Evaluation 06/08/22    PT Start Time 5456    PT Stop Time 1055    PT Time Calculation (min) 40 min             Past Medical History:  Diagnosis Date   Abnormal finding 01/15/2018   Chronic back pain    "mostly lower; left foot is numb all the time" (11/22/2016)   Chronic kidney disease    ?renal cyst on MRI    GERD (gastroesophageal reflux disease)    High cholesterol    Lymphadenopathy    documented on chest CT scan 12/15/2005   Substance abuse ([place])    tobacco   Viral upper respiratory tract infection with cough 08/11/2020   Viral URI 05/31/2011   Past Surgical History:  Procedure Laterality Date   ANTERIOR CERVICAL DISCECTOMY     C3-4, C4-5 diskectomy, fusion, plating and allograft,    BACK SURGERY     CARPAL TUNNEL WITH CUBITAL TUNNEL Left    LUMBAR DISC SURGERY  X 2-3   POSTERIOR CERVICAL FUSION/FORAMINOTOMY     C3-5 fusion with wirin   POSTERIOR LUMBAR FUSION     SPINE SURGERY     s/p C3-4, C4-5 diskectomy, fusion, plating and allograft, s/p posterior C3-5 fusion with wiring(02/11/2007 by Dr Lorin Mercy) had psudoarthrosis lumbar surgery 2000)   Patient Active Problem List   Diagnosis Date Noted   Tension headache 08/03/2021   Multiple pulmonary nodules 03/04/2020   PND (post-nasal drip) 12/09/2019   Encounter for screening mammogram for malignant neoplasm of breast 10/20/2019   Risk for falls 03/11/2018   Open-angle glaucoma 11/15/2017   Cramping of hands 11/15/2017   Plantar wart 08/06/2017   Mass of right ear canal 07/04/2017   Screening for osteoporosis 04/06/2017   Bronchitis due to tobacco use 11/22/2016   Left hand  pain 04/16/2014   Mixed incontinence urge and stress 10/06/2013   Chronic cough 08/04/2013   HTN (hypertension) 08/04/2013   Health care maintenance 12/31/2012   Post laminectomy syndrome 01/30/2012   Lumbago 01/30/2012   GERD (gastroesophageal reflux disease) 01/05/2011   HLD (hyperlipidemia) 05/18/2010   DEPRESSION 11/25/2009   Low back pain with radiation 04/16/2009   Tobacco use 03/12/2009   PERIPHERAL NEUROPATHY, LOWER EXTREMITY, LEFT 03/12/2009   Allergic rhinitis 10/09/2007   CARPAL TUNNEL SYNDROME, LEFT 09/12/2007    REFERRING DIAG: M96.1 (ICD-10-CM) - Post laminectomy syndrome  THERAPY DIAG:  Radiculopathy, lumbar region  Pain in left leg  Muscle weakness (generalized)  Rationale for Evaluation and Treatment Rehabilitation  PERTINENT HISTORY: BY other provider:  Patient presents with history of low back pain. History of C3-5 interbody fusion and posterior fixation about 10 years ago. Since then she has had pain in lower back that travels down leg and previously followed with Dr. Nelva Bush ([facility]) where she was getting injections to spine with some relief every 6 months. Prior to that she was following with an orthopedist and was prescribed hydrocodone for low back pain. She currently takes tylenol, gabapentin to help with pain. The pain is worst in the morning and she has an aid  that helps her with laundry and household chores as she is not longer able to complete these tasks.    See above for more info on history.   PRECAUTIONS: None   SUBJECTIVE:                                                                                                                                                                                      SUBJECTIVE STATEMENT:  I felt a little sore the next day after last visit. I tried to do a little of the exercises.    PAIN:  Are you having pain? Yes: NPRS scale: 7/10 Pain location: back and LLE  Pain description: sore and stiff Aggravating  factors: standing, walking  Relieving factors: sitting, repositioning, laying down    OBJECTIVE: (objective measures completed at initial evaluation unless otherwise dated)  DIAGNOSTIC FINDINGS:  None recent, pt states she may have a referral for MRI    PATIENT SURVEYS:  FOTO 36%   SCREENING FOR RED FLAGS: Bowel or bladder incontinence: No Spinal tumors: No Cauda equina syndrome: No Compression fracture: No Abdominal aneurysm: No   COGNITION: Overall cognitive status: Within functional limits for tasks assessed                          SENSATION: WFL   MUSCLE LENGTH: Hamstrings: tight bilaterally  Thomas test: NT but appears tight    POSTURE: forward head, increased thoracic kyphosis, flexed trunk , and forward head, elevate shoulders     PALPATION: TTP lumbar L side > Rt.  TTP lateral post. Hip, thigh   LUMBAR ROM:    AROM eval  Flexion WFL  + Gower's sign  Extension Lacks 10-15 deg from neutral   Right lateral flexion    Left lateral flexion    Right rotation 50% limited , pain  Left rotation 50% limited , pain   (Blank rows = not tested)   LOWER EXTREMITY ROM:      Passive  Right eval Left eval  Hip flexion WNL WNL   Hip extension limited limited  Hip abduction      Hip adduction      Hip internal rotation WFL tight  Hip external rotation WFL  Tight   Knee flexion      Knee extension Tight  tight  Ankle dorsiflexion Limited  Limited   Ankle plantarflexion      Ankle inversion      Ankle eversion       (Blank rows = not tested)   LOWER EXTREMITY MMT:     MMT Right eval Left eval  Hip flexion 4+  3+  Hip extension      Hip abduction 4 3  Hip adduction      Hip internal rotation      Hip external rotation      Knee flexion 5 4-  Knee extension 5 4-  Ankle dorsiflexion 5 4  Ankle plantarflexion      Ankle inversion      Ankle eversion       (Blank rows = not tested)   LUMBAR SPECIAL TESTS:  Straight leg raise test: Negative    FUNCTIONAL TESTS:  NT on eval due to time constraints  04/17/22: 5 x STS 23.8 sec , bariatric chair, with UE 04/17/22: 2 MWT: 368 feet  with SPC   GAIT: Distance walked: 100 Assistive device utilized: Single point cane Level of assistance: Modified independence Comments: flexed forward, min antalgic gait LLE    [facility] Adult PT Treatment:                                                DATE: 04/17/22 Therapeutic Exercise: Nustep L3 UE/LE x 5 minutes  Bridge 10 x 1 , 3 sec SKTC, 3 each, 30 sec Bridge 10 x 1, 3 sec Hamstring stretch with strap x 3 each, 30 sec LTR 10 sec x 4 each Supne March with ab brace x each Hooklying clam with red band x 15    Therapeutic Activity: 5 x STS: 23.8 with UE 2 MWT: 368 feet     TODAY'S TREATMENT:                                                                                                 DATE: 04/13/22  PT eval. HEP given and performed 1-5 reps each, POC discussed.  Limited due to late arrival    PATIENT EDUCATION:  Education details: HEP, POC  Person educated: Patient Education method: Explanation, Demonstration, and Handouts Education comprehension: verbalized understanding, returned demonstration, and needs further education   HOME EXERCISE PROGRAM: Access Code: [email or web address] URL: [email or web address] Date: 04/13/2022 Prepared by: Raeford Razor   Exercises - Supine Lower Trunk Rotation  - 2 x daily - 7 x weekly - 2 sets - 10 reps - 10 hold - Supine Single Knee to Chest Stretch  - 2 x daily - 7 x weekly - 1 sets - 3-5 reps - 30 hold - Supine Hamstring Stretch with Strap  - 2 x daily - 7 x weekly - 1 sets - 3-5 reps - 30 hold - Supine Bridge  - 2 x daily - 7 x weekly - 2 sets - 10 reps - 5 hold   ASSESSMENT:   CLINICAL IMPRESSION: Patient is a 76 y.o. female who was seen today for physical therapy  treatment for back pain, progressing after 20 yrs post laminectomy. She reports compliance with HEP and only min soreness  after first visit. Captured 2 MWT and 5 x STS today, STG#2 met. She reported increased fatigue and leg weakness with  2 MWT and required rest break afterward. Began Nustep and reviewed HEP. She reports difficulty with bridge exercises, able to perform with stretch break in between sets. Progressed with gentle core and hip strength. She tolerated this well and was encouraged to complete HEP.     OBJECTIVE IMPAIRMENTS: Abnormal gait, decreased activity tolerance, decreased balance, decreased endurance, decreased knowledge of use of DME, decreased mobility, difficulty walking, decreased ROM, decreased strength, hypomobility, increased fascial restrictions, impaired flexibility, postural dysfunction, and pain.    ACTIVITY LIMITATIONS: carrying, lifting, bending, sitting, standing, squatting, stairs, transfers, bed mobility, and locomotion level   PARTICIPATION LIMITATIONS: meal prep, cleaning, interpersonal relationship, shopping, and community activity   PERSONAL FACTORS: Past/current experiences and 3+ comorbidities: chronic back pain/surgery, cervical fusion, neuropathy  are also affecting patient's functional outcome.    REHAB POTENTIAL: Excellent   CLINICAL DECISION MAKING: Evolving/moderate complexity   EVALUATION COMPLEXITY: Moderate     GOALS: Goals reviewed with patient? Yes   SHORT TERM GOALS: Target date: 05/11/2022   Pt will be I with initial HEP for trunk/hips and core Baseline: Goal status: INITIAL   2.  Pt will be screened for balance and functional testing, goals set (2 min walk, 5 x STS) Baseline: not captured Status: captured 04/17/22- see functional testing  Goal status: MET   3.  Pt will report improvement in back and leg pain (20-25%) with ADLs.  Baseline:  Goal status: INITIAL     LONG TERM GOALS: Target date: 06/08/2022   Pt will be I with final HEP upon discharge for longer lasting mobility and health  Baseline:  Goal status: INITIAL   2.  FOTO score will  improve to 46% or better to demo functional improvement  Baseline:  Goal status: INITIAL   3.  Pt will be able to stand for light housework, meal prep 15-20 min with pain no more than 4/10 in back and LLE .  Baseline:  Goal status: INITIAL   4.  Pt will improve LLE strength to 5/5 in hip, knee for optimal tranfers, gait and function.   Baseline:  Goal status: INITIAL   5.  Pt will improve trunk extension in standing with min cues and pain minimal.  Baseline:  Goal status: INITIAL   PLAN:   PT FREQUENCY: 2x/week   PT DURATION: 8 weeks   PLANNED INTERVENTIONS: Therapeutic exercises, Therapeutic activity, Neuromuscular re-education, Balance training, Gait training, Patient/Family education, Self Care, Joint mobilization, Aquatic Therapy, Dry Needling, Electrical stimulation, Spinal mobilization, Cryotherapy, Moist heat, Manual therapy, and Re-evaluation.   PLAN FOR NEXT SESSION: check HEP, nustep, check hip flexor length .    Hessie Diener, PTA 04/17/22 1:36 PM Phone: [phone] Fax: [phone]

## 2022-04-20 ENCOUNTER — Ambulatory Visit: Payer: Medicare Other | Admitting: Physical Therapy

## 2022-04-24 NOTE — Therapy (Unsigned)
OUTPATIENT PHYSICAL THERAPY TREATMENT NOTE   Patient Name: Laura [name] MRN: [id number] DOB:Apr 03, 1946, 76 y.o., female Today's Date: 04/25/2022  PCP: Axel Filler    REFERRING PROVIDER: Christiana Fuchs, DO   END OF SESSION:   PT End of Session - 04/25/22 1254     Visit Number 3    Number of Visits 16    Date for PT Re-Evaluation 06/08/22    Authorization Type [employer] medicare and MCD    PT Start Time 1019    PT Stop Time 1110    PT Time Calculation (min) 51 min    Activity Tolerance Patient tolerated treatment well    Behavior During Therapy WFL for tasks assessed/performed              Past Medical History:  Diagnosis Date   Abnormal finding 01/15/2018   Chronic back pain    "mostly lower; left foot is numb all the time" (11/22/2016)   Chronic kidney disease    ?renal cyst on MRI    GERD (gastroesophageal reflux disease)    High cholesterol    Lymphadenopathy    documented on chest CT scan 12/15/2005   Substance abuse ([place])    tobacco   Viral upper respiratory tract infection with cough 08/11/2020   Viral URI 05/31/2011   Past Surgical History:  Procedure Laterality Date   ANTERIOR CERVICAL DISCECTOMY     C3-4, C4-5 diskectomy, fusion, plating and allograft,    BACK SURGERY     CARPAL TUNNEL WITH CUBITAL TUNNEL Left    LUMBAR DISC SURGERY  X 2-3   POSTERIOR CERVICAL FUSION/FORAMINOTOMY     C3-5 fusion with wirin   POSTERIOR LUMBAR FUSION     SPINE SURGERY     s/p C3-4, C4-5 diskectomy, fusion, plating and allograft, s/p posterior C3-5 fusion with wiring(02/11/2007 by Dr Lorin Mercy) had psudoarthrosis lumbar surgery 2000)   Patient Active Problem List   Diagnosis Date Noted   Tension headache 08/03/2021   Multiple pulmonary nodules 03/04/2020   PND (post-nasal drip) 12/09/2019   Encounter for screening mammogram for malignant neoplasm of breast 10/20/2019   Risk for falls 03/11/2018   Open-angle glaucoma 11/15/2017   Cramping of hands 11/15/2017    Plantar wart 08/06/2017   Mass of right ear canal 07/04/2017   Screening for osteoporosis 04/06/2017   Bronchitis due to tobacco use 11/22/2016   Left hand pain 04/16/2014   Mixed incontinence urge and stress 10/06/2013   Chronic cough 08/04/2013   HTN (hypertension) 08/04/2013   Health care maintenance 12/31/2012   Post laminectomy syndrome 01/30/2012   Lumbago 01/30/2012   GERD (gastroesophageal reflux disease) 01/05/2011   HLD (hyperlipidemia) 05/18/2010   DEPRESSION 11/25/2009   Low back pain with radiation 04/16/2009   Tobacco use 03/12/2009   PERIPHERAL NEUROPATHY, LOWER EXTREMITY, LEFT 03/12/2009   Allergic rhinitis 10/09/2007   CARPAL TUNNEL SYNDROME, LEFT 09/12/2007    REFERRING DIAG: M96.1 (ICD-10-CM) - Post laminectomy syndrome  THERAPY DIAG:  Radiculopathy, lumbar region  Pain in left leg  Muscle weakness (generalized)  Rationale for Evaluation and Treatment Rehabilitation  PERTINENT HISTORY: BY other provider:  Patient presents with history of low back pain. History of C3-5 interbody fusion and posterior fixation about 10 years ago. Since then she has had pain in lower back that travels down leg and previously followed with Dr. Nelva Bush ([facility]) where she was getting injections to spine with some relief every 6 months. Prior to that she was following with an orthopedist  and was prescribed hydrocodone for low back pain. She currently takes tylenol, gabapentin to help with pain. The pain is worst in the morning and she has an aid that helps her with laundry and household chores as she is not longer able to complete these tasks.    See above for more info on history.   PRECAUTIONS: None   SUBJECTIVE:                                                                                                                                                                                      SUBJECTIVE STATEMENT: Pain is not so bad today is about a 4 out of 10.  She came yesterday but  it was the wrong day.  PAIN:  Are you having pain? Yes: NPRS scale: 4/10 Pain location: back and LLE  Pain description: sore and stiff Aggravating factors: standing, walking  Relieving factors: sitting, repositioning, laying down    OBJECTIVE: (objective measures completed at initial evaluation unless otherwise dated)  DIAGNOSTIC FINDINGS:  None recent, pt states she may have a referral for MRI    PATIENT SURVEYS:  FOTO 36%   SCREENING FOR RED FLAGS: Bowel or bladder incontinence: No Spinal tumors: No Cauda equina syndrome: No Compression fracture: No Abdominal aneurysm: No   COGNITION: Overall cognitive status: Within functional limits for tasks assessed                          SENSATION: WFL   MUSCLE LENGTH: Hamstrings: tight bilaterally  Thomas test: NT but appears tight    POSTURE: forward head, increased thoracic kyphosis, flexed trunk , and forward head, elevate shoulders     PALPATION: TTP lumbar L side > Rt.  TTP lateral post. Hip, thigh   LUMBAR ROM:    AROM eval  Flexion WFL  + Gower's sign  Extension Lacks 10-15 deg from neutral   Right lateral flexion    Left lateral flexion    Right rotation 50% limited , pain  Left rotation 50% limited , pain   (Blank rows = not tested)   LOWER EXTREMITY ROM:      Passive  Right eval Left eval  Hip flexion WNL WNL   Hip extension limited limited  Hip abduction      Hip adduction      Hip internal rotation WFL tight  Hip external rotation WFL  Tight   Knee flexion      Knee extension Tight  tight  Ankle dorsiflexion Limited  Limited   Ankle plantarflexion      Ankle inversion      Ankle eversion       (  Blank rows = not tested)   LOWER EXTREMITY MMT:     MMT Right eval Left eval  Hip flexion 4+ 3+  Hip extension      Hip abduction 4 3  Hip adduction      Hip internal rotation      Hip external rotation      Knee flexion 5 4-  Knee extension 5 4-  Ankle dorsiflexion 5 4  Ankle  plantarflexion      Ankle inversion      Ankle eversion       (Blank rows = not tested)   LUMBAR SPECIAL TESTS:  Straight leg raise test: Negative   FUNCTIONAL TESTS:  NT on eval due to time constraints  04/17/22: 5 x STS 23.8 sec , bariatric chair, with UE 04/17/22: 2 MWT: 368 feet  with SPC   GAIT: Distance walked: 100 Assistive device utilized: Single point cane Level of assistance: Modified independence Comments: flexed forward, min antalgic gait LLE   [facility] Adult PT Treatment:                                                DATE: 04/25/22 Therapeutic Exercise: NuStep Level 6 UE and LE for 6 min  LAQ , 4 lbs x 20  Bridge 10 x 1 , 3 sec SKTC, 3 each, 30 sec Bridge 10 x 1, 3 sec Hamstring stretch with strap x 3 each, 30 sec LTR 10 sec x 4 each Standing hip extension leaning on high mat table x10 each side Anterior hip stretch in standing cues to posteriorly rotate pelvis Modalities: MHP 10 min supine legs elevated.  Self Care: Heat versus ice [facility] Adult PT Treatment:                                                DATE: 04/17/22 Therapeutic Exercise: Nustep L3 UE/LE x 5 minutes  Bridge 10 x 1 , 3 sec SKTC, 3 each, 30 sec Bridge 10 x 1, 3 sec Hamstring stretch with strap x 3 each, 30 sec LTR 10 sec x 4 each Supne March with ab brace x each Hooklying clam with red band x 15    Therapeutic Activity: 5 x STS: 23.8 with UE 2 MWT: 368 feet     TODAY'S TREATMENT:                                                                                                 DATE: 04/13/22  PT eval. HEP given and performed 1-5 reps each, POC discussed.  Limited due to late arrival    PATIENT EDUCATION:  Education details: HEP, POC  Person educated: Patient Education method: Explanation, Demonstration, and Handouts Education comprehension: verbalized understanding, returned demonstration, and needs further education   HOME EXERCISE PROGRAM: Access Code: [email or web address] URL:  [email or web address] Date: 04/13/2022  Prepared by: Raeford Razor   Exercises - Supine Lower Trunk Rotation  - 2 x daily - 7 x weekly - 2 sets - 10 reps - 10 hold - Supine Single Knee to Chest Stretch  - 2 x daily - 7 x weekly - 1 sets - 3-5 reps - 30 hold - Supine Hamstring Stretch with Strap  - 2 x daily - 7 x weekly - 1 sets - 3-5 reps - 30 hold - Supine Bridge  - 2 x daily - 7 x weekly - 2 sets - 10 reps - 5 hold   ASSESSMENT:   CLINICAL IMPRESSION: Patient with less pain overall today.  Once cued for abdominal activation and posterior pelvic tilts, she was able to do a partial bridge with less pain.  She is very limited in her ability to walk even short distances due to back and leg pain.  Offered moist heat today to reduce joint stiffness and lumbar spine.  Last week when she was diligent with her exercises she did report feeling much better.  Patient is motivated to improve and will continue to benefit from skilled physical therapy.  She may benefit from updated imaging at this time.   OBJECTIVE IMPAIRMENTS: Abnormal gait, decreased activity tolerance, decreased balance, decreased endurance, decreased knowledge of use of DME, decreased mobility, difficulty walking, decreased ROM, decreased strength, hypomobility, increased fascial restrictions, impaired flexibility, postural dysfunction, and pain.    ACTIVITY LIMITATIONS: carrying, lifting, bending, sitting, standing, squatting, stairs, transfers, bed mobility, and locomotion level   PARTICIPATION LIMITATIONS: meal prep, cleaning, interpersonal relationship, shopping, and community activity   PERSONAL FACTORS: Past/current experiences and 3+ comorbidities: chronic back pain/surgery, cervical fusion, neuropathy  are also affecting patient's functional outcome.    REHAB POTENTIAL: Excellent   CLINICAL DECISION MAKING: Evolving/moderate complexity   EVALUATION COMPLEXITY: Moderate     GOALS: Goals reviewed with patient?  Yes   SHORT TERM GOALS: Target date: 05/11/2022   Pt will be I with initial HEP for trunk/hips and core Baseline: Goal status: INITIAL   2.  Pt will be screened for balance and functional testing, goals set (2 min walk, 5 x STS) Baseline: not captured Status: captured 04/17/22- see functional testing  Goal status: MET   3.  Pt will report improvement in back and leg pain (20-25%) with ADLs.  Baseline:  Goal status: INITIAL     LONG TERM GOALS: Target date: 06/08/2022   Pt will be I with final HEP upon discharge for longer lasting mobility and health  Baseline:  Goal status: INITIAL   2.  FOTO score will improve to 46% or better to demo functional improvement  Baseline:  Goal status: INITIAL   3.  Pt will be able to stand for light housework, meal prep 15-20 min with pain no more than 4/10 in back and LLE .  Baseline:  Goal status: INITIAL   4.  Pt will improve LLE strength to 5/5 in hip, knee for optimal transfers, gait and function.   Baseline:  Goal status: INITIAL   5.  Pt will improve trunk extension in standing with min cues and pain minimal.  Baseline:  Goal status: INITIAL  6. Patient will be able to walk to her mailbox with no more than min pain in low back Baseline: severe pain Goal status NEW PLAN:   PT FREQUENCY: 2x/week   PT DURATION: 8 weeks   PLANNED INTERVENTIONS: Therapeutic exercises, Therapeutic activity, Neuromuscular re-education, Balance training, Gait training, Patient/Family education, Self  Care, Joint mobilization, Aquatic Therapy, Dry Needling, Electrical stimulation, Spinal mobilization, Cryotherapy, Moist heat, Manual therapy, and Re-evaluation.   PLAN FOR NEXT SESSION: walking, standing as tolerated.  nustep, work towards goals,  LLE strength   Raeford Razor, PT 04/25/22 12:55 PM Phone: [phone] Fax: [phone]

## 2022-04-25 ENCOUNTER — Ambulatory Visit
Payer: Medicare Other | Attending: Student in an Organized Health Care Education/Training Program | Admitting: Physical Therapy

## 2022-04-25 DIAGNOSIS — M79605 Pain in left leg: Secondary | ICD-10-CM | POA: Diagnosis present

## 2022-04-25 DIAGNOSIS — M5416 Radiculopathy, lumbar region: Secondary | ICD-10-CM | POA: Insufficient documentation

## 2022-04-25 DIAGNOSIS — M6281 Muscle weakness (generalized): Secondary | ICD-10-CM | POA: Insufficient documentation

## 2022-04-27 ENCOUNTER — Encounter: Payer: Self-pay | Admitting: Physical Therapy

## 2022-04-27 ENCOUNTER — Ambulatory Visit: Payer: Medicare Other | Admitting: Physical Therapy

## 2022-04-27 DIAGNOSIS — M79605 Pain in left leg: Secondary | ICD-10-CM

## 2022-04-27 DIAGNOSIS — M5416 Radiculopathy, lumbar region: Secondary | ICD-10-CM

## 2022-04-27 DIAGNOSIS — M6281 Muscle weakness (generalized): Secondary | ICD-10-CM

## 2022-04-27 NOTE — Therapy (Signed)
OUTPATIENT PHYSICAL THERAPY TREATMENT NOTE   Patient Name: Laura [name] MRN: [id number] DOB:1946-05-04, 76 y.o., female Today's Date: 04/27/2022  PCP: Axel Filler    REFERRING PROVIDER: Christiana Fuchs, DO   END OF SESSION:   PT End of Session - 04/27/22 0943     Visit Number 4    Number of Visits 16    Date for PT Re-Evaluation 06/08/22    Authorization Type [employer] medicare and MCD    PT Start Time 0940    PT Stop Time 1030    PT Time Calculation (min) 50 min    Activity Tolerance Patient tolerated treatment well    Behavior During Therapy [facility] for tasks assessed/performed               Past Medical History:  Diagnosis Date   Abnormal finding 01/15/2018   Chronic back pain    "mostly lower; left foot is numb all the time" (11/22/2016)   Chronic kidney disease    ?renal cyst on MRI    GERD (gastroesophageal reflux disease)    High cholesterol    Lymphadenopathy    documented on chest CT scan 12/15/2005   Substance abuse ([place])    tobacco   Viral upper respiratory tract infection with cough 08/11/2020   Viral URI 05/31/2011   Past Surgical History:  Procedure Laterality Date   ANTERIOR CERVICAL DISCECTOMY     C3-4, C4-5 diskectomy, fusion, plating and allograft,    BACK SURGERY     CARPAL TUNNEL WITH CUBITAL TUNNEL Left    LUMBAR DISC SURGERY  X 2-3   POSTERIOR CERVICAL FUSION/FORAMINOTOMY     C3-5 fusion with wirin   POSTERIOR LUMBAR FUSION     SPINE SURGERY     s/p C3-4, C4-5 diskectomy, fusion, plating and allograft, s/p posterior C3-5 fusion with wiring(02/11/2007 by Dr Lorin Mercy) had psudoarthrosis lumbar surgery 2000)   Patient Active Problem List   Diagnosis Date Noted   Tension headache 08/03/2021   Multiple pulmonary nodules 03/04/2020   PND (post-nasal drip) 12/09/2019   Encounter for screening mammogram for malignant neoplasm of breast 10/20/2019   Risk for falls 03/11/2018   Open-angle glaucoma 11/15/2017   Cramping of hands 11/15/2017    Plantar wart 08/06/2017   Mass of right ear canal 07/04/2017   Screening for osteoporosis 04/06/2017   Bronchitis due to tobacco use 11/22/2016   Left hand pain 04/16/2014   Mixed incontinence urge and stress 10/06/2013   Chronic cough 08/04/2013   HTN (hypertension) 08/04/2013   Health care maintenance 12/31/2012   Post laminectomy syndrome 01/30/2012   Lumbago 01/30/2012   GERD (gastroesophageal reflux disease) 01/05/2011   HLD (hyperlipidemia) 05/18/2010   DEPRESSION 11/25/2009   Low back pain with radiation 04/16/2009   Tobacco use 03/12/2009   PERIPHERAL NEUROPATHY, LOWER EXTREMITY, LEFT 03/12/2009   Allergic rhinitis 10/09/2007   CARPAL TUNNEL SYNDROME, LEFT 09/12/2007    REFERRING DIAG: M96.1 (ICD-10-CM) - Post laminectomy syndrome  THERAPY DIAG:  Radiculopathy, lumbar region  Pain in left leg  Muscle weakness (generalized)  Rationale for Evaluation and Treatment Rehabilitation  PERTINENT HISTORY: BY other provider:  Patient presents with history of low back pain. History of C3-5 interbody fusion and posterior fixation about 10 years ago. Since then she has had pain in lower back that travels down leg and previously followed with Dr. Nelva Bush ([facility]) where she was getting injections to spine with some relief every 6 months. Prior to that she was following with an  orthopedist and was prescribed hydrocodone for low back pain. She currently takes tylenol, gabapentin to help with pain. The pain is worst in the morning and she has an aid that helps her with laundry and household chores as she is not longer able to complete these tasks.    See above for more info on history.   PRECAUTIONS: None   SUBJECTIVE:                                                                                                                                                                                      SUBJECTIVE STATEMENT: Pain about 5/10.  I had to drag myself here today I'm tired.  Did some  extra walking as I helped a cousin move.   PAIN:  Are you having pain? Yes: NPRS scale: 5/10 Pain location: back and LLE  Pain description: sore and stiff Aggravating factors: standing, walking  Relieving factors: sitting, repositioning, laying down    OBJECTIVE: (objective measures completed at initial evaluation unless otherwise dated)  DIAGNOSTIC FINDINGS:  None recent, pt states she may have a referral for MRI    PATIENT SURVEYS:  FOTO 36%   SCREENING FOR RED FLAGS: Bowel or bladder incontinence: No Spinal tumors: No Cauda equina syndrome: No Compression fracture: No Abdominal aneurysm: No   COGNITION: Overall cognitive status: Within functional limits for tasks assessed                          SENSATION: WFL   MUSCLE LENGTH: Hamstrings: tight bilaterally  Thomas test: NT but appears tight    POSTURE: forward head, increased thoracic kyphosis, flexed trunk , and forward head, elevate shoulders     PALPATION: TTP lumbar L side > Rt.  TTP lateral post. Hip, thigh   LUMBAR ROM:    AROM eval  Flexion WFL  + Gower's sign  Extension Lacks 10-15 deg from neutral   Right lateral flexion    Left lateral flexion    Right rotation 50% limited , pain  Left rotation 50% limited , pain   (Blank rows = not tested)   LOWER EXTREMITY ROM:      Passive  Right eval Left eval  Hip flexion WNL WNL   Hip extension limited limited  Hip abduction      Hip adduction      Hip internal rotation WFL tight  Hip external rotation WFL  Tight   Knee flexion      Knee extension Tight  tight  Ankle dorsiflexion Limited  Limited   Ankle plantarflexion      Ankle inversion  Ankle eversion       (Blank rows = not tested)   LOWER EXTREMITY MMT:     MMT Right eval Left eval  Hip flexion 4+ 3+  Hip extension      Hip abduction 4 3  Hip adduction      Hip internal rotation      Hip external rotation      Knee flexion 5 4-  Knee extension 5 4-  Ankle dorsiflexion 5  4  Ankle plantarflexion      Ankle inversion      Ankle eversion       (Blank rows = not tested)   LUMBAR SPECIAL TESTS:  Straight leg raise test: Negative   FUNCTIONAL TESTS:  NT on eval due to time constraints  04/17/22: 5 x STS 23.8 sec , bariatric chair, with UE 04/17/22: 2 MWT: 368 feet  with SPC   GAIT: Distance walked: 100 Assistive device utilized: Single point cane Level of assistance: Modified independence Comments: flexed forward, min antalgic gait LLE   [facility] Adult PT Treatment:                                                DATE: 04/27/22 Therapeutic Exercise: Nustep L5 UE and LE 5 min  Hamstring stretch , ITB 30 sec x 2 , LLE more painful  Windshield wiper x 10  Bridging x 10 with legs extended on bolster, L hamstring cramping with standard hooklying , then x 8 heels pressing into bolster Hooklying green band x 15 clam bilateral, then x 10 unilateral  Sidelying clam GTB x 15  LLE off table: quadratus lumborum stretch in sidelying  Manual therapy: Compression to L piriformis, glute med and Lumbar L paraspinals   10 min MHP to lumbar in supine          [facility] Adult PT Treatment:                                                DATE: 04/25/22 Therapeutic Exercise: NuStep Level 6 UE and LE for 6 min  LAQ , 4 lbs x 20  Bridge 10 x 1 , 3 sec SKTC, 3 each, 30 sec Bridge 10 x 1, 3 sec Hamstring stretch with strap x 3 each, 30 sec LTR 10 sec x 4 each Standing hip extension leaning on high mat table x10 each side Anterior hip stretch in standing cues to posteriorly rotate pelvis Modalities: MHP 10 min supine legs elevated.  Self Care: Heat versus ice [facility] Adult PT Treatment:                                                DATE: 04/17/22 Therapeutic Exercise: Nustep L3 UE/LE x 5 minutes  Bridge 10 x 1 , 3 sec SKTC, 3 each, 30 sec Bridge 10 x 1, 3 sec Hamstring stretch with strap x 3 each, 30 sec LTR 10 sec x 4 each Supne March with ab brace x each Hooklying  clam with red band x 15    Therapeutic Activity: 5 x STS: 23.8 with UE  2 MWT: 368 feet     TODAY'S TREATMENT:                                                                                                 DATE: 04/13/22  PT eval. HEP given and performed 1-5 reps each, POC discussed.  Limited due to late arrival    PATIENT EDUCATION:  Education details: HEP, POC  Person educated: Patient Education method: Explanation, Demonstration, and Handouts Education comprehension: verbalized understanding, returned demonstration, and needs further education   HOME EXERCISE PROGRAM: Access Code: [email or web address] URL: [email or web address] Date: 04/13/2022 Prepared by: Raeford Razor   Exercises - Supine Lower Trunk Rotation  - 2 x daily - 7 x weekly - 2 sets - 10 reps - 10 hold - Supine Single Knee to Chest Stretch  - 2 x daily - 7 x weekly - 1 sets - 3-5 reps - 30 hold - Supine Hamstring Stretch with Strap  - 2 x daily - 7 x weekly - 1 sets - 3-5 reps - 30 hold - Supine Bridge  - 2 x daily - 7 x weekly - 2 sets - 10 reps - 5 hold   ASSESSMENT:   CLINICAL IMPRESSION: Patient with soreness in bilateral lumbar and L post. hip. LLE cramps with hamstring activation in bridge. Manual therapy to reduce pain and tension along L side. Good effort for exercises given today. Cont POC.   OBJECTIVE IMPAIRMENTS: Abnormal gait, decreased activity tolerance, decreased balance, decreased endurance, decreased knowledge of use of DME, decreased mobility, difficulty walking, decreased ROM, decreased strength, hypomobility, increased fascial restrictions, impaired flexibility, postural dysfunction, and pain.    ACTIVITY LIMITATIONS: carrying, lifting, bending, sitting, standing, squatting, stairs, transfers, bed mobility, and locomotion level   PARTICIPATION LIMITATIONS: meal prep, cleaning, interpersonal relationship, shopping, and community activity   PERSONAL FACTORS: Past/current experiences and 3+  comorbidities: chronic back pain/surgery, cervical fusion, neuropathy  are also affecting patient's functional outcome.    REHAB POTENTIAL: Excellent   CLINICAL DECISION MAKING: Evolving/moderate complexity   EVALUATION COMPLEXITY: Moderate     GOALS: Goals reviewed with patient? Yes   SHORT TERM GOALS: Target date: 05/11/2022   Pt will be I with initial HEP for trunk/hips and core Baseline: Goal status: INITIAL   2.  Pt will be screened for balance and functional testing, goals set (2 min walk, 5 x STS) Baseline: not captured Status: captured 04/17/22- see functional testing  Goal status: MET   3.  Pt will report improvement in back and leg pain (20-25%) with ADLs.  Baseline:  Goal status: INITIAL     LONG TERM GOALS: Target date: 06/08/2022   Pt will be I with final HEP upon discharge for longer lasting mobility and health  Baseline:  Goal status: INITIAL   2.  FOTO score will improve to 46% or better to demo functional improvement  Baseline:  Goal status: INITIAL   3.  Pt will be able to stand for light housework, meal prep 15-20 min with pain no more than 4/10 in back and LLE .  Baseline:  Goal status: INITIAL  4.  Pt will improve LLE strength to 5/5 in hip, knee for optimal transfers, gait and function.   Baseline:  Goal status: INITIAL   5.  Pt will improve trunk extension in standing with min cues and pain minimal.  Baseline:  Goal status: INITIAL  6. Patient will be able to walk to her mailbox with no more than min pain in low back Baseline: severe pain Goal status NEW PLAN:   PT FREQUENCY: 2x/week   PT DURATION: 8 weeks   PLANNED INTERVENTIONS: Therapeutic exercises, Therapeutic activity, Neuromuscular re-education, Balance training, Gait training, Patient/Family education, Self Care, Joint mobilization, Aquatic Therapy, Dry Needling, Electrical stimulation, Spinal mobilization, Cryotherapy, Moist heat, Manual therapy, and Re-evaluation.   PLAN  FOR NEXT SESSION: walking, standing as tolerated.  nustep, work towards goals,  LLE strength   Raeford Razor, PT 04/27/22 10:25 AM Phone: [phone] Fax: [phone]

## 2022-05-01 NOTE — Therapy (Deleted)
OUTPATIENT PHYSICAL THERAPY TREATMENT NOTE   Patient Name: Laura [name] MRN: [id number] DOB:01/23/1946, 76 y.o., female Today's Date: 05/01/2022  PCP: Axel Filler    REFERRING PROVIDER: Christiana Fuchs, DO   END OF SESSION:       Past Medical History:  Diagnosis Date   Abnormal finding 01/15/2018   Chronic back pain    "mostly lower; left foot is numb all the time" (11/22/2016)   Chronic kidney disease    ?renal cyst on MRI    GERD (gastroesophageal reflux disease)    High cholesterol    Lymphadenopathy    documented on chest CT scan 12/15/2005   Substance abuse ([place])    tobacco   Viral upper respiratory tract infection with cough 08/11/2020   Viral URI 05/31/2011   Past Surgical History:  Procedure Laterality Date   ANTERIOR CERVICAL DISCECTOMY     C3-4, C4-5 diskectomy, fusion, plating and allograft,    BACK SURGERY     CARPAL TUNNEL WITH CUBITAL TUNNEL Left    LUMBAR DISC SURGERY  X 2-3   POSTERIOR CERVICAL FUSION/FORAMINOTOMY     C3-5 fusion with wirin   POSTERIOR LUMBAR FUSION     SPINE SURGERY     s/p C3-4, C4-5 diskectomy, fusion, plating and allograft, s/p posterior C3-5 fusion with wiring(02/11/2007 by Dr Lorin Mercy) had psudoarthrosis lumbar surgery 2000)   Patient Active Problem List   Diagnosis Date Noted   Tension headache 08/03/2021   Multiple pulmonary nodules 03/04/2020   PND (post-nasal drip) 12/09/2019   Encounter for screening mammogram for malignant neoplasm of breast 10/20/2019   Risk for falls 03/11/2018   Open-angle glaucoma 11/15/2017   Cramping of hands 11/15/2017   Plantar wart 08/06/2017   Mass of right ear canal 07/04/2017   Screening for osteoporosis 04/06/2017   Bronchitis due to tobacco use 11/22/2016   Left hand pain 04/16/2014   Mixed incontinence urge and stress 10/06/2013   Chronic cough 08/04/2013   HTN (hypertension) 08/04/2013   Health care maintenance 12/31/2012   Post laminectomy syndrome 01/30/2012   Lumbago  01/30/2012   GERD (gastroesophageal reflux disease) 01/05/2011   HLD (hyperlipidemia) 05/18/2010   DEPRESSION 11/25/2009   Low back pain with radiation 04/16/2009   Tobacco use 03/12/2009   PERIPHERAL NEUROPATHY, LOWER EXTREMITY, LEFT 03/12/2009   Allergic rhinitis 10/09/2007   CARPAL TUNNEL SYNDROME, LEFT 09/12/2007    REFERRING DIAG: M96.1 (ICD-10-CM) - Post laminectomy syndrome  THERAPY DIAG:  No diagnosis found.  Rationale for Evaluation and Treatment Rehabilitation  PERTINENT HISTORY: BY other provider:  Patient presents with history of low back pain. History of C3-5 interbody fusion and posterior fixation about 10 years ago. Since then she has had pain in lower back that travels down leg and previously followed with Dr. Nelva Bush ([facility]) where she was getting injections to spine with some relief every 6 months. Prior to that she was following with an orthopedist and was prescribed hydrocodone for low back pain. She currently takes tylenol, gabapentin to help with pain. The pain is worst in the morning and she has an aid that helps her with laundry and household chores as she is not longer able to complete these tasks.    See above for more info on history.   PRECAUTIONS: None   SUBJECTIVE:  SUBJECTIVE STATEMENT: Pain about 5/10.  I had to drag myself here today I'm tired.  Did some extra walking as I helped a cousin move.   PAIN:  Are you having pain? Yes: NPRS scale: 5/10 Pain location: back and LLE  Pain description: sore and stiff Aggravating factors: standing, walking  Relieving factors: sitting, repositioning, laying down    OBJECTIVE: (objective measures completed at initial evaluation unless otherwise dated)  DIAGNOSTIC FINDINGS:  None recent, pt states she may have a referral for MRI     PATIENT SURVEYS:  FOTO 36%   SCREENING FOR RED FLAGS: Bowel or bladder incontinence: No Spinal tumors: No Cauda equina syndrome: No Compression fracture: No Abdominal aneurysm: No   COGNITION: Overall cognitive status: Within functional limits for tasks assessed                          SENSATION: WFL   MUSCLE LENGTH: Hamstrings: tight bilaterally  Thomas test: NT but appears tight    POSTURE: forward head, increased thoracic kyphosis, flexed trunk , and forward head, elevate shoulders     PALPATION: TTP lumbar L side > Rt.  TTP lateral post. Hip, thigh   LUMBAR ROM:    AROM eval  Flexion WFL  + Gower's sign  Extension Lacks 10-15 deg from neutral   Right lateral flexion    Left lateral flexion    Right rotation 50% limited , pain  Left rotation 50% limited , pain   (Blank rows = not tested)   LOWER EXTREMITY ROM:      Passive  Right eval Left eval  Hip flexion WNL WNL   Hip extension limited limited  Hip abduction      Hip adduction      Hip internal rotation WFL tight  Hip external rotation WFL  Tight   Knee flexion      Knee extension Tight  tight  Ankle dorsiflexion Limited  Limited   Ankle plantarflexion      Ankle inversion      Ankle eversion       (Blank rows = not tested)   LOWER EXTREMITY MMT:     MMT Right eval Left eval  Hip flexion 4+ 3+  Hip extension      Hip abduction 4 3  Hip adduction      Hip internal rotation      Hip external rotation      Knee flexion 5 4-  Knee extension 5 4-  Ankle dorsiflexion 5 4  Ankle plantarflexion      Ankle inversion      Ankle eversion       (Blank rows = not tested)   LUMBAR SPECIAL TESTS:  Straight leg raise test: Negative   FUNCTIONAL TESTS:  NT on eval due to time constraints  04/17/22: 5 x STS 23.8 sec , bariatric chair, with UE 04/17/22: 2 MWT: 368 feet  with SPC   GAIT: Distance walked: 100 Assistive device utilized: Single point cane Level of assistance: Modified  independence Comments: flexed forward, min antalgic gait LLE   [facility] Adult PT Treatment:                                                DATE: 05/02/22 Therapeutic Exercise: *** Manual Therapy: *** Neuromuscular re-ed: *** Therapeutic Activity: ***  Modalities: *** Self Care: ***  [facility] Adult PT Treatment:                                                DATE: 04/27/22 Therapeutic Exercise: Nustep L5 UE and LE 5 min  Hamstring stretch , ITB 30 sec x 2 , LLE more painful  Windshield wiper x 10  Bridging x 10 with legs extended on bolster, L hamstring cramping with standard hooklying , then x 8 heels pressing into bolster Hooklying green band x 15 clam bilateral, then x 10 unilateral  Sidelying clam GTB x 15  LLE off table: quadratus lumborum stretch in sidelying  Manual therapy: Compression to L piriformis, glute med and Lumbar L paraspinals   10 min MHP to lumbar in supine          [facility] Adult PT Treatment:                                                DATE: 04/25/22 Therapeutic Exercise: NuStep Level 6 UE and LE for 6 min  LAQ , 4 lbs x 20  Bridge 10 x 1 , 3 sec SKTC, 3 each, 30 sec Bridge 10 x 1, 3 sec Hamstring stretch with strap x 3 each, 30 sec LTR 10 sec x 4 each Standing hip extension leaning on high mat table x10 each side Anterior hip stretch in standing cues to posteriorly rotate pelvis Modalities: MHP 10 min supine legs elevated.  Self Care: Heat versus ice [facility] Adult PT Treatment:                                                DATE: 04/17/22 Therapeutic Exercise: Nustep L3 UE/LE x 5 minutes  Bridge 10 x 1 , 3 sec SKTC, 3 each, 30 sec Bridge 10 x 1, 3 sec Hamstring stretch with strap x 3 each, 30 sec LTR 10 sec x 4 each Supne March with ab brace x each Hooklying clam with red band x 15    Therapeutic Activity: 5 x STS: 23.8 with UE 2 MWT: 368 feet     TODAY'S TREATMENT:                                                                                                  DATE: 04/13/22  PT eval. HEP given and performed 1-5 reps each, POC discussed.  Limited due to late arrival    PATIENT EDUCATION:  Education details: HEP, POC  Person educated: Patient Education method: Explanation, Demonstration, and Handouts Education comprehension: verbalized understanding, returned demonstration, and needs further education   HOME EXERCISE PROGRAM: Access Code: [email or web address] URL: [email or web address] Date: 04/13/2022 Prepared by: Raeford Razor  Exercises - Supine Lower Trunk Rotation  - 2 x daily - 7 x weekly - 2 sets - 10 reps - 10 hold - Supine Single Knee to Chest Stretch  - 2 x daily - 7 x weekly - 1 sets - 3-5 reps - 30 hold - Supine Hamstring Stretch with Strap  - 2 x daily - 7 x weekly - 1 sets - 3-5 reps - 30 hold - Supine Bridge  - 2 x daily - 7 x weekly - 2 sets - 10 reps - 5 hold   ASSESSMENT:   CLINICAL IMPRESSION: Patient with soreness in bilateral lumbar and L post. hip. LLE cramps with hamstring activation in bridge. Manual therapy to reduce pain and tension along L side. Good effort for exercises given today. Cont POC.   OBJECTIVE IMPAIRMENTS: Abnormal gait, decreased activity tolerance, decreased balance, decreased endurance, decreased knowledge of use of DME, decreased mobility, difficulty walking, decreased ROM, decreased strength, hypomobility, increased fascial restrictions, impaired flexibility, postural dysfunction, and pain.    ACTIVITY LIMITATIONS: carrying, lifting, bending, sitting, standing, squatting, stairs, transfers, bed mobility, and locomotion level   PARTICIPATION LIMITATIONS: meal prep, cleaning, interpersonal relationship, shopping, and community activity   PERSONAL FACTORS: Past/current experiences and 3+ comorbidities: chronic back pain/surgery, cervical fusion, neuropathy  are also affecting patient's functional outcome.    REHAB POTENTIAL: Excellent   CLINICAL DECISION MAKING:  Evolving/moderate complexity   EVALUATION COMPLEXITY: Moderate     GOALS: Goals reviewed with patient? Yes   SHORT TERM GOALS: Target date: 05/11/2022   Pt will be I with initial HEP for trunk/hips and core Baseline: Goal status: INITIAL   2.  Pt will be screened for balance and functional testing, goals set (2 min walk, 5 x STS) Baseline: not captured Status: captured 04/17/22- see functional testing  Goal status: MET   3.  Pt will report improvement in back and leg pain (20-25%) with ADLs.  Baseline:  Goal status: INITIAL     LONG TERM GOALS: Target date: 06/08/2022   Pt will be I with final HEP upon discharge for longer lasting mobility and health  Baseline:  Goal status: INITIAL   2.  FOTO score will improve to 46% or better to demo functional improvement  Baseline:  Goal status: INITIAL   3.  Pt will be able to stand for light housework, meal prep 15-20 min with pain no more than 4/10 in back and LLE .  Baseline:  Goal status: INITIAL   4.  Pt will improve LLE strength to 5/5 in hip, knee for optimal transfers, gait and function.   Baseline:  Goal status: INITIAL   5.  Pt will improve trunk extension in standing with min cues and pain minimal.  Baseline:  Goal status: INITIAL  6. Patient will be able to walk to her mailbox with no more than min pain in low back Baseline: severe pain Goal status NEW PLAN:   PT FREQUENCY: 2x/week   PT DURATION: 8 weeks   PLANNED INTERVENTIONS: Therapeutic exercises, Therapeutic activity, Neuromuscular re-education, Balance training, Gait training, Patient/Family education, Self Care, Joint mobilization, Aquatic Therapy, Dry Needling, Electrical stimulation, Spinal mobilization, Cryotherapy, Moist heat, Manual therapy, and Re-evaluation.   PLAN FOR NEXT SESSION: walking, standing as tolerated.  nustep, work towards goals,  LLE strength   Raeford Razor, PT 05/01/22 2:52 PM Phone: [phone] Fax: [phone]

## 2022-05-02 ENCOUNTER — Ambulatory Visit: Payer: Medicare Other | Admitting: Physical Therapy

## 2022-05-03 NOTE — Therapy (Unsigned)
OUTPATIENT PHYSICAL THERAPY TREATMENT NOTE   Patient Name: Laura [name] MRN: [id number] DOB:12/27/1945, 76 y.o., female Today's Date: 05/04/2022  PCP: Axel Filler    REFERRING PROVIDER: Christiana Fuchs, DO   END OF SESSION:   PT End of Session - 05/04/22 1021     Visit Number 5    Number of Visits 16    Date for PT Re-Evaluation 06/08/22    Authorization Type [employer] medicare and MCD    PT Start Time 1018    PT Stop Time 1107    PT Time Calculation (min) 49 min    Activity Tolerance Patient tolerated treatment well    Behavior During Therapy WFL for tasks assessed/performed                Past Medical History:  Diagnosis Date   Abnormal finding 01/15/2018   Chronic back pain    "mostly lower; left foot is numb all the time" (11/22/2016)   Chronic kidney disease    ?renal cyst on MRI    GERD (gastroesophageal reflux disease)    High cholesterol    Lymphadenopathy    documented on chest CT scan 12/15/2005   Substance abuse ([place])    tobacco   Viral upper respiratory tract infection with cough 08/11/2020   Viral URI 05/31/2011   Past Surgical History:  Procedure Laterality Date   ANTERIOR CERVICAL DISCECTOMY     C3-4, C4-5 diskectomy, fusion, plating and allograft,    BACK SURGERY     CARPAL TUNNEL WITH CUBITAL TUNNEL Left    LUMBAR DISC SURGERY  X 2-3   POSTERIOR CERVICAL FUSION/FORAMINOTOMY     C3-5 fusion with wirin   POSTERIOR LUMBAR FUSION     SPINE SURGERY     s/p C3-4, C4-5 diskectomy, fusion, plating and allograft, s/p posterior C3-5 fusion with wiring(02/11/2007 by Dr Lorin Mercy) had psudoarthrosis lumbar surgery 2000)   Patient Active Problem List   Diagnosis Date Noted   Tension headache 08/03/2021   Multiple pulmonary nodules 03/04/2020   PND (post-nasal drip) 12/09/2019   Encounter for screening mammogram for malignant neoplasm of breast 10/20/2019   Risk for falls 03/11/2018   Open-angle glaucoma 11/15/2017   Cramping of hands 11/15/2017    Plantar wart 08/06/2017   Mass of right ear canal 07/04/2017   Screening for osteoporosis 04/06/2017   Bronchitis due to tobacco use 11/22/2016   Left hand pain 04/16/2014   Mixed incontinence urge and stress 10/06/2013   Chronic cough 08/04/2013   HTN (hypertension) 08/04/2013   Health care maintenance 12/31/2012   Post laminectomy syndrome 01/30/2012   Lumbago 01/30/2012   GERD (gastroesophageal reflux disease) 01/05/2011   HLD (hyperlipidemia) 05/18/2010   DEPRESSION 11/25/2009   Low back pain with radiation 04/16/2009   Tobacco use 03/12/2009   PERIPHERAL NEUROPATHY, LOWER EXTREMITY, LEFT 03/12/2009   Allergic rhinitis 10/09/2007   CARPAL TUNNEL SYNDROME, LEFT 09/12/2007    REFERRING DIAG: M96.1 (ICD-10-CM) - Post laminectomy syndrome  THERAPY DIAG:  Radiculopathy, lumbar region  Pain in left leg  Muscle weakness (generalized)  Rationale for Evaluation and Treatment Rehabilitation  PERTINENT HISTORY: BY other provider:  Patient presents with history of low back pain. History of C3-5 interbody fusion and posterior fixation about 10 years ago. Since then she has had pain in lower back that travels down leg and previously followed with Dr. Nelva Bush ([facility]) where she was getting injections to spine with some relief every 6 months. Prior to that she was following with  an orthopedist and was prescribed hydrocodone for low back pain. She currently takes tylenol, gabapentin to help with pain. The pain is worst in the morning and she has an aid that helps her with laundry and household chores as she is not longer able to complete these tasks.    See above for more info on history.   PRECAUTIONS: None   SUBJECTIVE:                                                                                                                                                                                      SUBJECTIVE STATEMENT: Pain is 7/10. I am tired today, had MD appt early today.    PAIN:   Are you having pain? Yes: NPRS scale: 7/10 Pain location: back and LLE  Pain description: sore and stiff Aggravating factors: standing, walking  Relieving factors: sitting, repositioning, laying down    OBJECTIVE: (objective measures completed at initial evaluation unless otherwise dated)  DIAGNOSTIC FINDINGS:  None recent, pt states she may have a referral for MRI    PATIENT SURVEYS:  FOTO 36%   SCREENING FOR RED FLAGS: Bowel or bladder incontinence: No Spinal tumors: No Cauda equina syndrome: No Compression fracture: No Abdominal aneurysm: No   COGNITION: Overall cognitive status: Within functional limits for tasks assessed                          SENSATION: WFL   MUSCLE LENGTH: Hamstrings: tight bilaterally  Thomas test: NT but appears tight    POSTURE: forward head, increased thoracic kyphosis, flexed trunk , and forward head, elevate shoulders     PALPATION: TTP lumbar L side > Rt.  TTP lateral post. Hip, thigh   LUMBAR ROM:    AROM eval  Flexion WFL  + Gower's sign  Extension Lacks 10-15 deg from neutral   Right lateral flexion    Left lateral flexion    Right rotation 50% limited , pain  Left rotation 50% limited , pain   (Blank rows = not tested)   LOWER EXTREMITY ROM:      Passive  Right eval Left eval  Hip flexion WNL WNL   Hip extension limited limited  Hip abduction      Hip adduction      Hip internal rotation WFL tight  Hip external rotation WFL  Tight   Knee flexion      Knee extension Tight  tight  Ankle dorsiflexion Limited  Limited   Ankle plantarflexion      Ankle inversion      Ankle eversion       (Blank  rows = not tested)   LOWER EXTREMITY MMT:     MMT Right eval Left eval  Hip flexion 4+ 3+  Hip extension      Hip abduction 4 3  Hip adduction      Hip internal rotation      Hip external rotation      Knee flexion 5 4-  Knee extension 5 4-  Ankle dorsiflexion 5 4  Ankle plantarflexion      Ankle inversion       Ankle eversion       (Blank rows = not tested)   LUMBAR SPECIAL TESTS:  Straight leg raise test: Negative   FUNCTIONAL TESTS:  NT on eval due to time constraints  04/17/22: 5 x STS 23.8 sec , bariatric chair, with UE 04/17/22: 2 MWT: 368 feet  with SPC   GAIT: Distance walked: 100 Assistive device utilized: Single point cane Level of assistance: Modified independence Comments: flexed forward, min antalgic gait LLE   [facility] Adult PT Treatment:                                                DATE: 05/04/22 Therapeutic Exercise: Supine hip stretches, post hip, piriformis  SLR LLE x 10  Post pelvic tilt to bridge  x 10 Banded clam supine green band x 20 Partial range bridge with banded clam x 10  Seated ball A/P tilt x 10, mod cues  Horizontal abd green TB  x 10  Sit to stand pain in Rt knee with airex for hgt LAQ 4lbs x 20  LTR with MHP Manual Therapy: LAD x 3 each LE  Modalities: MHP 12 min   [facility] Adult PT Treatment:                                                DATE: 04/27/22 Therapeutic Exercise: Nustep L5 UE and LE 5 min  Hamstring stretch , ITB 30 sec x 2 , LLE more painful  Windshield wiper x 10  Bridging x 10 with legs extended on bolster, L hamstring cramping with standard hooklying , then x 8 heels pressing into bolster Hooklying green band x 15 clam bilateral, then x 10 unilateral  Sidelying clam GTB x 15  LLE off table: quadratus lumborum stretch in sidelying  Manual therapy: Compression to L piriformis, glute med and Lumbar L paraspinals   10 min MHP to lumbar in supine          [facility] Adult PT Treatment:                                                DATE: 04/25/22 Therapeutic Exercise: NuStep Level 6 UE and LE for 6 min  LAQ , 4 lbs x 20  Bridge 10 x 1 , 3 sec SKTC, 3 each, 30 sec Bridge 10 x 1, 3 sec Hamstring stretch with strap x 3 each, 30 sec LTR 10 sec x 4 each Standing hip extension leaning on high mat table x10 each side Anterior hip stretch  in standing cues to posteriorly rotate pelvis Modalities:  MHP 10 min supine legs elevated.  Self Care: Heat versus ice [facility] Adult PT Treatment:                                                DATE: 04/17/22 Therapeutic Exercise: Nustep L3 UE/LE x 5 minutes  Bridge 10 x 1 , 3 sec SKTC, 3 each, 30 sec Bridge 10 x 1, 3 sec Hamstring stretch with strap x 3 each, 30 sec LTR 10 sec x 4 each Supne March with ab brace x each Hooklying clam with red band x 15    Therapeutic Activity: 5 x STS: 23.8 with UE 2 MWT: 368 feet     TODAY'S TREATMENT:                                                                                                 DATE: 04/13/22  PT eval. HEP given and performed 1-5 reps each, POC discussed.  Limited due to late arrival    PATIENT EDUCATION:  Education details: HEP, POC  Person educated: Patient Education method: Explanation, Demonstration, and Handouts Education comprehension: verbalized understanding, returned demonstration, and needs further education   HOME EXERCISE PROGRAM: Access Code: [email or web address] URL: [email or web address] Date: 04/13/2022 Prepared by: Raeford Razor   Exercises - Supine Lower Trunk Rotation  - 2 x daily - 7 x weekly - 2 sets - 10 reps - 10 hold - Supine Single Knee to Chest Stretch  - 2 x daily - 7 x weekly - 1 sets - 3-5 reps - 30 hold - Supine Hamstring Stretch with Strap  - 2 x daily - 7 x weekly - 1 sets - 3-5 reps - 30 hold - Supine Bridge  - 2 x daily - 7 x weekly - 2 sets - 10 reps - 5 hold   ASSESSMENT:   CLINICAL IMPRESSION: Patient with increased fatigue and pain today had difficulty with standing exercises.  Seated trunk range of motion was done as an alternative.  Sit to stand exercises aggravate right knee pain.  She expresses concern that her legs feel so weak at times like they are just going to give out.  No further goals met at this point we will continue to challenge her ability to stand within the  limits of pain and tolerate activity.  OBJECTIVE IMPAIRMENTS: Abnormal gait, decreased activity tolerance, decreased balance, decreased endurance, decreased knowledge of use of DME, decreased mobility, difficulty walking, decreased ROM, decreased strength, hypomobility, increased fascial restrictions, impaired flexibility, postural dysfunction, and pain.    ACTIVITY LIMITATIONS: carrying, lifting, bending, sitting, standing, squatting, stairs, transfers, bed mobility, and locomotion level   PARTICIPATION LIMITATIONS: meal prep, cleaning, interpersonal relationship, shopping, and community activity   PERSONAL FACTORS: Past/current experiences and 3+ comorbidities: chronic back pain/surgery, cervical fusion, neuropathy  are also affecting patient's functional outcome.    REHAB POTENTIAL: Excellent   CLINICAL DECISION MAKING: Evolving/moderate complexity   EVALUATION COMPLEXITY: Moderate     GOALS: Goals reviewed with  patient? Yes   SHORT TERM GOALS: Target date: 05/11/2022   Pt will be I with initial HEP for trunk/hips and core Baseline: Goal status: INITIAL   2.  Pt will be screened for balance and functional testing, goals set (2 min walk, 5 x STS) Baseline: not captured Status: captured 04/17/22- see functional testing  Goal status: MET   3.  Pt will report improvement in back and leg pain (20-25%) with ADLs.  Baseline:  Goal status: ongoing      LONG TERM GOALS: Target date: 06/08/2022   Pt will be I with final HEP upon discharge for longer lasting mobility and health  Baseline:  Goal status: INITIAL   2.  FOTO score will improve to 46% or better to demo functional improvement  Baseline:  Goal status: INITIAL   3.  Pt will be able to stand for light housework, meal prep 15-20 min with pain no more than 4/10 in back and LLE .  Baseline:  Goal status: INITIAL   4.  Pt will improve LLE strength to 5/5 in hip, knee for optimal transfers, gait and function.   Baseline:   Goal status: INITIAL   5.  Pt will improve trunk extension in standing with min cues and pain minimal.  Baseline:  Goal status: INITIAL  6. Patient will be able to walk to her mailbox with no more than min pain in low back Baseline: severe pain Goal status NEW PLAN:   PT FREQUENCY: 2x/week   PT DURATION: 8 weeks   PLANNED INTERVENTIONS: Therapeutic exercises, Therapeutic activity, Neuromuscular re-education, Balance training, Gait training, Patient/Family education, Self Care, Joint mobilization, Aquatic Therapy, Dry Needling, Electrical stimulation, Spinal mobilization, Cryotherapy, Moist heat, Manual therapy, and Re-evaluation.   PLAN FOR NEXT SESSION: walking, standing as tolerated.  nustep, work towards goals,  LLE strength   Raeford Razor, PT 05/04/22 11:03 AM Phone: [phone] Fax: [phone]

## 2022-05-04 ENCOUNTER — Encounter: Payer: Self-pay | Admitting: Physical Therapy

## 2022-05-04 ENCOUNTER — Ambulatory Visit: Payer: Medicare Other | Admitting: Physical Therapy

## 2022-05-04 ENCOUNTER — Encounter: Payer: Self-pay | Admitting: Internal Medicine

## 2022-05-04 ENCOUNTER — Ambulatory Visit (INDEPENDENT_AMBULATORY_CARE_PROVIDER_SITE_OTHER): Payer: Medicare Other

## 2022-05-04 ENCOUNTER — Ambulatory Visit (INDEPENDENT_AMBULATORY_CARE_PROVIDER_SITE_OTHER): Payer: Medicare Other | Admitting: Internal Medicine

## 2022-05-04 VITALS — BP 129/89 | HR 92 | Temp 98.0°F | Ht 67.0 in | Wt 206.1 lb

## 2022-05-04 DIAGNOSIS — Z87891 Personal history of nicotine dependence: Secondary | ICD-10-CM

## 2022-05-04 DIAGNOSIS — R7303 Prediabetes: Secondary | ICD-10-CM | POA: Diagnosis not present

## 2022-05-04 DIAGNOSIS — Z23 Encounter for immunization: Secondary | ICD-10-CM

## 2022-05-04 DIAGNOSIS — M5416 Radiculopathy, lumbar region: Secondary | ICD-10-CM

## 2022-05-04 DIAGNOSIS — E7800 Pure hypercholesterolemia, unspecified: Secondary | ICD-10-CM | POA: Diagnosis not present

## 2022-05-04 DIAGNOSIS — Z0000 Encounter for general adult medical examination without abnormal findings: Secondary | ICD-10-CM

## 2022-05-04 DIAGNOSIS — Z1231 Encounter for screening mammogram for malignant neoplasm of breast: Secondary | ICD-10-CM

## 2022-05-04 DIAGNOSIS — M79605 Pain in left leg: Secondary | ICD-10-CM

## 2022-05-04 DIAGNOSIS — M6281 Muscle weakness (generalized): Secondary | ICD-10-CM

## 2022-05-04 DIAGNOSIS — J849 Interstitial pulmonary disease, unspecified: Secondary | ICD-10-CM

## 2022-05-04 DIAGNOSIS — M545 Low back pain, unspecified: Secondary | ICD-10-CM | POA: Diagnosis not present

## 2022-05-04 DIAGNOSIS — Z720 Tobacco use: Secondary | ICD-10-CM

## 2022-05-04 DIAGNOSIS — I10 Essential (primary) hypertension: Secondary | ICD-10-CM | POA: Diagnosis not present

## 2022-05-04 NOTE — Progress Notes (Addendum)
Subjective:  CC: follow-up on back pain  HPI:  [name] is a 76 y.o. female with a past medical history stated below and presents today for follow-up on back pain and chronic medical conditions. She was seen in August for back pain. She has a history of laminectomy and previously follow-up with pain management. She was referred back  PM&R and they do not have additional procedures to offer. She was given course of opioids and sent to PT. She has been going to PT and reports that her pain is tolerable with PT and now tylenol. She has not had opioids in several weeks but is managing without. Please see problem based assessment and plan for additional details.  Past Medical History:  Diagnosis Date   Chronic back pain    "mostly lower; left foot is numb all the time" (11/22/2016)   Chronic kidney disease    ?renal cyst on MRI    GERD (gastroesophageal reflux disease)    High cholesterol    Lymphadenopathy    documented on chest CT scan 12/15/2005   Substance abuse (HCC)    tobacco    Current Outpatient Medications on File Prior to Visit  Medication Sig Dispense Refill   albuterol (VENTOLIN HFA) 108 (90 Base) MCG/ACT inhaler INHALE 1-2 PUFFS BY MOUTH EVERY 6 HOURS AS NEEDED FOR WHEEZE OR SHORTNESS OF BREATH 8.5 each 2   amLODipine (NORVASC) 10 MG tablet Take 1 tablet (10 mg total) by mouth daily. 90 tablet 6   aspirin 81 MG chewable tablet Chew 81 mg by mouth daily.     fluticasone (FLONASE) 50 MCG/ACT nasal spray USE 1-2 SPRAYS IN EACH NOSTRIL DAILY 48 mL 3   gabapentin (NEURONTIN) 600 MG tablet Take 1 tablet (600 mg total) by mouth 4 (four) times daily. 120 tablet 2   metoprolol succinate (TOPROL-XL) 25 MG 24 hr tablet Take 25 mg by mouth daily.     mirabegron ER (MYRBETRIQ) 25 MG TB24 tablet Take 1 tablet (25 mg total) by mouth daily. (Patient not taking: Reported on 04/13/2022) 90 tablet 3   pantoprazole (PROTONIX) 40 MG tablet TAKE 1 TABLET BY MOUTH EVERY DAY 90 tablet 2    rosuvastatin (CRESTOR) 10 MG tablet Take 1 tablet (10 mg total) by mouth daily. 90 tablet 3   Sodium Chloride-Sodium Bicarb (NETI POT SINUS WASH) 2300-700 MG KIT Place 1 kit into the nose daily. 1 kit 0   No current facility-administered medications on file prior to visit.    Family History  Problem Relation Age of Onset   Diabetes Father    Hypertension Father     Social History   Socioeconomic History   Marital status: Divorced    Spouse name: Not on file   Number of children: 0   Years of education: Not on file   Highest education level: Not on file  Occupational History    Employer: UNEMPLOYED  Tobacco Use   Smoking status: Former    Packs/day: 0.50    Years: 47.00    Total pack years: 23.50    Types: Cigarettes    Quit date: 05/13/2020    Years since quitting: 2.0   Smokeless tobacco: Never   Tobacco comments:    stopped around Thanksgiving   Vaping Use   Vaping Use: Never used  Substance and Sexual Activity   Alcohol use: Yes    Alcohol/week: 0.0 standard drinks of alcohol    Comment: 11/22/2016 "2 drinks q other holiday"  Drug use: Not Currently    Types: Marijuana    Comment: 66/2018 "I stopped in the 1970s"   Sexual activity: Yes  Other Topics Concern   Not on file  Social History Narrative   Patient's new Phone # [phone] and [phone].   Social Determinants of Health   Financial Resource Strain: Low Risk  (05/04/2022)   Overall Financial Resource Strain (CARDIA)    Difficulty of Paying Living Expenses: Not very hard  Food Insecurity: No Food Insecurity (05/04/2022)   Hunger Vital Sign    Worried About Running Out of Food in the Last Year: Never true    Ran Out of Food in the Last Year: Never true  Transportation Needs: No Transportation Needs (05/04/2022)   PRAPARE - Hydrologist (Medical): No    Lack of Transportation (Non-Medical): No  Physical Activity: Inactive (05/04/2022)   Exercise Vital Sign    Days of  Exercise per Week: 0 days    Minutes of Exercise per Session: 10 min  Stress: No Stress Concern Present (05/04/2022)   [place]    Feeling of Stress : Only a little  Social Connections: Moderately Integrated (05/04/2022)   Social Connection and Isolation Panel [NHANES]    Frequency of Communication with Friends and Family: More than three times a week    Frequency of Social Gatherings with Friends and Family: More than three times a week    Attends Religious Services: More than 4 times per year    Active Member of [employer] or Organizations: Yes    Attends Music therapist: More than 4 times per year    Marital Status: Divorced  Intimate Partner Violence: Not At Risk (05/04/2022)   Humiliation, Afraid, Rape, and Kick questionnaire    Fear of Current or Ex-Partner: No    Emotionally Abused: No    Physically Abused: No    Sexually Abused: No    Review of Systems: ROS negative except for what is noted on the assessment and plan.  Objective:   Vitals:   05/04/22 0840  BP: 129/89  Pulse: 92  Temp: 98 F (36.7 C)  TempSrc: Oral  SpO2: 96%  Weight: 206 lb 1.6 oz (93.5 kg)  Height: _0  (1.702 m)    Physical Exam: Constitutional: well-appearing  Cardiovascular: regular rate and rhythm, no m/r/g Pulmonary/Chest: normal work of breathing on room air, lungs clear to auscultation bilaterally Abdominal: soft, non-tender, non-distended MSK: normal bulk and tone Skin: warm and dry   Assessment & Plan:  Pre-diabetes A1c increased from 5.6 to 5.8 consistent with pre-diabetes. P: Repeat A1c in 5/24  HTN (hypertension) BP well controlled at 129/89. She is adherent with amlodipine 10 mg. P: Continue amlodipine 10 mg  Tobacco use She smokes about 5-6 cigarettes per day for over 40 years. She has noticed that she coughs when she lies on her right side. She does feel short of breath with activities but  is not interested in using an inhaler. We talked about medications to help with craving tobacco, but she is not interested in nicotine gum/patches or chantix. A/P: Continue counseling smoking cessation  Encounter for screening mammogram for malignant neoplasm of breast Screening mammogram ordered  HLD (hyperlipidemia) She is adherent with crestor 10 mg for secondary prevention. Repeat lipid panel with LDL at goal at 66. P: Continue crestor 10 mg  Low back pain with radiation Hx of C3-5 interbody fusion and posterior  fixation about 10 years ago. Previously followed with Dr. Nelva Bush with PM&R. She reports improved pain since going to PT. She is taking tylenol 500 mg BID and her pain is tolerable. P: Continue to monitor  ILD (interstitial lung disease) ([place]) Addendum 05/24/22: CT chest ordered to follow-up on pulmonary nodule.  Pulmonary nodule was stable from 2016 with no further follow-up imaging recommended.  CT showed signs concerning for fibrotic interstitial lung disease with probable UIP pattern.   When I called and reviewed results with patient she stated that she was having increased shortness of breath, worsening cough when she laid on her right side. PFT ordered.   PFT shows moderately severe diffusion defect consistent with pulmonary hypertension.   I called and reviewed results with patient.  She will need follow-up with pulmonology.  Referral made.    Patient discussed with Dr. Jay Schlichter Caitrin Pendergraph, D.O. [facility]  PGY-2 Pager: [phone]  Phone: [phone] Date 05/24/2022  Time 4:43 PM

## 2022-05-04 NOTE — Patient Instructions (Signed)
Thank you, Ms.Nekeisha SHAMONICA SCHADT for allowing [place] to provide your care today.   Back pain I am glad to hear that your back pain is doing better. Please continue gabapentin, tylenol and Physical therapy as you have been.  Cholesterol I am rechecking your blood work today for cholesterol and blood sugar levels.  I have also placed an order for mammogram.  I do think that your cough is related to smoking. If your feel more short of breath, we could consider starting an inhaler if you become interested.  I have ordered a mammogram, this will be the last one you will be recommended to do!   I have ordered the following labs for you:   Lab Orders         Lipid Profile         Hemoglobin A1c       I have ordered the following medication/changed the following medications:   Stop the following medications: There are no discontinued medications.   Start the following medications: No orders of the defined types were placed in this encounter.    Follow up: 6 months   We look forward to seeing you next time. Please call our clinic at [phone] if you have any questions or concerns. The best time to call is Monday-Friday from 9am-4pm, but there is someone available 24/7. If after hours or the weekend, call the main hospital number and ask for the Internal Medicine Resident On-Call. If you need medication refills, please notify your pharmacy one week in advance and they will send [place] a request.   Thank you for trusting me with your care. Wishing you the best!   Christiana Fuchs, [place]

## 2022-05-04 NOTE — Progress Notes (Signed)
Subjective:   Laura [name] is a 76 y.o. female who presents for an Initial Medicare Annual Wellness Visit. I connected with  Laura [name] on 05/04/22 by a  Face-To-Face  enabled telemedicine application and verified that I am speaking with the correct person using two identifiers.  Patient Location: Other:  Office/Clinic  Provider Location: Office/Clinic  I discussed the limitations of evaluation and management by telemedicine. The patient expressed understanding and agreed to proceed.  Review of Systems    Defer to PCP       Objective:    Today's Vitals   05/04/22 1039  BP: 129/89  Pulse: 92  Temp: 98 F (36.7 C)  TempSrc: Oral  SpO2: 96%  Weight: 206 lb 1.6 oz (93.5 kg)  Height: _0  (1.702 m)   Body mass index is 32.28 kg/m.     05/04/2022   10:47 AM 05/04/2022    8:44 AM 04/13/2022    9:48 AM 02/14/2022    9:21 AM 10/11/2021   10:03 AM 08/03/2021    4:19 PM 08/11/2020    1:46 PM  Advanced Directives  Does Patient Have a Medical Advance Directive? _1  No No  Would patient like information on creating a medical advance directive? No - Patient declined No - Patient declined No - Patient declined No - Patient declined No - Patient declined No - Patient declined No - Patient declined    Current Medications (verified) Outpatient Encounter Medications as of 05/04/2022  Medication Sig   albuterol (VENTOLIN HFA) 108 (90 Base) MCG/ACT inhaler INHALE 1-2 PUFFS BY MOUTH EVERY 6 HOURS AS NEEDED FOR WHEEZE OR SHORTNESS OF BREATH   amLODipine (NORVASC) 10 MG tablet Take 1 tablet (10 mg total) by mouth daily.   aspirin 81 MG chewable tablet Chew 81 mg by mouth daily.   fluticasone (FLONASE) 50 MCG/ACT nasal spray USE 1-2 SPRAYS IN EACH NOSTRIL DAILY   gabapentin (NEURONTIN) 600 MG tablet Take 1 tablet (600 mg total) by mouth 4 (four) times daily.   HYDROcodone-acetaminophen (NORCO/VICODIN) 5-325 MG tablet Take 1 tablet by mouth every 6 (six) hours as needed  for moderate pain. (Patient not taking: Reported on 04/13/2022)   metoprolol succinate (TOPROL-XL) 25 MG 24 hr tablet Take 25 mg by mouth daily.   mirabegron ER (MYRBETRIQ) 25 MG TB24 tablet Take 1 tablet (25 mg total) by mouth daily. (Patient not taking: Reported on 04/13/2022)   pantoprazole (PROTONIX) 40 MG tablet TAKE 1 TABLET BY MOUTH EVERY DAY   rosuvastatin (CRESTOR) 10 MG tablet Take 1 tablet (10 mg total) by mouth daily.   Sodium Chloride-Sodium Bicarb (NETI POT SINUS WASH) 2300-700 MG KIT Place 1 kit into the nose daily.   No facility-administered encounter medications on file as of 05/04/2022.    Allergies (verified) Patient has no known allergies.   History: Past Medical History:  Diagnosis Date   Abnormal finding 01/15/2018   Chronic back pain    "mostly lower; left foot is numb all the time" (11/22/2016)   Chronic kidney disease    ?renal cyst on MRI    GERD (gastroesophageal reflux disease)    High cholesterol    Lymphadenopathy    documented on chest CT scan 12/15/2005   Substance abuse ([place])    tobacco   Viral upper respiratory tract infection with cough 08/11/2020   Viral URI 05/31/2011   Past Surgical History:  Procedure Laterality Date   ANTERIOR CERVICAL DISCECTOMY  C3-4, C4-5 diskectomy, fusion, plating and allograft,    BACK SURGERY     CARPAL TUNNEL WITH CUBITAL TUNNEL Left    LUMBAR DISC SURGERY  X 2-3   POSTERIOR CERVICAL FUSION/FORAMINOTOMY     C3-5 fusion with wirin   POSTERIOR LUMBAR FUSION     SPINE SURGERY     s/p C3-4, C4-5 diskectomy, fusion, plating and allograft, s/p posterior C3-5 fusion with wiring(02/11/2007 by Dr Lorin Mercy) had psudoarthrosis lumbar surgery 2000)   Family History  Problem Relation Age of Onset   Diabetes Father    Hypertension Father    Social History   Socioeconomic History   Marital status: Divorced    Spouse name: Not on file   Number of children: 0   Years of education: Not on file   Highest education level:  Not on file  Occupational History    Employer: UNEMPLOYED  Tobacco Use   Smoking status: Former    Packs/day: 0.50    Years: 47.00    Total pack years: 23.50    Types: Cigarettes    Quit date: 05/13/2020    Years since quitting: 1.9   Smokeless tobacco: Never   Tobacco comments:    stopped around Thanksgiving   Vaping Use   Vaping Use: Never used  Substance and Sexual Activity   Alcohol use: Yes    Alcohol/week: 0.0 standard drinks of alcohol    Comment: 11/22/2016 "2 drinks q other holiday"   Drug use: Not Currently    Types: Marijuana    Comment: 66/2018 "I stopped in the 1970s"   Sexual activity: Yes  Other Topics Concern   Not on file  Social History Narrative   Patient's new Phone # [phone] and [phone].   Social Determinants of Health   Financial Resource Strain: Low Risk  (05/04/2022)   Overall Financial Resource Strain (CARDIA)    Difficulty of Paying Living Expenses: Not very hard  Food Insecurity: No Food Insecurity (05/04/2022)   Hunger Vital Sign    Worried About Running Out of Food in the Last Year: Never true    Ran Out of Food in the Last Year: Never true  Transportation Needs: No Transportation Needs (05/04/2022)   PRAPARE - Hydrologist (Medical): No    Lack of Transportation (Non-Medical): No  Physical Activity: Inactive (05/04/2022)   Exercise Vital Sign    Days of Exercise per Week: 0 days    Minutes of Exercise per Session: 10 min  Stress: No Stress Concern Present (05/04/2022)   [place]    Feeling of Stress : Only a little  Social Connections: Moderately Integrated (05/04/2022)   Social Connection and Isolation Panel [NHANES]    Frequency of Communication with Friends and Family: More than three times a week    Frequency of Social Gatherings with Friends and Family: More than three times a week    Attends Religious Services: More than 4 times per  year    Active Member of [employer] or Organizations: Yes    Attends Music therapist: More than 4 times per year    Marital Status: Divorced    Tobacco Counseling Counseling given: Not Answered Tobacco comments: stopped around Thanksgiving    Clinical Intake:  Pre-visit preparation completed: Yes  Pain : No/denies pain     Nutritional Risks: None Diabetes: No  How often do you need to have someone help you when you read instructions,  pamphlets, or other written materials from your doctor or pharmacy?: 1 - Never What is the last grade level you completed in school?: 16 years  Diabetic?No  Interpreter Needed?: No  Information entered by :: Corey Skains Advik Weatherspoon,cma 05/04/22 10:46am   Activities of Daily Living    05/04/2022   10:47 AM 05/04/2022    8:44 AM  In your present state of health, do you have any difficulty performing the following activities:  Hearing? 0 0  Vision? 1 1  Difficulty concentrating or making decisions? 0 0  Walking or climbing stairs? 1 1  Dressing or bathing? 1 1  Doing errands, shopping? 1 1    Patient Care Team: Masters, Joellen Jersey, DO as PCP - General  Indicate any recent Medical Services you may have received from other than [facility] providers in the past year (date may be approximate).     Assessment:   This is a routine wellness examination for [place].  Hearing/Vision screen No results found.  Dietary issues and exercise activities discussed:     Goals Addressed   None   Depression Screen    05/04/2022   10:47 AM 05/04/2022    8:44 AM 02/14/2022    9:21 AM 10/11/2021   10:04 AM 04/05/2021    8:58 AM 08/11/2020    4:15 PM 08/11/2020    1:47 PM  PHQ 2/9 Scores  PHQ - 2 Score 0 0 0 0 0 1 0  PHQ- 9 Score _0 Fall Risk    05/04/2022   10:47 AM 05/04/2022    8:44 AM 02/14/2022    9:20 AM 10/11/2021   10:03 AM 08/03/2021    4:18 PM  Fall Risk   Falls in the past year? 0 0 0 0 0  Number falls in past yr: 0 0 0  0   Injury with Fall? 0 0 0  0  Risk for fall due to : No Fall Risks No Fall Risks Impaired balance/gait No Fall Risks;Impaired balance/gait   Follow up Falls evaluation completed;Falls prevention discussed Falls evaluation completed;Falls prevention discussed Falls evaluation completed;Falls prevention discussed Falls evaluation completed Falls evaluation completed    FALL RISK PREVENTION PERTAINING TO THE HOME:  Any stairs in or around the home? No  If so, are there any without handrails? No  Home free of loose throw rugs in walkways, pet beds, electrical cords, etc? Yes  Adequate lighting in your home to reduce risk of falls? Yes   ASSISTIVE DEVICES UTILIZED TO PREVENT FALLS:  Life alert? Yes  Use of a cane, walker or w/c? Yes  Grab bars in the bathroom? Yes  Shower chair or bench in shower? Yes  Elevated toilet seat or a handicapped toilet? Yes   TIMED UP AND GO:  Was the test performed? Yes .  Length of time to ambulate 10 feet: 1 min   Gait slow and steady with assistive device  Cognitive Function:        05/04/2022   10:47 AM  6CIT Screen  What Year? 0 points  What month? 0 points  What time? 0 points  Count back from 20 0 points  Months in reverse 0 points  Repeat phrase 0 points  Total Score 0 points    Immunizations Immunization History  Administered Date(s) Administered   Fluad Quad(high Dose 65+) 04/05/2021, 05/04/2022   Influenza Whole 05/11/2009   Influenza,inj,Quad PF,6+ Mos 08/04/2013, 03/16/2014, 03/28/2017, 03/11/2018   PFIZER(Purple Top)SARS-COV-2 Vaccination 09/11/2019,  10/06/2019   Pneumococcal Conjugate-13 08/31/2014   Pneumococcal Polysaccharide-23 08/04/2013   Tdap 03/16/2014    TDAP status: Up to date  Flu Vaccine status: Up to date  Pneumococcal vaccine status: Up to date  Covid-19 vaccine status: Completed vaccines  Qualifies for Shingles Vaccine? No   Zostavax completed No   Shingrix Completed?: No.    Education has been  provided regarding the importance of this vaccine. Patient has been advised to call insurance company to determine out of pocket expense if they have not yet received this vaccine. Advised may also receive vaccine at local pharmacy or Health Dept. Verbalized acceptance and understanding.  Screening Tests Health Maintenance  Topic Date Due   Hepatitis C Screening  Never done   Zoster Vaccines- Shingrix (1 of 2) Never done   Lung Cancer Screening  12/09/2015   COVID-19 Vaccine (3 - Pfizer risk series) 11/03/2019   MAMMOGRAM  03/31/2021   COLONOSCOPY (Pts 45-54yr Insurance coverage will need to be confirmed)  08/18/2022 (Originally 08/17/2020)   Medicare Annual Wellness (AWV)  05/05/2023   TETANUS/TDAP  03/16/2024   Pneumonia Vaccine 74 Years old  Completed   INFLUENZA VACCINE  Completed   DEXA SCAN  Completed   HPV VACCINES  Aged Out    Health Maintenance  Health Maintenance Due  Topic Date Due   Hepatitis C Screening  Never done   Zoster Vaccines- Shingrix (1 of 2) Never done   Lung Cancer Screening  12/09/2015   COVID-19 Vaccine (3 - Pfizer risk series) 11/03/2019   MAMMOGRAM  03/31/2021   Colorectal Cancer Screening: Patient Due Mammogram status: Ordered 05/04/2022. Pt provided with contact info and advised to call to schedule appt.   Bone Density status: Completed 06/26/2017. Results reflect: Bone density results: NORMAL. Repeat every 0 years.  Lung Cancer Screening: (Low Dose CT Chest recommended if Age 74-80years, 30 pack-year currently smoking OR have quit w/in 15years.) does not qualify.   Lung Cancer Screening Referral: N/A  Additional Screening:  Hepatitis C Screening: does not qualify; Completed N/A  Vision Screening: Recommended annual ophthalmology exams for early detection of glaucoma and other disorders of the eye. Is the patient up to date with their annual eye exam?  Yes  Who is the provider or what is the name of the office in which the patient attends  annual eye exams? [facility] If pt is not established with a provider, would they like to be referred to a provider to establish care? No .   Dental Screening: Recommended annual dental exams for proper oral hygiene  Community Resource Referral / Chronic Care Management: CRR required this visit?  No   CCM required this visit?  No      Plan:     I have personally reviewed and noted the following in the patient's chart:   Medical and social history Use of alcohol, tobacco or illicit drugs  Current medications and supplements including opioid prescriptions. Patient is currently taking opioid prescriptions. Information provided to patient regarding non-opioid alternatives. Patient advised to discuss non-opioid treatment plan with their provider. Functional ability and status Nutritional status Physical activity Advanced directives List of other physicians Hospitalizations, surgeries, and ER visits in previous 12 months Vitals Screenings to include cognitive, depression, and falls Referrals and appointments  In addition, I have reviewed and discussed with patient certain preventive protocols, quality metrics, and best practice recommendations. A written personalized care plan for preventive services as well as general preventive health recommendations were  provided to patient.     Kerin Perna, [facility]   05/04/2022   Nurse Notes: Face-To-Face visit  [name] , Thank you for taking time to come for your Medicare Wellness Visit. I appreciate your ongoing commitment to your health goals. Please review the following plan we discussed and let me know if I can assist you in the future.   These are the goals we discussed:  Goals      Blood Pressure < 150/90        This is a list of the screening recommended for you and due dates:  Health Maintenance  Topic Date Due   Hepatitis C Screening: USPSTF Recommendation to screen - Ages 39-79 yo.  Never done   Zoster (Shingles)  Vaccine (1 of 2) Never done   Screening for Lung Cancer  12/09/2015   COVID-19 Vaccine (3 - Pfizer risk series) 11/03/2019   Mammogram  03/31/2021   Colon Cancer Screening  08/18/2022*   Medicare Annual Wellness Visit  05/05/2023   Tetanus Vaccine  03/16/2024   Pneumonia Vaccine  Completed   Flu Shot  Completed   DEXA scan (bone density measurement)  Completed   HPV Vaccine  Aged Out  *Topic was postponed. The date shown is not the original due date.

## 2022-05-05 ENCOUNTER — Encounter: Payer: Self-pay | Admitting: Internal Medicine

## 2022-05-05 DIAGNOSIS — R7303 Prediabetes: Secondary | ICD-10-CM | POA: Insufficient documentation

## 2022-05-05 LAB — LIPID PANEL
Chol/HDL Ratio: 3.4 ratio (ref 0.0–4.4)
Cholesterol, Total: 116 mg/dL (ref 100–199)
HDL: 34 mg/dL — ABNORMAL LOW (ref >39)
LDL Chol Calc (NIH): 66 mg/dL (ref 0–99)
Triglycerides: 77 mg/dL (ref 0–149)
VLDL Cholesterol Cal: 16 mg/dL (ref 5–40)

## 2022-05-05 LAB — HEMOGLOBIN A1C
Est. average glucose Bld gHb Est-mCnc: 120 mg/dL
Hgb A1c MFr Bld: 5.8 % — ABNORMAL HIGH (ref 4.8–5.6)

## 2022-05-05 NOTE — Assessment & Plan Note (Addendum)
A1c increased from 5.6 to 5.8 consistent with pre-diabetes. P: Repeat A1c in 5/24

## 2022-05-05 NOTE — Progress Notes (Signed)
Internal Medicine Clinic Attending  Case discussed with Dr. Masters  At the time of the visit.  We reviewed the resident's history and exam and pertinent patient test results.  I agree with the assessment, diagnosis, and plan of care documented in the resident's note.  

## 2022-05-05 NOTE — Assessment & Plan Note (Signed)
BP well controlled at 129/89. She is adherent with amlodipine 10 mg. P: Continue amlodipine 10 mg

## 2022-05-05 NOTE — Assessment & Plan Note (Signed)
She smokes about 5-6 cigarettes per day for over 40 years. She has noticed that she coughs when she lies on her right side. She does feel short of breath with activities but is not interested in using an inhaler. We talked about medications to help with craving tobacco, but she is not interested in nicotine gum/patches or chantix. A/P: Continue counseling smoking cessation

## 2022-05-05 NOTE — Therapy (Addendum)
OUTPATIENT PHYSICAL THERAPY TREATMENT NOTE DISCHARGE   Patient Name: Laura [name] MRN: [id number] DOB:05/10/1946, 76 y.o., female Today's Date: 05/08/2022  PCP: Vincent, Duncan Thomas    REFERRING PROVIDER: Katie Masters, DO   END OF SESSION:   PT End of Session - 05/08/22 1025     Visit Number 6    Number of Visits 16    Date for PT Re-Evaluation 06/08/22    Authorization Type [employer] medicare and MCD    PT Start Time 1022    PT Stop Time 1100    PT Time Calculation (min) 38 min    Activity Tolerance Patient tolerated treatment well    Behavior During Therapy WFL for tasks assessed/performed                 Past Medical History:  Diagnosis Date   Chronic back pain    "mostly lower; left foot is numb all the time" (11/22/2016)   Chronic kidney disease    ?renal cyst on MRI    GERD (gastroesophageal reflux disease)    High cholesterol    Lymphadenopathy    documented on chest CT scan 12/15/2005   Substance abuse (HCC)    tobacco   Past Surgical History:  Procedure Laterality Date   ANTERIOR CERVICAL DISCECTOMY     C3-4, C4-5 diskectomy, fusion, plating and allograft,    BACK SURGERY     CARPAL TUNNEL WITH CUBITAL TUNNEL Left    LUMBAR DISC SURGERY  X 2-3   POSTERIOR CERVICAL FUSION/FORAMINOTOMY     C3-5 fusion with wirin   POSTERIOR LUMBAR FUSION     SPINE SURGERY     s/p C3-4, C4-5 diskectomy, fusion, plating and allograft, s/p posterior C3-5 fusion with wiring(02/11/2007 by Dr Yates) had psudoarthrosis lumbar surgery 2000)   Patient Active Problem List   Diagnosis Date Noted   Pre-diabetes 05/05/2022   Tension headache 08/03/2021   Multiple pulmonary nodules 03/04/2020   PND (post-nasal drip) 12/09/2019   Encounter for screening mammogram for malignant neoplasm of breast 10/20/2019   Risk for falls 03/11/2018   Open-angle glaucoma 11/15/2017   Cramping of hands 11/15/2017   Plantar wart 08/06/2017   Mass of right ear canal 07/04/2017   Screening  for osteoporosis 04/06/2017   Bronchitis due to tobacco use 11/22/2016   Left hand pain 04/16/2014   Mixed incontinence urge and stress 10/06/2013   Chronic cough 08/04/2013   HTN (hypertension) 08/04/2013   Health care maintenance 12/31/2012   Post laminectomy syndrome 01/30/2012   Lumbago 01/30/2012   GERD (gastroesophageal reflux disease) 01/05/2011   HLD (hyperlipidemia) 05/18/2010   DEPRESSION 11/25/2009   Low back pain with radiation 04/16/2009   Tobacco use 03/12/2009   PERIPHERAL NEUROPATHY, LOWER EXTREMITY, LEFT 03/12/2009   Allergic rhinitis 10/09/2007   CARPAL TUNNEL SYNDROME, LEFT 09/12/2007    REFERRING DIAG: M96.1 (ICD-10-CM) - Post laminectomy syndrome  THERAPY DIAG:  Radiculopathy, lumbar region  Muscle weakness (generalized)  Pain in left leg  Rationale for Evaluation and Treatment Rehabilitation  PERTINENT HISTORY: BY other provider:  Patient presents with history of low back pain. History of C3-5 interbody fusion and posterior fixation about 10 years ago. Since then she has had pain in lower back that travels down leg and previously followed with Dr. Ramos ([facility]) where she was getting injections to spine with some relief every 6 months. Prior to that she was following with an orthopedist and was prescribed hydrocodone for low back pain. She currently takes tylenol,   gabapentin to help with pain. The pain is worst in the morning and she has an aid that helps her with laundry and household chores as she is not longer able to complete these tasks.    See above for more info on history.   PRECAUTIONS: None   SUBJECTIVE:                                                                                                                                                                                      SUBJECTIVE STATEMENT: I tried some of the exercises this weekend. I think they help a little.      PAIN:  Are you having pain? Yes: NPRS scale: 5/10 Pain location:  back and LLE  Pain description: sore and stiff Aggravating factors: standing, walking  Relieving factors: sitting, repositioning, laying down    OBJECTIVE: (objective measures completed at initial evaluation unless otherwise dated)  DIAGNOSTIC FINDINGS:  None recent, pt states she may have a referral for MRI    PATIENT SURVEYS:  FOTO 36%   SCREENING FOR RED FLAGS: Bowel or bladder incontinence: No Spinal tumors: No Cauda equina syndrome: No Compression fracture: No Abdominal aneurysm: No   COGNITION: Overall cognitive status: Within functional limits for tasks assessed                          SENSATION: WFL   MUSCLE LENGTH: Hamstrings: tight bilaterally  Thomas test: NT but appears tight    POSTURE: forward head, increased thoracic kyphosis, flexed trunk , and forward head, elevate shoulders     PALPATION: TTP lumbar L side > Rt.  TTP lateral post. Hip, thigh   LUMBAR ROM:    AROM eval  Flexion WFL  + Gower's sign  Extension Lacks 10-15 deg from neutral   Right lateral flexion    Left lateral flexion    Right rotation 50% limited , pain  Left rotation 50% limited , pain   (Blank rows = not tested)   LOWER EXTREMITY ROM:      Passive  Right eval Left eval  Hip flexion WNL WNL   Hip extension limited limited  Hip abduction      Hip adduction      Hip internal rotation WFL tight  Hip external rotation WFL  Tight   Knee flexion      Knee extension Tight  tight  Ankle dorsiflexion Limited  Limited   Ankle plantarflexion      Ankle inversion      Ankle eversion       (Blank rows = not tested)   LOWER EXTREMITY MMT:       MMT Right eval Left eval  Hip flexion 4+ 3+  Hip extension      Hip abduction 4 3  Hip adduction      Hip internal rotation      Hip external rotation      Knee flexion 5 4-  Knee extension 5 4-  Ankle dorsiflexion 5 4  Ankle plantarflexion      Ankle inversion      Ankle eversion       (Blank rows = not tested)   LUMBAR  SPECIAL TESTS:  Straight leg raise test: Negative   FUNCTIONAL TESTS:  NT on eval due to time constraints  04/17/22: 5 x STS 23.8 sec , bariatric chair, with UE 04/17/22: 2 MWT: 368 feet  with SPC   GAIT: Distance walked: 100 Assistive device utilized: Single point cane Level of assistance: Modified independence Comments: flexed forward, min antalgic gait LLE   [facility] Adult PT Treatment:                                                DATE: 05/08/22 Therapeutic Exercise: NuStep level 6 UE and LE for 6 min  Standing LE exercises: heel raises x 15, calf stretch  Partial squats x 10 holding counter  Hip extension , abduction x 15 each, each LE , 2.5 lbs cuff (diagonal)  Seated LAQ 2.5 lbs x 15 with ball squeeze  Seated core press using Pilates circle , breathing x 10  Seated trunk rotation with circle x 5 each direction with breathing  Bridging (partial) x 10 LTR Supine overhead lift for core, added march for lower abs x 10  Hip abduction x 10, coughing when on Rt side Self Care: Importance of squatting for function (transfers)   Core and support for low back in standing, supine , breathing  Progress Advised call Dr. Yates (who did her previous back surgery) for a follow up visit .    [facility] Adult PT Treatment:                                                DATE: 05/04/22 Therapeutic Exercise: Supine hip stretches, post hip, piriformis  SLR LLE x 10  Post pelvic tilt to bridge  x 10 Banded clam supine green band x 20 Partial range bridge with banded clam x 10  Seated ball A/P tilt x 10, mod cues  Horizontal abd green TB  x 10  Sit to stand pain in Rt knee with airex for hgt LAQ 4lbs x 20  LTR with MHP Manual Therapy: LAD x 3 each LE  Modalities: MHP 12 min   [facility] Adult PT Treatment:                                                DATE: 04/27/22 Therapeutic Exercise: Nustep L5 UE and LE 5 min  Hamstring stretch , ITB 30 sec x 2 , LLE more painful  Windshield wiper x 10   Bridging x 10 with legs extended on bolster, L hamstring cramping with standard hooklying , then x 8 heels pressing   into bolster Hooklying green band x 15 clam bilateral, then x 10 unilateral  Sidelying clam GTB x 15  LLE off table: quadratus lumborum stretch in sidelying  Manual therapy: Compression to L piriformis, glute med and Lumbar L paraspinals   10 min MHP to lumbar in supine          [facility] Adult PT Treatment:                                                DATE: 04/25/22 Therapeutic Exercise: NuStep Level 6 UE and LE for 6 min  LAQ , 4 lbs x 20  Bridge 10 x 1 , 3 sec SKTC, 3 each, 30 sec Bridge 10 x 1, 3 sec Hamstring stretch with strap x 3 each, 30 sec LTR 10 sec x 4 each Standing hip extension leaning on high mat table x10 each side Anterior hip stretch in standing cues to posteriorly rotate pelvis Modalities: MHP 10 min supine legs elevated.  Self Care: Heat versus ice [facility] Adult PT Treatment:                                                DATE: 04/17/22 Therapeutic Exercise: Nustep L3 UE/LE x 5 minutes  Bridge 10 x 1 , 3 sec SKTC, 3 each, 30 sec Bridge 10 x 1, 3 sec Hamstring stretch with strap x 3 each, 30 sec LTR 10 sec x 4 each Supne March with ab brace x each Hooklying clam with red band x 15    Therapeutic Activity: 5 x STS: 23.8 with UE 2 MWT: 368 feet     TODAY'S TREATMENT:                                                                                                 DATE: 04/13/22  PT eval. HEP given and performed 1-5 reps each, POC discussed.  Limited due to late arrival    PATIENT EDUCATION:  Education details: HEP, POC  Person educated: Patient Education method: Explanation, Demonstration, and Handouts Education comprehension: verbalized understanding, returned demonstration, and needs further education   HOME EXERCISE PROGRAM: Access Code: [email or web address] Access Code: [email or web address] URL: [email or web address] Date:  05/08/2022 Prepared by: Jennifer Paa  Exercises - Supine Lower Trunk Rotation  - 2 x daily - 7 x weekly - 2 sets - 10 reps - 10 hold - Supine Single Knee to Chest Stretch  - 2 x daily - 7 x weekly - 1 sets - 3-5 reps - 30 hold - Supine Hamstring Stretch with Strap  - 2 x daily - 7 x weekly - 1 sets - 3-5 reps - 30 hold - Supine Bridge  - 2 x daily - 7 x weekly - 2 sets - 10 reps - 5 hold - Sidelying Hip Abduction  - 1 x daily - 7   x weekly - 2 sets - 10 reps - 3 hold - Standing Hip Abduction with Counter Support  - 1 x daily - 7 x weekly - 2 sets - 10 reps - 3 hold - Seated Transversus Abdominis Bracing  - 1 x daily - 7 x weekly - 1 sets - 10 reps - 10 hold  ASSESSMENT:   CLINICAL IMPRESSION: Patient with moderate pain at this time.  She was able to stand for about 10 min for LE strengthening exercises.  She has poor core support with activities.   No further goals met at this point we will continue to challenge her ability to stand within the limits of pain and tolerate activity. Rt knee has been a limiting factor recently, reason unknown.   OBJECTIVE IMPAIRMENTS: Abnormal gait, decreased activity tolerance, decreased balance, decreased endurance, decreased knowledge of use of DME, decreased mobility, difficulty walking, decreased ROM, decreased strength, hypomobility, increased fascial restrictions, impaired flexibility, postural dysfunction, and pain.    ACTIVITY LIMITATIONS: carrying, lifting, bending, sitting, standing, squatting, stairs, transfers, bed mobility, and locomotion level   PARTICIPATION LIMITATIONS: meal prep, cleaning, interpersonal relationship, shopping, and community activity   PERSONAL FACTORS: Past/current experiences and 3+ comorbidities: chronic back pain/surgery, cervical fusion, neuropathy  are also affecting patient's functional outcome.    REHAB POTENTIAL: Excellent   CLINICAL DECISION MAKING: Evolving/moderate complexity   EVALUATION COMPLEXITY: Moderate      GOALS: Goals reviewed with patient? Yes   SHORT TERM GOALS: Target date: 05/11/2022   Pt will be I with initial HEP for trunk/hips and core Baseline: Goal status: MET   2.  Pt will be screened for balance and functional testing, goals set (2 min walk, 5 x STS) Baseline: not captured Status: captured 04/17/22- see functional testing  Goal status: MET   3.  Pt will report improvement in back and leg pain (20-25%) with ADLs.  Baseline:  Goal status: ongoing      LONG TERM GOALS: Target date: 06/08/2022   Pt will be I with final HEP upon discharge for longer lasting mobility and health  Baseline:  Goal status: INITIAL   2.  FOTO score will improve to 46% or better to demo functional improvement  Baseline:  Goal status: INITIAL   3.  Pt will be able to stand for light housework, meal prep 15-20 min with pain no more than 4/10 in back and LLE .  Baseline:  Goal status: INITIAL   4.  Pt will improve LLE strength to 5/5 in hip, knee for optimal transfers, gait and function.   Baseline:  Goal status: INITIAL   5.  Pt will improve trunk extension in standing with min cues and pain minimal.  Baseline:  Goal status: INITIAL  6. Patient will be able to walk to her mailbox with no more than min pain in low back Baseline: severe pain Goal status NEW PLAN:   PT FREQUENCY: 2x/week   PT DURATION: 8 weeks   PLANNED INTERVENTIONS: Therapeutic exercises, Therapeutic activity, Neuromuscular re-education, Balance training, Gait training, Patient/Family education, Self Care, Joint mobilization, Aquatic Therapy, Dry Needling, Electrical stimulation, Spinal mobilization, Cryotherapy, Moist heat, Manual therapy, and Re-evaluation.   PLAN FOR NEXT SESSION: walking (laps) for warm up,  standing as tolerated.  nustep, work towards goals,  LLE strength   Raeford Razor, PT 05/08/22 10:26 AM Phone: [phone] Fax: [phone]    PHYSICAL THERAPY DISCHARGE SUMMARY  Visits from  Start of Care: 6  Current functional level related to goals /  functional outcomes: See above    Remaining deficits: unknown   Education / Equipment: HEP   Patient agrees to discharge. Patient goals were not met. Patient is being discharged due to not returning since the last visit.  Jennifer Paa, PT 06/21/22 8:40 AM Phone: [phone] Fax: [phone]      

## 2022-05-05 NOTE — Assessment & Plan Note (Signed)
She is adherent with crestor 10 mg for secondary prevention. Repeat lipid panel with LDL at goal at 66. P: Continue crestor 10 mg

## 2022-05-05 NOTE — Assessment & Plan Note (Signed)
Screening mammogram ordered

## 2022-05-08 ENCOUNTER — Encounter: Payer: Self-pay | Admitting: Physical Therapy

## 2022-05-08 ENCOUNTER — Ambulatory Visit: Payer: Medicare Other | Admitting: Physical Therapy

## 2022-05-08 DIAGNOSIS — M5416 Radiculopathy, lumbar region: Secondary | ICD-10-CM | POA: Diagnosis not present

## 2022-05-08 DIAGNOSIS — M79605 Pain in left leg: Secondary | ICD-10-CM

## 2022-05-08 DIAGNOSIS — M6281 Muscle weakness (generalized): Secondary | ICD-10-CM

## 2022-05-08 NOTE — Assessment & Plan Note (Signed)
Hx of C3-5 interbody fusion and posterior fixation about 10 years ago. Previously followed with Dr. Nelva Bush with [facility]. She reports improved pain since going to PT. She is taking tylenol 500 mg BID and her pain is tolerable. P: Continue to monitor

## 2022-05-09 ENCOUNTER — Ambulatory Visit (HOSPITAL_COMMUNITY)
Admission: RE | Admit: 2022-05-09 | Discharge: 2022-05-09 | Disposition: A | Discharge status: Home / Self Care | Payer: Medicare Other | Source: Ambulatory Visit | Attending: Internal Medicine | Admitting: Internal Medicine

## 2022-05-09 DIAGNOSIS — R911 Solitary pulmonary nodule: Secondary | ICD-10-CM | POA: Diagnosis present

## 2022-05-15 ENCOUNTER — Telehealth: Payer: Self-pay | Admitting: *Deleted

## 2022-05-15 ENCOUNTER — Ambulatory Visit: Payer: Medicare Other | Admitting: Physical Therapy

## 2022-05-15 ENCOUNTER — Other Ambulatory Visit (INDEPENDENT_AMBULATORY_CARE_PROVIDER_SITE_OTHER): Payer: Medicare Other | Admitting: Internal Medicine

## 2022-05-15 ENCOUNTER — Telehealth: Payer: Self-pay | Admitting: Physical Therapy

## 2022-05-15 ENCOUNTER — Telehealth: Payer: Self-pay | Admitting: Internal Medicine

## 2022-05-15 DIAGNOSIS — J439 Emphysema, unspecified: Secondary | ICD-10-CM

## 2022-05-15 DIAGNOSIS — R053 Chronic cough: Secondary | ICD-10-CM

## 2022-05-15 NOTE — Telephone Encounter (Signed)
Patient was contacted at the request of Dr. Joellen Jersey Masters to schedule an appointment this week for worsening back pain.  No answer, but was able to leave detailed message to call our office back.

## 2022-05-15 NOTE — Progress Notes (Signed)
Chest CT showed stable solid pulmonary nodules from 2016 chest CT.  Reticular opacities appear worsened from 2016 concerning for fibrotic interstitial lung disease.  Called and reviewed results with patient.  She has worsening cough that is productive over the last several years.  PFTs have been ordered in the past but never been completed. P: PFT ordered, message sent to [facility] staff to help with coordinating test. If restrictive pattern is shown then would consider high-resolution CT versus referral to pulmonology.

## 2022-05-15 NOTE — Telephone Encounter (Signed)
I was requested to return a phone call from Mrs. Ermalinda Barrios.  She stated that she has been in a great deal of pain for several days.  She is barely able to walk due to her pain .  She would like to be placed on HOLD for Pt at this time until her doctor further evaluates her .  She is interested in trying the pool if and when she is able.  Will stay in touch and I will reach out in a couple of weeks to get an update.  Raeford Razor, PT 05/15/22 10:51 AM Phone: [phone] Fax: [phone]

## 2022-05-15 NOTE — Telephone Encounter (Signed)
-----   Message from Christiana Fuchs, [place] sent at 05/15/2022  1:06 PM EST ----- Regarding: scheduling PFT Hello,  Ms. Penaloza needs a pulmonary function test scheduled.  Unsure if you guys are the right person for this, if not please let me know best person to message for help with scheduling.  Thanks, Dr. Howie Ill

## 2022-05-17 ENCOUNTER — Ambulatory Visit (INDEPENDENT_AMBULATORY_CARE_PROVIDER_SITE_OTHER): Payer: Medicare Other | Admitting: Student

## 2022-05-17 ENCOUNTER — Encounter: Payer: Self-pay | Admitting: Student

## 2022-05-17 VITALS — BP 131/79 | HR 81 | Ht 67.0 in | Wt 202.0 lb

## 2022-05-17 DIAGNOSIS — M545 Low back pain, unspecified: Secondary | ICD-10-CM

## 2022-05-17 MED ORDER — TIZANIDINE HCL 4 MG PO TABS: 4.0000 mg | ORAL_TABLET | Freq: Three times a day (TID) | ORAL | 0 refills | Status: AC | PRN

## 2022-05-17 MED ORDER — ACETAMINOPHEN 500 MG PO TABS: 1000.0000 mg | ORAL_TABLET | Freq: Three times a day (TID) | ORAL | 2 refills | Status: AC | PRN

## 2022-05-17 MED ORDER — DULOXETINE HCL 30 MG PO CPEP: 60.0000 mg | ORAL_CAPSULE | Freq: Every day | ORAL | 11 refills | Status: DC

## 2022-05-17 MED ORDER — DICLOFENAC SODIUM 1 % EX GEL: 4.0000 g | Freq: Four times a day (QID) | CUTANEOUS | 3 refills | Status: DC

## 2022-05-17 MED ORDER — KETOROLAC TROMETHAMINE 30 MG/ML IJ SOLN
30.0000 mg | Freq: Once | INTRAMUSCULAR | Status: AC
  Administered 2022-05-17: 30 mg via INTRAMUSCULAR

## 2022-05-17 MED ORDER — KETOROLAC TROMETHAMINE 30 MG/ML IJ SOLN: 15.0000 mg | Freq: Once | INTRAMUSCULAR | Status: DC

## 2022-05-17 NOTE — Assessment & Plan Note (Addendum)
Patient with longstanding chronic back pain with radiculopathy previously being managed with Tylenol and Gabapentin presenting to clinic today after worsening of back pain L>R for the past 2 weeks. Patient was previously seen by  PMR specialist s/p injections; however  no more threapies were offered.   Pain is characterized as 6/10 L>R muscular. No radiation to the front or to the lower extremities. Worse with palpation. No saddle anesthesia or urinary or rectal sphicter changes. No acute motor or sensory changes. Reviewed ROS and no other red flags were identified.  On exam, normal bulk and tone in upper and lower extremities. Point tenderness along L paraspinal muscles with muscle tension and spams noted on exam. Mild discomfort with straight leg raise  Given history and physical exam, not inclined to order imaging studies today. Suspect there is muscular spasm and strain contributing to pain.  Patient received a 1x '30mg'$  toradol and was advised to use a short term of Tizanidine for spasms, especially as she continues to work with PT. Patient requested topical voltaren gel, which has worked in the past. Will also start Duloxetine '30mg'$  first week with instructions to increase to '60mg'$  nightly. Increased Tylenol strength to '1000mg'$  TID. -Follow up referral to pain clinic -increased Tylenol '1000mg'$  TID PRN -Started on Duloxetine '30mg'$  daily first week -> '60mg'$  daily thereafter -Voltaren gel -5 day course of Tizanidine '4mg'$  PRN -Referral to new pain clinic placed after consultation with referral coordinator

## 2022-05-17 NOTE — Patient Instructions (Signed)
Thank you, Ms.Kyesha CARLTON SWEANEY for allowing [place] to provide your care today. Today we discussed your back pain: you received a dose of toradol to minimize pain today.   Please use the short tem tizanidine for muscle spasms. You should also start Duloxetine. You should start by taking 1 tablet (30 mg) the first week. Once that week is over, take two pills daily (total 60 mg daily).  We are also increasing the dose of your Tylenol to 1000 mg every 8 hours as your need it.  Continue your other medications as you were prescribed.  Please return to clinic if the symptoms do not improve  Please call us back if you don't get your pulmonary function tests scheduled.   Please make sure to arrive 15 minutes prior to your next appointment. If you arrive late, you may be asked to reschedule.    We look forward to seeing you next time. Please call our clinic at [phone] if you have any questions or concerns. The best time to call is Monday-Friday from 9am-4pm, but there is someone available 24/7. If after hours or the weekend, call the main hospital number and ask for the Internal Medicine Resident On-Call. If you need medication refills, please notify your pharmacy one week in advance and they will send [place] a request.   Thank you for letting us take part in your care. Wishing you the best!  Romana Juniper, MD 05/17/2022, 2:02 PM [facility] Internal Medicine Resident, PGY-1

## 2022-05-17 NOTE — Addendum Note (Signed)
Addended by: Charise Killian on: 05/17/2022 10:26 PM   Modules accepted: Level of Service

## 2022-05-17 NOTE — Progress Notes (Signed)
Internal Medicine Clinic Attending  I saw and evaluated the patient.  I personally confirmed the key portions of the history and exam documented by Dr. Simeon Craft and I reviewed pertinent patient test results.  The assessment, diagnosis, and plan were formulated together and I agree with the documentation in the resident's note.

## 2022-05-17 NOTE — Progress Notes (Signed)
Subjective:  CC: chronic back pain  HPI:  Laura [name] is a 76 y.o. female with a past medical history stated below and presents today for chronic back pain. Please see problem based assessment and plan for additional details.  Past Medical History:  Diagnosis Date   Chronic back pain    "mostly lower; left foot is numb all the time" (11/22/2016)   Chronic kidney disease    ?renal cyst on MRI    GERD (gastroesophageal reflux disease)    High cholesterol    Lymphadenopathy    documented on chest CT scan 12/15/2005   Substance abuse (HCC)    tobacco    Current Outpatient Medications on File Prior to Visit  Medication Sig Dispense Refill   albuterol (VENTOLIN HFA) 108 (90 Base) MCG/ACT inhaler INHALE 1-2 PUFFS BY MOUTH EVERY 6 HOURS AS NEEDED FOR WHEEZE OR SHORTNESS OF BREATH 8.5 each 2   amLODipine (NORVASC) 10 MG tablet Take 1 tablet (10 mg total) by mouth daily. 90 tablet 6   aspirin 81 MG chewable tablet Chew 81 mg by mouth daily.     fluticasone (FLONASE) 50 MCG/ACT nasal spray USE 1-2 SPRAYS IN EACH NOSTRIL DAILY 48 mL 3   gabapentin (NEURONTIN) 600 MG tablet Take 1 tablet (600 mg total) by mouth 4 (four) times daily. 120 tablet 2   metoprolol succinate (TOPROL-XL) 25 MG 24 hr tablet Take 25 mg by mouth daily.     mirabegron ER (MYRBETRIQ) 25 MG TB24 tablet Take 1 tablet (25 mg total) by mouth daily. (Patient not taking: Reported on 04/13/2022) 90 tablet 3   pantoprazole (PROTONIX) 40 MG tablet TAKE 1 TABLET BY MOUTH EVERY DAY 90 tablet 2   rosuvastatin (CRESTOR) 10 MG tablet Take 1 tablet (10 mg total) by mouth daily. 90 tablet 3   Sodium Chloride-Sodium Bicarb (NETI POT SINUS WASH) 2300-700 MG KIT Place 1 kit into the nose daily. 1 kit 0   No current facility-administered medications on file prior to visit.    Family History  Problem Relation Age of Onset   Diabetes Father    Hypertension Father     Social History   Socioeconomic History   Marital status:  Divorced    Spouse name: Not on file   Number of children: 0   Years of education: Not on file   Highest education level: Not on file  Occupational History    Employer: UNEMPLOYED  Tobacco Use   Smoking status: Former    Packs/day: 0.50    Years: 47.00    Total pack years: 23.50    Types: Cigarettes    Quit date: 05/13/2020    Years since quitting: 2.0   Smokeless tobacco: Never   Tobacco comments:    stopped around Thanksgiving   Vaping Use   Vaping Use: Never used  Substance and Sexual Activity   Alcohol use: Yes    Alcohol/week: 0.0 standard drinks of alcohol    Comment: 11/22/2016 "2 drinks q other holiday"   Drug use: Not Currently    Types: Marijuana    Comment: 66/2018 "I stopped in the 1970s"   Sexual activity: Yes  Other Topics Concern   Not on file  Social History Narrative   Patient's new Phone # [phone] and [phone].   Social Determinants of Health   Financial Resource Strain: Low Risk  (05/04/2022)   Overall Financial Resource Strain (CARDIA)    Difficulty of Paying Living Expenses: Not very hard  Food Insecurity: No Food Insecurity (05/04/2022)   Hunger Vital Sign    Worried About Running Out of Food in the Last Year: Never true    Ran Out of Food in the Last Year: Never true  Transportation Needs: No Transportation Needs (05/04/2022)   PRAPARE - Hydrologist (Medical): No    Lack of Transportation (Non-Medical): No  Physical Activity: Inactive (05/04/2022)   Exercise Vital Sign    Days of Exercise per Week: 0 days    Minutes of Exercise per Session: 10 min  Stress: No Stress Concern Present (05/04/2022)   [place]    Feeling of Stress : Only a little  Social Connections: Moderately Integrated (05/04/2022)   Social Connection and Isolation Panel [NHANES]    Frequency of Communication with Friends and Family: More than three times a week    Frequency of  Social Gatherings with Friends and Family: More than three times a week    Attends Religious Services: More than 4 times per year    Active Member of [employer] or Organizations: Yes    Attends Music therapist: More than 4 times per year    Marital Status: Divorced  Intimate Partner Violence: Not At Risk (05/04/2022)   Humiliation, Afraid, Rape, and Kick questionnaire    Fear of Current or Ex-Partner: No    Emotionally Abused: No    Physically Abused: No    Sexually Abused: No    Review of Systems: ROS negative except for what is noted on the assessment and plan.  Objective:   Vitals:   05/17/22 1325  BP: 131/79  Pulse: 81  SpO2: 96%  Weight: 202 lb (91.6 kg)  Height: _0  (1.702 m)    Physical Exam: Constitutional: well-appearing woman sitting in chair, in mild distress HENT: normocephalic atraumatic, mucous membranes moist Eyes: conjunctiva non-erythematous Neck: supple Cardiovascular: regular rate and rhythm, no m/r/g Pulmonary/Chest: normal work of breathing on room air, lungs clear to auscultation bilaterally Abdominal: soft, non-tender, non-distended MSK: normal bulk and tone in upper and lower extremities. Point tenderness along L paraspinal muscles with muscle tension and spams noted on exam. Mild discomfort with straight leg raise Neurological: alert & oriented x 3, 5/5 strength in bilateral upper and lower extremities, antalgic gait with cane Skin: warm and dry Psych: normal mood and affect     Assessment & Plan:   Low back pain with radiation Patient with longstanding chronic back pain with radiculopathy previously being managed with Tylenol and Gabapentin presenting to clinic today after worsening of back pain L>R for the past 2 weeks. Patient was previously seen by  PMR specialist s/p injections; however  no more threapies were offered.   Pain is characterized as 6/10 L>R muscular. No radiation to the front or to the lower extremities. Worse with  palpation. No saddle anesthesia or urinary or rectal sphicter changes. No acute motor or sensory changes. Reviewed ROS and no other red flags were identified.  On exam, normal bulk and tone in upper and lower extremities. Point tenderness along L paraspinal muscles with muscle tension and spams noted on exam. Mild discomfort with straight leg raise  Given history and physical exam, not inclined to order imaging studies today. Suspect there is muscular spasm and strain contributing to pain.  Patient received a 1x 82m toradol and was advised to use a short term of Tizanidine for spasms, especially as she continues to work  with PT. Patient requested topical voltaren gel, which has worked in the past. Will also start Duloxetine 80m first week with instructions to increase to 690mnightly. Increased Tylenol strength to 100053mID. -Follow up referral to pain clinic -increased Tylenol 1000m38mD PRN -Started on Duloxetine 30mg66mly first week -> 60mg 5my thereafter -Voltaren gel -5 day course of Tizanidine 4mg PR64mReferral to new pain clinic placed after consultation with referral coordinator    Patient seen with Dr. LauSaverio Danker

## 2022-05-23 ENCOUNTER — Ambulatory Visit (HOSPITAL_COMMUNITY)
Admission: RE | Admit: 2022-05-23 | Discharge: 2022-05-23 | Disposition: A | Discharge status: Home / Self Care | Payer: Medicare Other | Source: Ambulatory Visit | Attending: Internal Medicine | Admitting: Internal Medicine

## 2022-05-23 DIAGNOSIS — J439 Emphysema, unspecified: Secondary | ICD-10-CM

## 2022-05-23 DIAGNOSIS — R053 Chronic cough: Secondary | ICD-10-CM

## 2022-05-23 LAB — PULMONARY FUNCTION TEST
DL/VA % pred: 63 %
DL/VA: 2.55 ml/min/mmHg/L
DLCO unc % pred: 48 %
DLCO unc: 10.36 ml/min/mmHg
FEF 25-75 Post: 2.59 L/sec
FEF 25-75 Pre: 2.31 L/sec
FEF2575-%Change-Post: 11 %
FEF2575-%Pred-Post: 137 %
FEF2575-%Pred-Pre: 122 %
FEV1-%Change-Post: 4 %
FEV1-%Pred-Post: 87 %
FEV1-%Pred-Pre: 83 %
FEV1-Post: 2.13 L
FEV1-Pre: 2.03 L
FEV1FVC-%Change-Post: 2 %
FEV1FVC-%Pred-Pre: 107 %
FEV6-%Change-Post: 2 %
FEV6-%Pred-Post: 84 %
FEV6-%Pred-Pre: 82 %
FEV6-Post: 2.6 L
FEV6-Pre: 2.53 L
FEV6FVC-%Pred-Post: 104 %
FEV6FVC-%Pred-Pre: 104 %
FVC-%Change-Post: 2 %
FVC-%Pred-Post: 80 %
FVC-%Pred-Pre: 78 %
FVC-Post: 2.6 L
FVC-Pre: 2.53 L
Post FEV1/FVC ratio: 82 %
Post FEV6/FVC ratio: 100 %
Pre FEV1/FVC ratio: 81 %
Pre FEV6/FVC Ratio: 100 %
RV % pred: 132 %
RV: 3.21 L
TLC % pred: 108 %
TLC: 5.98 L

## 2022-05-23 MED ORDER — ALBUTEROL SULFATE (2.5 MG/3ML) 0.083% IN NEBU
2.5000 mg | INHALATION_SOLUTION | Freq: Once | RESPIRATORY_TRACT | Status: AC
  Administered 2022-05-23: 2.5 mg via RESPIRATORY_TRACT

## 2022-05-24 ENCOUNTER — Telehealth: Payer: Self-pay | Admitting: *Deleted

## 2022-05-24 DIAGNOSIS — J84112 Idiopathic pulmonary fibrosis: Secondary | ICD-10-CM | POA: Insufficient documentation

## 2022-05-24 DIAGNOSIS — J849 Interstitial pulmonary disease, unspecified: Secondary | ICD-10-CM | POA: Insufficient documentation

## 2022-05-24 NOTE — Addendum Note (Signed)
Addended by: Edwyna Perfect on: 05/24/2022 04:43 PM   Modules accepted: Orders

## 2022-05-24 NOTE — Telephone Encounter (Signed)
Call from patient states taht she was given Voltaren Gel to rub on her back.  Patient states is unable to rub anything on her back and would prefer Voltaren pills.   Patient was also prescribed Duloxetine states read up on medication states does not want to take that type of medication that is used for depression as she is not depressed.  Also would like to get a RTC from Dr. Howie Ill who attempted to call her about test results.

## 2022-05-24 NOTE — Assessment & Plan Note (Addendum)
Addendum 05/24/22: CT chest ordered to follow-up on pulmonary nodule.  Pulmonary nodule was stable from 2016 with no further follow-up imaging recommended.  CT showed signs concerning for fibrotic interstitial lung disease with probable UIP pattern.   When I called and reviewed results with patient she stated that she was having increased shortness of breath, worsening cough when she laid on her right side. PFT ordered.   PFT shows moderately severe diffusion defect consistent with pulmonary hypertension.   I called and reviewed results with patient.  She will need follow-up with pulmonology.  Referral made.

## 2022-05-31 NOTE — Telephone Encounter (Signed)
Patient calling back stating she has not started duloxetine as she is taking 2400 mg of gabapentin/day and doesn't think she should take both. States gabapentin does not control pain so she has to take Tylenol as well. She is requesting call back from PCP to discuss this.

## 2022-06-02 NOTE — Telephone Encounter (Signed)
PFTs were performed on 05/23/22

## 2022-06-08 ENCOUNTER — Other Ambulatory Visit: Payer: Self-pay | Admitting: Internal Medicine

## 2022-06-11 ENCOUNTER — Other Ambulatory Visit: Payer: Self-pay | Admitting: Student

## 2022-06-11 DIAGNOSIS — M545 Low back pain, unspecified: Secondary | ICD-10-CM

## 2022-07-05 ENCOUNTER — Ambulatory Visit: Payer: 59

## 2022-07-07 ENCOUNTER — Inpatient Hospital Stay: Admission: RE | Admit: 2022-07-07 | Payer: 59 | Source: Ambulatory Visit

## 2022-07-13 ENCOUNTER — Ambulatory Visit
Admission: RE | Admit: 2022-07-13 | Discharge: 2022-07-13 | Disposition: A | Discharge status: Home / Self Care | Payer: 59 | Source: Ambulatory Visit | Attending: Internal Medicine | Admitting: Internal Medicine

## 2022-07-13 DIAGNOSIS — Z1231 Encounter for screening mammogram for malignant neoplasm of breast: Secondary | ICD-10-CM

## 2022-07-16 ENCOUNTER — Other Ambulatory Visit: Payer: Self-pay | Admitting: Internal Medicine

## 2022-07-16 DIAGNOSIS — K219 Gastro-esophageal reflux disease without esophagitis: Secondary | ICD-10-CM

## 2022-07-20 ENCOUNTER — Other Ambulatory Visit: Payer: Self-pay

## 2022-07-20 DIAGNOSIS — M961 Postlaminectomy syndrome, not elsewhere classified: Secondary | ICD-10-CM

## 2022-07-20 MED ORDER — GABAPENTIN 600 MG PO TABS: 600.0000 mg | ORAL_TABLET | Freq: Four times a day (QID) | ORAL | 3 refills | Status: DC

## 2022-08-14 ENCOUNTER — Other Ambulatory Visit: Payer: Self-pay | Admitting: Student

## 2022-08-14 DIAGNOSIS — E7800 Pure hypercholesterolemia, unspecified: Secondary | ICD-10-CM

## 2022-08-23 ENCOUNTER — Institutional Professional Consult (permissible substitution): Payer: 59 | Admitting: Pulmonary Disease

## 2022-09-07 ENCOUNTER — Ambulatory Visit (INDEPENDENT_AMBULATORY_CARE_PROVIDER_SITE_OTHER): Payer: 59 | Admitting: Pulmonary Disease

## 2022-09-07 ENCOUNTER — Encounter: Payer: Self-pay | Admitting: Pulmonary Disease

## 2022-09-07 VITALS — BP 128/76 | HR 78 | Temp 98.0°F | Ht 67.0 in | Wt 202.0 lb

## 2022-09-07 DIAGNOSIS — J849 Interstitial pulmonary disease, unspecified: Secondary | ICD-10-CM | POA: Diagnosis not present

## 2022-09-07 NOTE — Progress Notes (Signed)
Laura [name]    [id number]    1946/02/05  Primary Care Physician:Masters, Joellen [place], DO  Referring Physician: Christiana Fuchs, DO [address] [place],  [place] [address]  Chief complaint: Consult for abnormal CT  HPI: 77 y.o. who  has a past medical history of Chronic back pain, Chronic kidney disease, GERD (gastroesophageal reflux disease), High cholesterol, Lymphadenopathy, and Substance abuse ([place]).   Referred for evaluation of abnormal imaging on low-dose screening CT which showed pulmonary fibrosis History notable for ongoing smoking, marijuana use.  She used cocaine until 2021  Has mild dyspnea on exertion.  Denies any cough, chest congestion or sputum production.  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  No feather pillows or comforters. Smoking history: 28-pack-year smoker.  Smokes up to 8 cigarettes/day and 1-2 marijuana joints per day Travel history: Previously lived in [place] in [place].  No significant recent travel Relevant family history: Her siblings have unspecified lung issues.  The are smokers.  Outpatient Encounter Medications as of 09/07/2022  Medication Sig   aspirin 81 MG chewable tablet Chew 81 mg by mouth daily.   diclofenac Sodium (VOLTAREN) 1 % GEL Apply 4 g topically 4 (four) times daily.   fluticasone (FLONASE) 50 MCG/ACT nasal spray USE 1-2 SPRAYS IN EACH NOSTRIL DAILY   gabapentin (NEURONTIN) 600 MG tablet Take 1 tablet (600 mg total) by mouth 4 (four) times daily.   GEMTESA 75 MG TABS Take 1 tablet by mouth daily.   metoprolol succinate (TOPROL-XL) 25 MG 24 hr tablet Take 25 mg by mouth daily.   pantoprazole (PROTONIX) 40 MG tablet TAKE 1 TABLET BY MOUTH EVERY DAY   rosuvastatin (CRESTOR) 10 MG tablet Take 1 tablet (10 mg total) by mouth daily.   Sodium Chloride-Sodium Bicarb (NETI POT SINUS WASH) 2300-700 MG KIT Place 1 kit into  the nose daily.   acetaminophen (TYLENOL) 500 MG tablet Take 2 tablets (1,000 mg total) by mouth every 8 (eight) hours as needed. (Patient not taking: Reported on 09/07/2022)   albuterol (VENTOLIN HFA) 108 (90 Base) MCG/ACT inhaler INHALE 1-2 PUFFS BY MOUTH EVERY 6 HOURS AS NEEDED FOR WHEEZE OR SHORTNESS OF BREATH (Patient not taking: Reported on 09/07/2022)   amLODipine (NORVASC) 10 MG tablet Take 1 tablet (10 mg total) by mouth daily.   [DISCONTINUED] DULoxetine (CYMBALTA) 30 MG capsule TAKE 2 CAPSULES BY MOUTH EVERY DAY   [DISCONTINUED] mirabegron ER (MYRBETRIQ) 25 MG TB24 tablet Take 1 tablet (25 mg total) by mouth daily. (Patient not taking: Reported on 04/13/2022)   No facility-administered encounter medications on file as of 09/07/2022.    Allergies as of 09/07/2022   (No Known Allergies)    Past Medical History:  Diagnosis Date   Chronic back pain    "mostly lower; left foot is numb all the time" (11/22/2016)   Chronic kidney disease    ?renal cyst on MRI    GERD (gastroesophageal reflux disease)    High cholesterol    Lymphadenopathy    documented on chest CT scan 12/15/2005   Substance abuse (HCC)    tobacco    Past Surgical History:  Procedure Laterality Date   ANTERIOR CERVICAL DISCECTOMY     C3-4, C4-5 diskectomy, fusion, plating and allograft,    BACK SURGERY     CARPAL TUNNEL WITH CUBITAL TUNNEL Left    LUMBAR DISC SURGERY  X 2-3  POSTERIOR CERVICAL FUSION/FORAMINOTOMY     C3-5 fusion with wirin   POSTERIOR LUMBAR FUSION     SPINE SURGERY     s/p C3-4, C4-5 diskectomy, fusion, plating and allograft, s/p posterior C3-5 fusion with wiring(02/11/2007 by Dr Lorin Mercy) had psudoarthrosis lumbar surgery 2000)    Family History  Problem Relation Age of Onset   Diabetes Father    Hypertension Father     Social History   Socioeconomic History   Marital status: Divorced    Spouse name: Not on file   Number of children: 0   Years of education: Not on file   Highest  education level: Not on file  Occupational History    Employer: UNEMPLOYED  Tobacco Use   Smoking status: Former    Packs/day: 0.50    Years: 47.00    Additional pack years: 0.00    Total pack years: 23.50    Types: Cigarettes    Quit date: 05/13/2020    Years since quitting: 2.3   Smokeless tobacco: Never   Tobacco comments:    stopped around Thanksgiving   Vaping Use   Vaping Use: Never used  Substance and Sexual Activity   Alcohol use: Yes    Alcohol/week: 0.0 standard drinks of alcohol    Comment: 11/22/2016 "2 drinks q other holiday"   Drug use: Not Currently    Types: Marijuana    Comment: 66/2018 "I stopped in the 1970s"   Sexual activity: Yes  Other Topics Concern   Not on file  Social History Narrative   Patient's new Phone # [phone] and [phone].   Social Determinants of Health   Financial Resource Strain: Low Risk  (05/04/2022)   Overall Financial Resource Strain (CARDIA)    Difficulty of Paying Living Expenses: Not very hard  Food Insecurity: No Food Insecurity (05/04/2022)   Hunger Vital Sign    Worried About Running Out of Food in the Last Year: Never true    Ran Out of Food in the Last Year: Never true  Transportation Needs: No Transportation Needs (05/04/2022)   PRAPARE - Hydrologist (Medical): No    Lack of Transportation (Non-Medical): No  Physical Activity: Inactive (05/04/2022)   Exercise Vital Sign    Days of Exercise per Week: 0 days    Minutes of Exercise per Session: 10 min  Stress: No Stress Concern Present (05/04/2022)   [place]    Feeling of Stress : Only a little  Social Connections: Moderately Integrated (05/04/2022)   Social Connection and Isolation Panel [NHANES]    Frequency of Communication with Friends and Family: More than three times a week    Frequency of Social Gatherings with Friends and Family: More than three times a week     Attends Religious Services: More than 4 times per year    Active Member of [employer] or Organizations: Yes    Attends Music therapist: More than 4 times per year    Marital Status: Divorced  Intimate Partner Violence: Not At Risk (05/04/2022)   Humiliation, Afraid, Rape, and Kick questionnaire    Fear of Current or Ex-Partner: No    Emotionally Abused: No    Physically Abused: No    Sexually Abused: No    Review of systems: Review of Systems  Constitutional: Negative for fever and chills.  HENT: Negative.   Eyes: Negative for blurred vision.  Respiratory: as per HPI  Cardiovascular: Negative  for chest pain and palpitations.  Gastrointestinal: Negative for vomiting, diarrhea, blood per rectum. Genitourinary: Negative for dysuria, urgency, frequency and hematuria.  Musculoskeletal: Negative for myalgias, back pain and joint pain.  Skin: Negative for itching and rash.  Neurological: Negative for dizziness, tremors, focal weakness, seizures and loss of consciousness.  Endo/Heme/Allergies: Negative for environmental allergies.  Psychiatric/Behavioral: Negative for depression, suicidal ideas and hallucinations.  All other systems reviewed and are negative.  Physical Exam: Blood pressure 128/76, pulse 78, temperature 98 F (36.7 C), temperature source Oral, height 5\' 7"  (1.702 m), weight 202 lb (91.6 kg), SpO2 95 %. Gen:      No acute distress HEENT:  EOMI, sclera anicteric Neck:     No masses; no thyromegaly Lungs:    Clear to auscultation bilaterally; normal respiratory effort CV:         Regular rate and rhythm; no murmurs Abd:      + bowel sounds; soft, non-tender; no palpable masses, no distension Ext:    No edema; adequate peripheral perfusion Skin:      Warm and dry; no rash Neuro: alert and oriented x 3 Psych: normal mood and affect  Data Reviewed: Imaging: CT chest 12/09/2014-mild emphysema, subcentimeter pulmonary nodule, subtle reticulation at the lung  base CT chest 05/09/2022-stable pulmonary nodule, basilar predominant reticular opacities with traction bronchiectasis appears progressive from 2016. I have reviewed the images personally.  PFTs: 05/23/2022 FVC 2.60 [80%], FEV1 2.13 [87%], F/F82, TLC 5.98 [108%], DLCO 13.36 [48%] Severe diffusion defect  Labs:  Assessment:  Evaluation for interstitial lung disease CT shows pulmonary fibrosis in probable UIP pattern.  There are subtle changes back in 2016 and appears to have progressed since then.  There are no significant exposures or symptoms of autoimmune disease  PFTs reviewed with isolated diffusion defect with normal lung volumes and spirometry.  Will need to rule out pulmonary hypertension as a cause  Check serologies for ILD assessment Schedule high-res CT and echocardiogram Return to clinic in 1 month  Plan/Recommendations: Labs, high-res CT, echo   Marshell Garfinkel MD [facility] 09/07/2022, 10:04 AM  CC: Masters, Joellen [place], DO

## 2022-09-07 NOTE — Patient Instructions (Signed)
Will get some labs for further evaluation of the scarring in the lung We will also order high-resolution CT and echocardiogram Return to clinic in 1 month for review and plan for next steps

## 2022-09-08 ENCOUNTER — Other Ambulatory Visit (INDEPENDENT_AMBULATORY_CARE_PROVIDER_SITE_OTHER): Payer: 59

## 2022-09-08 DIAGNOSIS — J849 Interstitial pulmonary disease, unspecified: Secondary | ICD-10-CM | POA: Diagnosis not present

## 2022-09-08 LAB — CK: Total CK: 80 U/L (ref 7–177)

## 2022-09-11 LAB — ALDOLASE: Aldolase: 5.1 U/L (ref <=8.1)

## 2022-09-12 LAB — ANTI-DNA ANTIBODY, DOUBLE-STRANDED: ds DNA Ab: <1 IU/mL

## 2022-09-12 LAB — ANTI-NUCLEAR AB-TITER (ANA TITER): ANA Titer 1: 1:80 {titer} — ABNORMAL HIGH

## 2022-09-12 LAB — ANTI-SCLERODERMA ANTIBODY: Scleroderma (Scl-70) (ENA) Antibody, IgG: <1 AI

## 2022-09-12 LAB — ANA: Anti Nuclear Antibody (ANA): POSITIVE — AB

## 2022-09-12 LAB — RHEUMATOID FACTOR: Rheumatoid fact SerPl-aCnc: <14 IU/mL (ref <14)

## 2022-09-12 LAB — SJOGREN'S SYNDROME ANTIBODS(SSA + SSB)
SSA (Ro) (ENA) Antibody, IgG: <1 AI
SSB (La) (ENA) Antibody, IgG: <1 AI

## 2022-09-12 LAB — CYCLIC CITRUL PEPTIDE ANTIBODY, IGG: Cyclic Citrullin Peptide Ab: <16 UNITS

## 2022-09-16 LAB — HYPERSENSITIVITY PNEUMONITIS
A. Pullulans Abs: NEGATIVE
A.Fumigatus #1 Abs: NEGATIVE
Micropolyspora faeni, IgG: NEGATIVE
Pigeon Serum Abs: NEGATIVE
Thermoact. Saccharii: NEGATIVE
Thermoactinomyces vulgaris, IgG: NEGATIVE

## 2022-09-16 LAB — RNP ANTIBODIES: ENA RNP Ab: <0.2 AI (ref 0.0–0.9)

## 2022-09-27 ENCOUNTER — Ambulatory Visit (HOSPITAL_COMMUNITY)
Admission: RE | Admit: 2022-09-27 | Discharge: 2022-09-27 | Disposition: A | Discharge status: Home / Self Care | Payer: 59 | Source: Ambulatory Visit | Attending: Internal Medicine | Admitting: Internal Medicine

## 2022-09-27 DIAGNOSIS — I700 Atherosclerosis of aorta: Secondary | ICD-10-CM | POA: Diagnosis not present

## 2022-09-27 DIAGNOSIS — J479 Bronchiectasis, uncomplicated: Secondary | ICD-10-CM | POA: Diagnosis not present

## 2022-09-27 DIAGNOSIS — R599 Enlarged lymph nodes, unspecified: Secondary | ICD-10-CM | POA: Insufficient documentation

## 2022-09-27 DIAGNOSIS — F17210 Nicotine dependence, cigarettes, uncomplicated: Secondary | ICD-10-CM | POA: Insufficient documentation

## 2022-09-27 DIAGNOSIS — J84112 Idiopathic pulmonary fibrosis: Secondary | ICD-10-CM | POA: Insufficient documentation

## 2022-09-27 DIAGNOSIS — K449 Diaphragmatic hernia without obstruction or gangrene: Secondary | ICD-10-CM | POA: Insufficient documentation

## 2022-09-27 DIAGNOSIS — J849 Interstitial pulmonary disease, unspecified: Secondary | ICD-10-CM

## 2022-09-27 DIAGNOSIS — J439 Emphysema, unspecified: Secondary | ICD-10-CM | POA: Diagnosis not present

## 2022-10-03 ENCOUNTER — Ambulatory Visit (HOSPITAL_COMMUNITY)
Admission: RE | Admit: 2022-10-03 | Discharge: 2022-10-03 | Disposition: A | Discharge status: Home / Self Care | Payer: 59 | Source: Ambulatory Visit | Attending: Pulmonary Disease | Admitting: Pulmonary Disease

## 2022-10-03 DIAGNOSIS — J849 Interstitial pulmonary disease, unspecified: Secondary | ICD-10-CM | POA: Diagnosis not present

## 2022-10-03 DIAGNOSIS — I119 Hypertensive heart disease without heart failure: Secondary | ICD-10-CM | POA: Insufficient documentation

## 2022-10-03 DIAGNOSIS — I071 Rheumatic tricuspid insufficiency: Secondary | ICD-10-CM | POA: Diagnosis not present

## 2022-10-03 DIAGNOSIS — R0602 Shortness of breath: Secondary | ICD-10-CM | POA: Diagnosis not present

## 2022-10-03 DIAGNOSIS — F17200 Nicotine dependence, unspecified, uncomplicated: Secondary | ICD-10-CM | POA: Diagnosis not present

## 2022-10-03 DIAGNOSIS — E785 Hyperlipidemia, unspecified: Secondary | ICD-10-CM | POA: Insufficient documentation

## 2022-10-03 LAB — ECHOCARDIOGRAM COMPLETE
Area-P 1/2: 2.8 cm2
S' Lateral: 2.9 cm

## 2022-10-03 NOTE — Progress Notes (Signed)
  Echocardiogram 2D Echocardiogram has been performed.  Laura [name] 10/03/2022, 11:55 AM

## 2022-10-09 ENCOUNTER — Encounter: Payer: Self-pay | Admitting: Pulmonary Disease

## 2022-10-09 ENCOUNTER — Ambulatory Visit (INDEPENDENT_AMBULATORY_CARE_PROVIDER_SITE_OTHER): Payer: 59 | Admitting: Pulmonary Disease

## 2022-10-09 VITALS — BP 124/66 | HR 76 | Temp 97.9°F | Ht 68.0 in | Wt 202.0 lb

## 2022-10-09 DIAGNOSIS — J849 Interstitial pulmonary disease, unspecified: Secondary | ICD-10-CM | POA: Diagnosis not present

## 2022-10-09 DIAGNOSIS — Z5181 Encounter for therapeutic drug level monitoring: Secondary | ICD-10-CM

## 2022-10-09 NOTE — Patient Instructions (Signed)
Will start you on a medication called Ofev to reduce the rate of scarring in the lung Follow-up in 3 months.

## 2022-10-09 NOTE — Progress Notes (Signed)
Laura [name]    [id number]    August 11, 1945  Primary Care Physician:Masters, Florentina Addison, DO  Referring Physician: Rudene Christians, DO [address] [place],  [place] [address]  Chief complaint: Follow-up for abnormal CT, interstitial lung disease  HPI: 77 y.o. who  has a past medical history of Chronic back pain, Chronic kidney disease, GERD (gastroesophageal reflux disease), High cholesterol, Lymphadenopathy, and Substance abuse.   Referred for evaluation of abnormal imaging on low-dose screening CT which showed pulmonary fibrosis History notable for ongoing smoking, marijuana use.  She used cocaine until 2021  Has mild dyspnea on exertion.  Denies any cough, chest congestion or sputum production.  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  No feather pillows or comforters. Smoking history: 28-pack-year smoker.  Smokes up to 8 cigarettes/day and 1-2 marijuana joints per day Travel history: Previously lived in [place] in [place].  No significant recent travel Relevant family history: Her siblings have unspecified lung issues.  The are smokers.  Interim history: Here for review of CT, echocardiogram and labs States that breathing is doing well with no issues Continues to smoke.  Outpatient Encounter Medications as of 10/09/2022  Medication Sig   acetaminophen (TYLENOL) 500 MG tablet Take 2 tablets (1,000 mg total) by mouth every 8 (eight) hours as needed.   albuterol (VENTOLIN HFA) 108 (90 Base) MCG/ACT inhaler INHALE 1-2 PUFFS BY MOUTH EVERY 6 HOURS AS NEEDED FOR WHEEZE OR SHORTNESS OF BREATH   aspirin 81 MG chewable tablet Chew 81 mg by mouth daily.   diclofenac Sodium (VOLTAREN) 1 % GEL Apply 4 g topically 4 (four) times daily.   fluticasone (FLONASE) 50 MCG/ACT nasal spray USE 1-2 SPRAYS IN EACH NOSTRIL DAILY   gabapentin (NEURONTIN) 600 MG tablet Take 1 tablet (600  mg total) by mouth 4 (four) times daily.   GEMTESA 75 MG TABS Take 1 tablet by mouth daily.   metoprolol succinate (TOPROL-XL) 25 MG 24 hr tablet Take 25 mg by mouth daily.   pantoprazole (PROTONIX) 40 MG tablet TAKE 1 TABLET BY MOUTH EVERY DAY   rosuvastatin (CRESTOR) 10 MG tablet Take 1 tablet (10 mg total) by mouth daily.   amLODipine (NORVASC) 10 MG tablet Take 1 tablet (10 mg total) by mouth daily.   Sodium Chloride-Sodium Bicarb (NETI POT SINUS WASH) 2300-700 MG KIT Place 1 kit into the nose daily. (Patient not taking: Reported on 10/09/2022)   No facility-administered encounter medications on file as of 10/09/2022.    Physical Exam: Blood pressure 124/66, pulse 76, temperature 97.9 F (36.6 C), temperature source Oral, height  (1.727 m), weight 202 lb (91.6 kg), SpO2 100 %. Gen:      No acute distress HEENT:  EOMI, sclera anicteric Neck:     No masses; no thyromegaly Lungs:    Clear to auscultation bilaterally; normal respiratory effort CV:         Regular rate and rhythm; no murmurs Abd:      + bowel sounds; soft, non-tender; no palpable masses, no distension Ext:    No edema; adequate peripheral perfusion Skin:      Warm and dry; no rash Neuro: alert and oriented x 3 Psych: normal mood and affect   Data Reviewed: Imaging: CT chest 12/09/2014-mild emphysema, subcentimeter pulmonary nodule, subtle reticulation at the lung base CT chest 05/09/2022-stable pulmonary nodule, basilar predominant reticular opacities with traction  bronchiectasis appears progressive from 2016. High resolution CT 09/27/2022-mild pulmonary fibrosis and probable UIP pattern, dilated pulmonary artery, emphysema. I have reviewed the images personally.  PFTs: 05/23/2022 FVC 2.60 [80%], FEV1 2.13 [87%], F/F82, TLC 5.98 [108%], DLCO 13.36 [48%] Severe diffusion defect  Labs: CTD serologies 09/08/2022 significant only for ANA 1: 80, cytoplasmic  Cardiac: Echocardiogram 10/03/2022-LVEF 55-60%, grade 1  diastolic dysfunction, normal PA systolic pressure  Assessment:  Pulmonary fibrosis, interstitial lung disease CT shows pulmonary fibrosis in probable UIP pattern.  There are subtle changes back in 2016 and appears to have progressed since then.  There are no significant exposures or symptoms of autoimmune disease.  Titers are low which is likely nonsignificant as she does not have any evidence of lupus  PFTs reviewed with isolated diffusion defect with normal lung volumes and spirometry.  No evidence of pulmonary hypertension on echocardiogram  This is likely to be IPF and as she has progression on CT scan she is a candidate for antifibrotic therapy We discussed treatment options and she has decided to go with Ofev Recent hepatic panel is normal  Plan/Recommendations: Start therapy with Dondra Spry MD [facility] 10/09/2022, 11:30 AM  CC: Rudene Christians, DO   09/27/2022-

## 2022-10-10 ENCOUNTER — Telehealth: Payer: Self-pay | Admitting: Pharmacist

## 2022-10-10 DIAGNOSIS — Z5181 Encounter for therapeutic drug level monitoring: Secondary | ICD-10-CM

## 2022-10-10 DIAGNOSIS — J849 Interstitial pulmonary disease, unspecified: Secondary | ICD-10-CM

## 2022-10-10 NOTE — Telephone Encounter (Signed)
Received new start paperwork for Ofev (no income documents).  Submitted a Prior Authorization request to [facility] for OFEV via CoverMyMeds. Will update once we receive a response.  Key: [id number]  Chesley Mires, PharmD, MPH, BCPS, CPP Clinical Pharmacist (Rheumatology and Pulmonology)

## 2022-10-12 ENCOUNTER — Ambulatory Visit (INDEPENDENT_AMBULATORY_CARE_PROVIDER_SITE_OTHER): Payer: 59 | Admitting: Student

## 2022-10-12 ENCOUNTER — Other Ambulatory Visit (HOSPITAL_COMMUNITY): Payer: Self-pay

## 2022-10-12 VITALS — BP 130/80 | HR 79 | Temp 98.0°F | Ht 68.0 in | Wt 203.0 lb

## 2022-10-12 DIAGNOSIS — J849 Interstitial pulmonary disease, unspecified: Secondary | ICD-10-CM | POA: Diagnosis not present

## 2022-10-12 NOTE — Patient Instructions (Signed)
Please follow up with your pulmonologist regarding IPF and wether or not medication would be right for you.

## 2022-10-12 NOTE — Telephone Encounter (Signed)
Received notification from [facility] regarding a prior authorization for OFEV. Authorization has been APPROVED from 10/10/22 to 06/19/23. Approval letter sent to scan center.  Per test claim, copay for 30 days supply is $0  Patient can fill through [facility]: [phone]   Authorization # [id number] Phone # [phone]  Patient requested call back either tomorrow or early next week. She'd like to discuss further with her PCP today. I advised that I'd like to provide pharmacy phone number and go over medication in detail. Will f/u  Chesley Mires, PharmD, MPH, BCPS, CPP Clinical Pharmacist (Rheumatology and Pulmonology)=

## 2022-10-16 ENCOUNTER — Encounter: Payer: Self-pay | Admitting: Student

## 2022-10-16 MED ORDER — OFEV 150 MG PO CAPS: 150.0000 mg | ORAL_CAPSULE | Freq: Two times a day (BID) | ORAL | 1 refills | Status: DC

## 2022-10-16 NOTE — Assessment & Plan Note (Addendum)
Patient recently referred to pulmonology for evaluation of ILD that was incidentally found on recent CT for lung nodule follow-up.  She endorsed increasing shortness of breath and cough when laying on the right side and had PFTs.  PFTs with moderately severe diffusion defect concerning for pulmonary hypertension.She was evaluated by pulmonology 1 month ago and noted that CT in 2016 with mild changes that have progressed.  High-res CT and echo without signs of pulmonary hypertension on echocardiogram.  Pulmonology started her on antifibrotic therapy for IPF.   Patient reports that her new antifibrotic medication is being mailed to her house. Currently not having any respiratory symptoms.  She denies having significant dyspnea, cough, or other respiratory symptoms that are bothersome or function limiting in the past as well.  She is reluctant to start this new medication given low symptom burden. Was told she seems to have very slowly progressive disease and does not feel she will experience any benefit from additional medication and is very concerned about side effects.  She has not discussed these concerns with her pulmonologist.  She does have a telephone call with [facility] pulmonology office next week.  I counseled her to discuss this with pulmonology and make a follow-up appointment prior to starting medications given her concerns.

## 2022-10-16 NOTE — Progress Notes (Signed)
Established Patient Office Visit  Subjective   Patient ID: Laura [name], female    DOB: 01-Apr-1946  Age: 77 y.o. MRN: [id number]  Chief Complaint  Patient presents with   Back Pain   Knee Pain    Laura [name] is a 77 y.o. person living with a history listed below who presents to clinic for follow up.of ILD. Please refer to problem based charting for further details and assessment and plan of current problem and chronic medical conditions.     Patient Active Problem List   Diagnosis Date Noted   ILD (interstitial lung disease) (HCC) 05/24/2022   Pre-diabetes 05/05/2022   Tension headache 08/03/2021   Multiple pulmonary nodules 03/04/2020   PND (post-nasal drip) 12/09/2019   Encounter for screening mammogram for malignant neoplasm of breast 10/20/2019   Risk for falls 03/11/2018   Open-angle glaucoma 11/15/2017   Cramping of hands 11/15/2017   Plantar wart 08/06/2017   Mass of right ear canal 07/04/2017   Screening for osteoporosis 04/06/2017   Bronchitis due to tobacco use 11/22/2016   Left hand pain 04/16/2014   Mixed incontinence urge and stress 10/06/2013   Chronic cough 08/04/2013   HTN (hypertension) 08/04/2013   Health care maintenance 12/31/2012   Post laminectomy syndrome 01/30/2012   Lumbago 01/30/2012   GERD (gastroesophageal reflux disease) 01/05/2011   HLD (hyperlipidemia) 05/18/2010   DEPRESSION 11/25/2009   Low back pain with radiation 04/16/2009   Tobacco use 03/12/2009   PERIPHERAL NEUROPATHY, LOWER EXTREMITY, LEFT 03/12/2009   Allergic rhinitis 10/09/2007   CARPAL TUNNEL SYNDROME, LEFT 09/12/2007    ROS: negative as per HPI    Objective:     BP 130/80 (BP Location: Left Arm, Patient Position: Sitting, Cuff Size: Small)   Pulse 79   Temp 98 F (36.7 C) (Oral)   Ht 5\' 8"  (1.727 m)   Wt 203 lb (92.1 kg)   SpO2 98%   BMI 30.87 kg/m  BP Readings from Last 3 Encounters:  10/12/22 130/80  10/09/22 124/66  09/07/22 128/76       Physical Exam Constitutional:      Appearance: Normal appearance.     Comments: In a wheelchair  HENT:     Head: Normocephalic and atraumatic.     Mouth/Throat:     Mouth: Mucous membranes are moist.     Pharynx: Oropharynx is clear.  Eyes:     Extraocular Movements: Extraocular movements intact.     Pupils: Pupils are equal, round, and reactive to light.  Cardiovascular:     Rate and Rhythm: Normal rate and regular rhythm.  Pulmonary:     Effort: Pulmonary effort is normal.     Breath sounds: Wheezing (mild) present.     Comments: Fine crackles Abdominal:     General: Abdomen is flat. Bowel sounds are normal. There is no distension.     Palpations: Abdomen is soft.     Tenderness: There is no abdominal tenderness.  Musculoskeletal:        General: Normal range of motion.     Right lower leg: No edema.     Left lower leg: No edema.  Skin:    General: Skin is warm and dry.     Capillary Refill: Capillary refill takes less than 2 seconds.  Neurological:     General: No focal deficit present.     Mental Status: She is alert and oriented to person, place, and time.  Psychiatric:  Mood and Affect: Mood normal.        Behavior: Behavior normal.      No results found for any visits on 10/12/22.  Last metabolic panel Lab Results  Component Value Date   GLUCOSE 114 (H) 10/12/2021   NA 142 10/12/2021   K 4.0 10/12/2021   CL 107 (H) 10/12/2021   CO2 19 (L) 10/12/2021   BUN 14 10/12/2021   CREATININE 0.85 10/12/2021   EGFR 72 10/12/2021   CALCIUM 9.0 10/12/2021   PHOS 3.6 11/15/2017   PROT 7.2 10/12/2021   ALBUMIN 3.5 (L) 10/12/2021   BILITOT 0.3 10/12/2021   ALKPHOS 118 10/12/2021   AST 14 10/12/2021   ALT 13 10/12/2021   ANIONGAP 10 08/11/2020      The ASCVD Risk score (Arnett DK, et al., 2019) failed to calculate for the following reasons:   The valid total cholesterol range is 130 to 320 mg/dL    Assessment & Plan:   Problem List Items Addressed  This Visit       Respiratory   ILD (interstitial lung disease) (HCC) - Primary    Patient recently referred to pulmonology for evaluation of ILD that was incidentally found on recent CT for lung nodule follow-up.  She endorsed increasing shortness of breath and cough when laying on the right side and had PFTs.  PFTs with moderately severe diffusion defect concerning for pulmonary hypertension.She was evaluated by pulmonology 1 month ago and noted that CT in 2016 with mild changes that have progressed.  High-res CT and echo without signs of pulmonary hypertension on echocardiogram.  Pulmonology started her on antifibrotic therapy for IPF.   Patient reports that her new antifibrotic medication is being mailed to her house. Currently not having any respiratory symptoms.  She denies having significant dyspnea, cough, or other respiratory symptoms that are bothersome or function limiting in the past as well.  She is reluctant to start this new medication given low symptom burden. Was told she seems to have very slowly progressive disease and does not feel she will experience any benefit from additional medication and is very concerned about side effects.  She has not discussed these concerns with her pulmonologist.  She does have a telephone call with [facility] pulmonology office next week.  I counseled her to discuss this with pulmonology and make a follow-up appointment prior to starting medications given her concerns.       No follow-ups on file.    Quincy Simmonds, MD

## 2022-10-16 NOTE — Telephone Encounter (Signed)
Patient counseled on purpose, proper use, and potential adverse effects including diarrhea, nausea, vomiting, abdominal pain, decreased appetite, weight loss, and increased blood pressure. Stressed the importance of routine lab monitoring. Will monitor LFT's every month for the first 3 months of treatment then every 3 months.   Ofev dose will be 150 mg capsule every 12 hours with food. Stressed importance of taking with food to minimize stomach upset.  Rx sent to [facility] today. Patient advised to call later this week to schedule shipment to hom. Advised to call pharmacy for refills when she has 7 days of medication left.  Chesley Mires, PharmD, MPH, BCPS, CPP Clinical Pharmacist (Rheumatology and Pulmonology)

## 2022-10-25 NOTE — Addendum Note (Signed)
Addended by: Debe Coder B on: 10/25/2022 10:40 AM   Modules accepted: Level of Service

## 2022-10-25 NOTE — Progress Notes (Signed)
Internal Medicine Clinic Attending  Case discussed with Dr. Liang  at the time of the visit.  We reviewed the resident's history and exam and pertinent patient test results.  I agree with the assessment, diagnosis, and plan of care documented in the resident's note.  

## 2022-11-09 DIAGNOSIS — H401131 Primary open-angle glaucoma, bilateral, mild stage: Secondary | ICD-10-CM | POA: Diagnosis not present

## 2022-11-15 DIAGNOSIS — I10 Essential (primary) hypertension: Secondary | ICD-10-CM | POA: Diagnosis not present

## 2022-11-15 DIAGNOSIS — E782 Mixed hyperlipidemia: Secondary | ICD-10-CM | POA: Diagnosis not present

## 2022-11-15 DIAGNOSIS — J849 Interstitial pulmonary disease, unspecified: Secondary | ICD-10-CM | POA: Diagnosis not present

## 2022-11-15 DIAGNOSIS — K219 Gastro-esophageal reflux disease without esophagitis: Secondary | ICD-10-CM | POA: Diagnosis not present

## 2022-11-22 ENCOUNTER — Other Ambulatory Visit: Payer: Self-pay | Admitting: Internal Medicine

## 2022-11-22 ENCOUNTER — Ambulatory Visit: Payer: 59 | Admitting: Pulmonary Disease

## 2022-11-22 DIAGNOSIS — R0981 Nasal congestion: Secondary | ICD-10-CM

## 2022-12-01 ENCOUNTER — Emergency Department (HOSPITAL_COMMUNITY): Payer: 59

## 2022-12-01 ENCOUNTER — Emergency Department (HOSPITAL_COMMUNITY)
Admission: EM | Admit: 2022-12-01 | Discharge: 2022-12-02 | Disposition: A | Discharge status: Home / Self Care | Payer: 59 | Attending: Student | Admitting: Student

## 2022-12-01 ENCOUNTER — Other Ambulatory Visit: Payer: Self-pay

## 2022-12-01 ENCOUNTER — Encounter (HOSPITAL_COMMUNITY): Payer: Self-pay

## 2022-12-01 DIAGNOSIS — S79912A Unspecified injury of left hip, initial encounter: Secondary | ICD-10-CM | POA: Diagnosis present

## 2022-12-01 DIAGNOSIS — Z043 Encounter for examination and observation following other accident: Secondary | ICD-10-CM | POA: Diagnosis not present

## 2022-12-01 DIAGNOSIS — M25561 Pain in right knee: Secondary | ICD-10-CM

## 2022-12-01 DIAGNOSIS — M545 Low back pain, unspecified: Secondary | ICD-10-CM | POA: Insufficient documentation

## 2022-12-01 DIAGNOSIS — M25552 Pain in left hip: Secondary | ICD-10-CM | POA: Diagnosis not present

## 2022-12-01 DIAGNOSIS — S32592A Other specified fracture of left pubis, initial encounter for closed fracture: Secondary | ICD-10-CM

## 2022-12-01 DIAGNOSIS — Z7982 Long term (current) use of aspirin: Secondary | ICD-10-CM | POA: Diagnosis not present

## 2022-12-01 DIAGNOSIS — M4126 Other idiopathic scoliosis, lumbar region: Secondary | ICD-10-CM | POA: Diagnosis not present

## 2022-12-01 DIAGNOSIS — I10 Essential (primary) hypertension: Secondary | ICD-10-CM | POA: Diagnosis not present

## 2022-12-01 DIAGNOSIS — Y92009 Unspecified place in unspecified non-institutional (private) residence as the place of occurrence of the external cause: Secondary | ICD-10-CM | POA: Diagnosis not present

## 2022-12-01 DIAGNOSIS — W19XXXA Unspecified fall, initial encounter: Secondary | ICD-10-CM | POA: Diagnosis not present

## 2022-12-01 MED ORDER — OXYCODONE-ACETAMINOPHEN 5-325 MG PO TABS
1.0000 | ORAL_TABLET | Freq: Once | ORAL | Status: AC
  Administered 2022-12-01: 1 via ORAL
  Filled 2022-12-01: qty 1

## 2022-12-01 NOTE — Discharge Instructions (Signed)
You were seen in the emergency department following a fall. Your xray imaging was negative for any fractures or dislocations. I would advise managing your pain with Tylenol, Aleve, or Advil as needed. Please follow up with your primary care doctor for further evaluation or return to the ER if symptoms are worsening.

## 2022-12-01 NOTE — ED Triage Notes (Signed)
Pt BIB EMS from home for a fall after tripping over a TV. No LOC or blood thinners. Pt stated she did not hit her head.  Pt c/o Left hip pain and right knee pain  170/94 70 HR 16 RR 99% RA

## 2022-12-01 NOTE — ED Provider Notes (Signed)
[place] EMERGENCY DEPARTMENT AT [facility] Provider Note   CSN: [id number] Arrival date & time: 12/01/22  1923     History Chief Complaint  Patient presents with   Laura [name] is a 77 y.o. female. Patient presents to the ED following a mechanical fall.  She reports that she was at home when she tripped over a TV that was on the ground.  She reports that she is experiencing pain in her left low back left hip, and right knee.  Denies any significant difficulty walking does report some pain with ambulation.  Denies any weakness or numbness in lower extremities.  No prior history of orthopedic procedures in this area.  Does endorse a prior history of osteopenia.   Fall       Home Medications Prior to Admission medications   Medication Sig Start Date End Date Taking? Authorizing Provider  HYDROcodone-acetaminophen (NORCO/VICODIN) 5-325 MG tablet Take 1 tablet by mouth every 4 (four) hours as needed. 12/02/22  Yes Garlon Hatchet, PA-C  acetaminophen (TYLENOL) 500 MG tablet Take 2 tablets (1,000 mg total) by mouth every 8 (eight) hours as needed. 05/17/22 05/17/23  Morene Crocker, MD  albuterol (VENTOLIN HFA) 108 (90 Base) MCG/ACT inhaler INHALE 1-2 PUFFS BY MOUTH EVERY 6 HOURS AS NEEDED FOR WHEEZE OR SHORTNESS OF BREATH 06/15/22   Masters, Florentina Addison, DO  amLODipine (NORVASC) 10 MG tablet Take 1 tablet (10 mg total) by mouth daily. 03/04/20 02/27/21  Roylene Reason, MD  aspirin 81 MG chewable tablet Chew 81 mg by mouth daily.    [provider]  diclofenac Sodium (VOLTAREN) 1 % GEL Apply 4 g topically 4 (four) times daily. 05/17/22   Morene Crocker, MD  fluticasone (FLONASE) 50 MCG/ACT nasal spray USE 1-2 SPRAYS IN [facility] NOSTRIL DAILY 11/22/22   Masters, Florentina Addison, DO  gabapentin (NEURONTIN) 600 MG tablet Take 1 tablet (600 mg total) by mouth 4 (four) times daily. 07/20/22 10/18/22  Masters, Katie, DO  GEMTESA 75 MG TABS Take 1 tablet by mouth daily.  07/24/22   [provider]  metoprolol succinate (TOPROL-XL) 25 MG 24 hr tablet Take 25 mg by mouth daily. 08/14/19   [provider]  Nintedanib (OFEV) 150 MG CAPS Take 1 capsule (150 mg total) by mouth 2 (two) times daily. 10/16/22   Mannam, Colbert Coyer, MD  pantoprazole (PROTONIX) 40 MG tablet TAKE 1 TABLET BY MOUTH EVERY DAY 07/17/22   Masters, Florentina Addison, DO  rosuvastatin (CRESTOR) 10 MG tablet Take 1 tablet (10 mg total) by mouth daily. 02/14/22 02/14/23  Masters, Katie, DO  Sodium Chloride-Sodium Bicarb (NETI POT SINUS WASH) 2300-700 MG KIT Place 1 kit into the nose daily. Patient not taking: Reported on 10/09/2022 10/11/21   Chauncey Mann, DO      Allergies    Patient has no known allergies.    Review of Systems   Review of Systems  Musculoskeletal:  Positive for back pain.  All other systems reviewed and are negative.   Physical Exam Updated Vital Signs BP 104/64 (BP Location: Left Arm)   Pulse 90   Temp 98.3 F (36.8 C) (Oral)   Resp 17   Ht 5\' 8"  (1.727 m)   Wt 91.2 kg   SpO2 93%   BMI 30.56 kg/m  Physical Exam Vitals and nursing note reviewed.  Constitutional:      General: She is not in acute distress.    Appearance: She is well-developed.  HENT:  Head: Normocephalic and atraumatic.  Eyes:     Conjunctiva/sclera: Conjunctivae normal.  Cardiovascular:     Rate and Rhythm: Normal rate and regular rhythm.     Heart sounds: No murmur heard. Pulmonary:     Effort: Pulmonary effort is normal. No respiratory distress.     Breath sounds: Normal breath sounds.  Abdominal:     Palpations: Abdomen is soft.     Tenderness: There is no abdominal tenderness.  Musculoskeletal:        General: Tenderness present. No swelling, deformity or signs of injury. Normal range of motion.     Cervical back: Neck supple.     Comments: Tenderness to palpation along the left hip and left low back. Right knee not tender and ROM intact. Patient unable to stand fully upright but  reportedly her baseline.  Skin:    General: Skin is warm and dry.     Capillary Refill: Capillary refill takes less than 2 seconds.  Neurological:     Mental Status: She is alert.  Psychiatric:        Mood and Affect: Mood normal.     ED Results / Procedures / Treatments   Labs (all labs ordered are listed, but only abnormal results are displayed) Labs Reviewed - No data to display  EKG None  Radiology CT Lumbar Spine Wo Contrast  Result Date: 12/02/2022 CLINICAL DATA:  Fall EXAM: CT LUMBAR SPINE WITHOUT CONTRAST TECHNIQUE: Multidetector CT imaging of the lumbar spine was performed without intravenous contrast administration. Multiplanar CT image reconstructions were also generated. RADIATION DOSE REDUCTION: This exam was performed according to the departmental dose-optimization program which includes automated exposure control, adjustment of the mA and/or kV according to patient size and/or use of iterative reconstruction technique. COMPARISON:  No prior CT of the lumbar spine available, correlation is made with lumbar spine radiographs 12/01/2022 and 08/12/2014 FINDINGS: Segmentation: 5 lumbar type vertebral bodies. The lowest fully formed disc space is labeled L5-S1. Alignment: Levoscoliosis of the lumbar spine, with apex at L3. 3 mm left lateral listhesis of L4 on L5. No significant anterolisthesis or retrolisthesis. Vertebrae: No acute fracture or suspicious osseous lesion. Diffuse endplate changes, greatest at L1-L2, eccentric to the right, and L5-S1, eccentric to the left. Paraspinal and other soft tissues: Left renal cyst, which is incompletely included in the field of view. No lymphadenopathy. Aortic atherosclerosis. Disc levels: T12-L1: Disc height loss and small disc osteophyte complex. Mild left greater than right facet arthropathy. No spinal canal stenosis. Mild left neural foraminal narrowing. L1-L2: Disc height loss and disc osteophyte complex with mild disc bulge. Moderate right  facet arthropathy. Narrowing of the lateral recesses. No spinal canal stenosis. Mild right neural foraminal narrowing. L2-L3: Disc height loss and disc osteophyte complex with moderate calcified disc bulge. Mild right facet arthropathy. No spinal canal stenosis. Moderate right neural foraminal narrowing. L3-L4: Disc height loss with mild disc osteophyte complex, with vacuum disc phenomenon. Mild right facet arthropathy. Narrowing of the lateral recesses. No spinal canal stenosis. Moderate right neural foraminal narrowing. L4-L5: Disc height loss and disc osteophyte complex with mild disc bulge. Severe right and moderate left facet arthropathy, with widening of the right facets, which can indicate instability. Narrowing of the lateral recesses. No spinal canal stenosis. Mild bilateral neural foraminal narrowing. L5-S1: Disc height loss and disc osteophyte complex with moderate disc bulge, with vacuum disc phenomenon. Narrowing of the lateral recesses. Prior decompression. Severe facet arthropathy. No spinal canal stenosis. Mild bilateral neural foraminal narrowing.  IMPRESSION: 1. No acute fracture or traumatic listhesis. 2. Scoliosis with asymmetric degenerative changes, described above. 3. L2-L3 and L3-L4 moderate right neural foraminal narrowing. 4. L5-S1 mild bilateral neural foraminal narrowing. Additional mild left neural foraminal narrowing at T12-L1 and mild right neural foraminal narrowing at L1-L2. 5. Aortic atherosclerosis. Aortic Atherosclerosis (ICD10-I70.0). Electronically Signed   By: Wiliam Ke M.D.   On: 12/02/2022 02:30   CT Hip Left Wo Contrast  Result Date: 12/02/2022 CLINICAL DATA:  Fall EXAM: CT OF THE LEFT HIP WITHOUT CONTRAST TECHNIQUE: Multidetector CT imaging of the left hip was performed according to the standard protocol. Multiplanar CT image reconstructions were also generated. RADIATION DOSE REDUCTION: This exam was performed according to the departmental dose-optimization program  which includes automated exposure control, adjustment of the mA and/or kV according to patient size and/or use of iterative reconstruction technique. COMPARISON:  Plain films today FINDINGS: Bones/Joint/Cartilage Suspect subtle nondisplaced left inferior pubic ramus fracture. No hip fracture, subluxation or dislocation. Mild degenerative changes in the hips bilaterally. Ligaments Suboptimally assessed by CT. Muscles and Tendons Unremarkable Soft tissues Unremarkable IMPRESSION: Suspect subtle nondisplaced left inferior pubic ramus fracture. No hip fracture. Electronically Signed   By: Charlett Nose M.D.   On: 12/02/2022 00:09   DG Knee Complete 4 Views Right  Result Date: 12/01/2022 CLINICAL DATA:  Recent trip and fall with right knee pain, initial encounter EXAM: RIGHT KNEE - COMPLETE 4+ VIEW COMPARISON:  None Available. FINDINGS: Mild degenerative changes are noted in all 3 joint compartments. No acute fracture or dislocation is seen. No joint effusion is noted. IMPRESSION: Degenerative change without acute abnormality. Electronically Signed   By: Alcide Clever M.D.   On: 12/01/2022 21:20   DG Hip Unilat W or Wo Pelvis 2-3 Views Left  Result Date: 12/01/2022 CLINICAL DATA:  Recent trip and fall with left hip pain, initial encounter EXAM: DG HIP (WITH OR WITHOUT PELVIS) 3V LEFT COMPARISON:  None Available. FINDINGS: Pelvic ring is intact. No acute fracture or dislocation is noted. No soft tissue changes are seen. IMPRESSION: No acute abnormality noted. Electronically Signed   By: Alcide Clever M.D.   On: 12/01/2022 21:19   DG Lumbar Spine 2-3 Views  Result Date: 12/01/2022 CLINICAL DATA:  Recent fall with back pain, initial encounter EXAM: LUMBAR SPINE - 2-3 VIEW COMPARISON:  08/12/2014 FINDINGS: Five lumbar type vertebral bodies are well visualized. Mild S-shaped scoliosis of the thoracolumbar spine is noted. Progressive osteophytic changes are seen when compared with the prior exam. No compression  deformity is noted. No anterolisthesis is seen. No soft tissue changes are noted. IMPRESSION: Progressive degenerative change without acute abnormality. Electronically Signed   By: Alcide Clever M.D.   On: 12/01/2022 21:19    Procedures Procedures   Medications Ordered in ED Medications  oxyCODONE-acetaminophen (PERCOCET/ROXICET) 5-325 MG per tablet 1 tablet (1 tablet Oral Given 12/01/22 2331)  HYDROcodone-acetaminophen (NORCO/VICODIN) 5-325 MG per tablet 1 tablet (1 tablet Oral Given 12/02/22 0349)    ED Course/ Medical Decision Making/ A&P                           Medical Decision Making Amount and/or Complexity of Data Reviewed Radiology: ordered.  Risk Prescription drug management.   This patient presents to the ED for concern of fall.  Differential diagnosis includes hip dislocation, femoral neck fracture, knee dislocation, lumbar strain   Imaging Studies ordered:  I ordered imaging studies including x-ray of lumbar  spine, x-ray of left hip, x-ray of right knee. I independently visualized and interpreted imaging which showed no obvious fractures or dislocations, degenerative changes noted I agree with the radiologist interpretation   Problem List / ED Course:  Patient presents the emergency department following a mechanical fall.  She reports that she tripped over a TV in her living room and is not experiencing pain in her low back, left hip, right knee.  Not currently on blood thinners denies any head strike or loss of consciousness.  No per history of any surgeries in these areas.  Patient with imaging.  Patient reported that she is having difficulty ambulating stool await results of imaging before attempting to ambulate patient. Imaging is negative for any fractures dislocations.  Degenerative changes are noted though which is expected given patient's age.  Advised patient of these findings and encouraged patient that she is likely safe for discharge home after able to verify  that she is able to ambulate and bear weight without significant difficulty.  Asked nurse for help having patient's balance and gait assessed. Nurse advised that patient having significant difficulty ambulating. Can bear weight, but pain when attempting to move. Discussed concerns with patient for concern of occult fractures not visualized on xray imaging and that CT imaging would be preferred to assess these. Dose of Percocet also given for pain control. Will order CT imaging and reevaluate. CT hip without obvious hip fracture, but suspect inferior ramus fracture. CT lumbar still pending.  12:30AM Care of Shantina Varns transferred to [facility] and Dr. Elpidio Anis at the end of my shift as the patient will require reassessment once labs/imaging have resulted. Patient presentation, ED course, and plan of care discussed with review of all pertinent labs and imaging. Please see his/her note for further details regarding further ED course and disposition. Plan at time of handoff is await results of CT lumbar before dispo. May need to reassess ambulation as patient previously tried ambulating without any pain medication on board and was unable to walk short distance. Patient leaning towards discharge home instead of admission if unable to walk but may need to discuss this with patient again. This may be altered or completely changed at the discretion of the oncoming team pending results of further workup.   Final Clinical Impression(s) / ED Diagnoses Final diagnoses:  Fall, initial encounter  Closed fracture of multiple pubic rami, left, initial encounter [facility])    Rx / DC Orders ED Discharge Orders          Ordered    HYDROcodone-acetaminophen (NORCO/VICODIN) 5-325 MG tablet  Every 4 hours PRN        12/02/22 0315              Smitty Knudsen, PA-C 12/02/22 1458    Kommor, Wyn Forster, MD 12/02/22 1544

## 2022-12-01 NOTE — ED Provider Notes (Incomplete)
[place] EMERGENCY DEPARTMENT AT [facility] Provider Note   CSN: [id number] Arrival date & time: 12/01/22  1923     History Chief Complaint  Patient presents with  . Fall    Laura [name] is a 77 y.o. female. Patient presents to the ED following a mechanical fall.  She reports that she was at home when she tripped over a TV that was on the ground.  She reports that she is experiencing pain in her left low back left hip, and right knee.  Denies any significant difficulty walking does report some pain with ambulation.  Denies any weakness or numbness in lower extremities.  No prior history of orthopedic procedures in this area.  Does endorse a prior history of osteopenia.   Fall       Home Medications Prior to Admission medications   Medication Sig Start Date End Date Taking? Authorizing Provider  acetaminophen (TYLENOL) 500 MG tablet Take 2 tablets (1,000 mg total) by mouth every 8 (eight) hours as needed. 05/17/22 05/17/23  Morene Crocker, MD  albuterol (VENTOLIN HFA) 108 (90 Base) MCG/ACT inhaler INHALE 1-2 PUFFS BY MOUTH EVERY 6 HOURS AS NEEDED FOR WHEEZE OR SHORTNESS OF BREATH 06/15/22   Masters, Florentina Addison, DO  amLODipine (NORVASC) 10 MG tablet Take 1 tablet (10 mg total) by mouth daily. 03/04/20 02/27/21  Roylene Reason, MD  aspirin 81 MG chewable tablet Chew 81 mg by mouth daily.    [provider]  diclofenac Sodium (VOLTAREN) 1 % GEL Apply 4 g topically 4 (four) times daily. 05/17/22   Morene Crocker, MD  fluticasone (FLONASE) 50 MCG/ACT nasal spray USE 1-2 SPRAYS IN [facility] NOSTRIL DAILY 11/22/22   Masters, Florentina Addison, DO  gabapentin (NEURONTIN) 600 MG tablet Take 1 tablet (600 mg total) by mouth 4 (four) times daily. 07/20/22 10/18/22  Masters, Katie, DO  GEMTESA 75 MG TABS Take 1 tablet by mouth daily. 07/24/22   [provider]  metoprolol succinate (TOPROL-XL) 25 MG 24 hr tablet Take 25 mg by mouth daily. 08/14/19   [provider]   Nintedanib (OFEV) 150 MG CAPS Take 1 capsule (150 mg total) by mouth 2 (two) times daily. 10/16/22   Mannam, Colbert Coyer, MD  pantoprazole (PROTONIX) 40 MG tablet TAKE 1 TABLET BY MOUTH EVERY DAY 07/17/22   Masters, Florentina Addison, DO  rosuvastatin (CRESTOR) 10 MG tablet Take 1 tablet (10 mg total) by mouth daily. 02/14/22 02/14/23  Masters, Katie, DO  Sodium Chloride-Sodium Bicarb (NETI POT SINUS WASH) 2300-700 MG KIT Place 1 kit into the nose daily. Patient not taking: Reported on 10/09/2022 10/11/21   Chauncey Mann, DO      Allergies    Patient has no known allergies.    Review of Systems   Review of Systems  Musculoskeletal:  Positive for back pain.  All other systems reviewed and are negative.   Physical Exam Updated Vital Signs BP (!) 177/88 (BP Location: Left Arm)   Pulse 81   Temp 97.9 F (36.6 C) (Oral)   Resp 18   Ht 5\' 8"  (1.727 m)   Wt 91.2 kg   SpO2 99%   BMI 30.56 kg/m  Physical Exam Vitals and nursing note reviewed.  Constitutional:      General: She is not in acute distress.    Appearance: She is well-developed.  HENT:     Head: Normocephalic and atraumatic.  Eyes:     Conjunctiva/sclera: Conjunctivae normal.  Cardiovascular:     Rate and Rhythm:  Normal rate and regular rhythm.     Heart sounds: No murmur heard. Pulmonary:     Effort: Pulmonary effort is normal. No respiratory distress.     Breath sounds: Normal breath sounds.  Abdominal:     Palpations: Abdomen is soft.     Tenderness: There is no abdominal tenderness.  Musculoskeletal:        General: Tenderness present. No swelling, deformity or signs of injury. Normal range of motion.     Cervical back: Neck supple.  Skin:    General: Skin is warm and dry.     Capillary Refill: Capillary refill takes less than 2 seconds.  Neurological:     Mental Status: She is alert.  Psychiatric:        Mood and Affect: Mood normal.     ED Results / Procedures / Treatments   Labs (all labs ordered are listed, but  only abnormal results are displayed) Labs Reviewed - No data to display  EKG None  Radiology DG Knee Complete 4 Views Right  Result Date: 12/01/2022 CLINICAL DATA:  Recent trip and fall with right knee pain, initial encounter EXAM: RIGHT KNEE - COMPLETE 4+ VIEW COMPARISON:  None Available. FINDINGS: Mild degenerative changes are noted in all 3 joint compartments. No acute fracture or dislocation is seen. No joint effusion is noted. IMPRESSION: Degenerative change without acute abnormality. Electronically Signed   By: Alcide Clever M.D.   On: 12/01/2022 21:20   DG Hip Unilat W or Wo Pelvis 2-3 Views Left  Result Date: 12/01/2022 CLINICAL DATA:  Recent trip and fall with left hip pain, initial encounter EXAM: DG HIP (WITH OR WITHOUT PELVIS) 3V LEFT COMPARISON:  None Available. FINDINGS: Pelvic ring is intact. No acute fracture or dislocation is noted. No soft tissue changes are seen. IMPRESSION: No acute abnormality noted. Electronically Signed   By: Alcide Clever M.D.   On: 12/01/2022 21:19   DG Lumbar Spine 2-3 Views  Result Date: 12/01/2022 CLINICAL DATA:  Recent fall with back pain, initial encounter EXAM: LUMBAR SPINE - 2-3 VIEW COMPARISON:  08/12/2014 FINDINGS: Five lumbar type vertebral bodies are well visualized. Mild S-shaped scoliosis of the thoracolumbar spine is noted. Progressive osteophytic changes are seen when compared with the prior exam. No compression deformity is noted. No anterolisthesis is seen. No soft tissue changes are noted. IMPRESSION: Progressive degenerative change without acute abnormality. Electronically Signed   By: Alcide Clever M.D.   On: 12/01/2022 21:19    Procedures Procedures   Medications Ordered in ED Medications - No data to display  ED Course/ Medical Decision Making/ A&P                           Medical Decision Making Amount and/or Complexity of Data Reviewed Radiology: ordered.   This patient presents to the ED for concern of fall.   Differential diagnosis includes hip dislocation, femoral neck fracture, knee dislocation, lumbar strain   Imaging Studies ordered:  I ordered imaging studies including x-ray of lumbar spine, x-ray of left hip, x-ray of right knee. I independently visualized and interpreted imaging which showed no obvious fractures or dislocations, degenerative changes noted I agree with the radiologist interpretation   Problem List / ED Course:  Patient presents the emergency department following a mechanical fall.  She reports that she tripped over a TV in her living room and is not experiencing pain in her low back, left hip, right knee.  Not currently on blood thinners denies any head strike or loss of consciousness.  No per history of any surgeries in these areas.  Patient with imaging.  Patient reported that she is having difficulty ambulating stool await results of imaging before attempting to ambulate patient. Imaging is negative for any fractures dislocations.  Degenerative changes are noted though which is expected given patient's age.  Advised patient of these findings and encouraged patient that she is likely safe for discharge home after able to verify that she is able to ambulate and bear weight without significant difficulty.  Last nurse for help having patient's balance and gait assessed. Patient able to tolerate ambulation and bearing weight without significant difficulty.  Will plan on discharging home.  Patient is agreeable to treatment plan verbalized understanding return precautions.  Advised patient to take over-the-counter pain medication such as Tylenol, Aleve, ibuprofen.  All questions answered prior to patient discharge.  Final Clinical Impression(s) / ED Diagnoses Final diagnoses:  None    Rx / DC Orders ED Discharge Orders     None

## 2022-12-02 DIAGNOSIS — Z7401 Bed confinement status: Secondary | ICD-10-CM | POA: Diagnosis not present

## 2022-12-02 DIAGNOSIS — S32592A Other specified fracture of left pubis, initial encounter for closed fracture: Secondary | ICD-10-CM | POA: Diagnosis not present

## 2022-12-02 DIAGNOSIS — R531 Weakness: Secondary | ICD-10-CM | POA: Diagnosis not present

## 2022-12-02 DIAGNOSIS — Z743 Need for continuous supervision: Secondary | ICD-10-CM | POA: Diagnosis not present

## 2022-12-02 MED ORDER — HYDROCODONE-ACETAMINOPHEN 5-325 MG PO TABS: 1.0000 | ORAL_TABLET | ORAL | 0 refills | Status: DC | PRN

## 2022-12-02 MED ORDER — HYDROCODONE-ACETAMINOPHEN 5-325 MG PO TABS
1.0000 | ORAL_TABLET | Freq: Once | ORAL | Status: AC
  Administered 2022-12-02: 1 via ORAL
  Filled 2022-12-02: qty 1

## 2022-12-02 NOTE — ED Notes (Signed)
[facility] called for transportation  

## 2022-12-02 NOTE — ED Provider Notes (Signed)
Results for orders placed or performed during the hospital encounter of 10/03/22  ECHOCARDIOGRAM COMPLETE  Result Value Ref Range   S' Lateral 2.90 cm   Area-P 1/2 2.80 cm2   Est EF 55 - 60%    CT Lumbar Spine Wo Contrast  Result Date: 12/02/2022 CLINICAL DATA:  Fall EXAM: CT LUMBAR SPINE WITHOUT CONTRAST TECHNIQUE: Multidetector CT imaging of the lumbar spine was performed without intravenous contrast administration. Multiplanar CT image reconstructions were also generated. RADIATION DOSE REDUCTION: This exam was performed according to the departmental dose-optimization program which includes automated exposure control, adjustment of the mA and/or kV according to patient size and/or use of iterative reconstruction technique. COMPARISON:  No prior CT of the lumbar spine available, correlation is made with lumbar spine radiographs 12/01/2022 and 08/12/2014 FINDINGS: Segmentation: 5 lumbar type vertebral bodies. The lowest fully formed disc space is labeled L5-S1. Alignment: Levoscoliosis of the lumbar spine, with apex at L3. 3 mm left lateral listhesis of L4 on L5. No significant anterolisthesis or retrolisthesis. Vertebrae: No acute fracture or suspicious osseous lesion. Diffuse endplate changes, greatest at L1-L2, eccentric to the right, and L5-S1, eccentric to the left. Paraspinal and other soft tissues: Left renal cyst, which is incompletely included in the field of view. No lymphadenopathy. Aortic atherosclerosis. Disc levels: T12-L1: Disc height loss and small disc osteophyte complex. Mild left greater than right facet arthropathy. No spinal canal stenosis. Mild left neural foraminal narrowing. L1-L2: Disc height loss and disc osteophyte complex with mild disc bulge. Moderate right facet arthropathy. Narrowing of the lateral recesses. No spinal canal stenosis. Mild right neural foraminal narrowing. L2-L3: Disc height loss and disc osteophyte complex with moderate calcified disc bulge. Mild right  facet arthropathy. No spinal canal stenosis. Moderate right neural foraminal narrowing. L3-L4: Disc height loss with mild disc osteophyte complex, with vacuum disc phenomenon. Mild right facet arthropathy. Narrowing of the lateral recesses. No spinal canal stenosis. Moderate right neural foraminal narrowing. L4-L5: Disc height loss and disc osteophyte complex with mild disc bulge. Severe right and moderate left facet arthropathy, with widening of the right facets, which can indicate instability. Narrowing of the lateral recesses. No spinal canal stenosis. Mild bilateral neural foraminal narrowing. L5-S1: Disc height loss and disc osteophyte complex with moderate disc bulge, with vacuum disc phenomenon. Narrowing of the lateral recesses. Prior decompression. Severe facet arthropathy. No spinal canal stenosis. Mild bilateral neural foraminal narrowing. IMPRESSION: 1. No acute fracture or traumatic listhesis. 2. Scoliosis with asymmetric degenerative changes, described above. 3. L2-L3 and L3-L4 moderate right neural foraminal narrowing. 4. L5-S1 mild bilateral neural foraminal narrowing. Additional mild left neural foraminal narrowing at T12-L1 and mild right neural foraminal narrowing at L1-L2. 5. Aortic atherosclerosis. Aortic Atherosclerosis (ICD10-I70.0). Electronically Signed   By: Wiliam Ke M.D.   On: 12/02/2022 02:30   CT Hip Left Wo Contrast  Result Date: 12/02/2022 CLINICAL DATA:  Fall EXAM: CT OF THE LEFT HIP WITHOUT CONTRAST TECHNIQUE: Multidetector CT imaging of the left hip was performed according to the standard protocol. Multiplanar CT image reconstructions were also generated. RADIATION DOSE REDUCTION: This exam was performed according to the departmental dose-optimization program which includes automated exposure control, adjustment of the mA and/or kV according to patient size and/or use of iterative reconstruction technique. COMPARISON:  Plain films today FINDINGS: Bones/Joint/Cartilage  Suspect subtle nondisplaced left inferior pubic ramus fracture. No hip fracture, subluxation or dislocation. Mild degenerative changes in the hips bilaterally. Ligaments Suboptimally assessed by CT. Muscles and Tendons Unremarkable Soft  tissues Unremarkable IMPRESSION: Suspect subtle nondisplaced left inferior pubic ramus fracture. No hip fracture. Electronically Signed   By: Charlett Nose M.D.   On: 12/02/2022 00:09   DG Knee Complete 4 Views Right  Result Date: 12/01/2022 CLINICAL DATA:  Recent trip and fall with right knee pain, initial encounter EXAM: RIGHT KNEE - COMPLETE 4+ VIEW COMPARISON:  None Available. FINDINGS: Mild degenerative changes are noted in all 3 joint compartments. No acute fracture or dislocation is seen. No joint effusion is noted. IMPRESSION: Degenerative change without acute abnormality. Electronically Signed   By: Alcide Clever M.D.   On: 12/01/2022 21:20   DG Hip Unilat W or Wo Pelvis 2-3 Views Left  Result Date: 12/01/2022 CLINICAL DATA:  Recent trip and fall with left hip pain, initial encounter EXAM: DG HIP (WITH OR WITHOUT PELVIS) 3V LEFT COMPARISON:  None Available. FINDINGS: Pelvic ring is intact. No acute fracture or dislocation is noted. No soft tissue changes are seen. IMPRESSION: No acute abnormality noted. Electronically Signed   By: Alcide Clever M.D.   On: 12/01/2022 21:19   DG Lumbar Spine 2-3 Views  Result Date: 12/01/2022 CLINICAL DATA:  Recent fall with back pain, initial encounter EXAM: LUMBAR SPINE - 2-3 VIEW COMPARISON:  08/12/2014 FINDINGS: Five lumbar type vertebral bodies are well visualized. Mild S-shaped scoliosis of the thoracolumbar spine is noted. Progressive osteophytic changes are seen when compared with the prior exam. No compression deformity is noted. No anterolisthesis is seen. No soft tissue changes are noted. IMPRESSION: Progressive degenerative change without acute abnormality. Electronically Signed   By: Alcide Clever M.D.   On: 12/01/2022  21:19    CT's completed-- does appear to have subtle left inferior pubic rami fracture.  Lumbar CT without acute findings.  Patient reports she is feeling better since taking the pain medication.  We discussed her results, she is still strongly desiring discharge.  As this is nonoperative injury, she has home health care aide, and cane/walker available to her at home I think this is reasonable.  I did question if she felt like she would need at home physical therapy and she did not feel that was necessary.  Will plan to discharge home with pain control and close PCP follow-up.  She was encouraged to return here for any new or acute changes.   Garlon Hatchet, PA-C 12/02/22 0329    Mardene Sayer, MD 12/02/22 [phone]

## 2022-12-04 ENCOUNTER — Telehealth: Payer: Self-pay | Admitting: *Deleted

## 2022-12-04 NOTE — Telephone Encounter (Signed)
Received call from pt who stated she felled at home on Friday, EMS was called and she was taken to [facility] ED. Stated CT was done; dx with fx pelvis. Stated she was told at discharge to f/u with her PCP, see an Ortho doctor. Stated she was unable to walk so [facility] was called; and she's still unable to walk. Stated she's having difficulty using a walker. Telehelath appt schedule, first available, on Thurs 6/20 w/Dr Courtney Paris for Ortho referral and f/u.

## 2022-12-07 ENCOUNTER — Ambulatory Visit (INDEPENDENT_AMBULATORY_CARE_PROVIDER_SITE_OTHER): Payer: 59 | Admitting: Student

## 2022-12-07 DIAGNOSIS — M25552 Pain in left hip: Secondary | ICD-10-CM | POA: Diagnosis not present

## 2022-12-07 DIAGNOSIS — S32592D Other specified fracture of left pubis, subsequent encounter for fracture with routine healing: Secondary | ICD-10-CM

## 2022-12-07 DIAGNOSIS — M25561 Pain in right knee: Secondary | ICD-10-CM | POA: Diagnosis not present

## 2022-12-07 DIAGNOSIS — M1711 Unilateral primary osteoarthritis, right knee: Secondary | ICD-10-CM | POA: Diagnosis not present

## 2022-12-07 NOTE — Progress Notes (Signed)
  [facility] Internal Medicine Residency Telephone Encounter Continuity Care Appointment  HPI:  This telephone encounter was created for Ms. GEORGANN ESCARSEGA on 12/07/2022 for the following purpose/cc pubic rami fracture.   Past Medical History:  Past Medical History:  Diagnosis Date   Chronic back pain    "mostly lower; left foot is numb all the time" (11/22/2016)   Chronic kidney disease    ?renal cyst on MRI    GERD (gastroesophageal reflux disease)    High cholesterol    Lymphadenopathy    documented on chest CT scan 12/15/2005   Substance abuse (HCC)    tobacco     ROS:     Assessment / Plan / Recommendations:  Please see A&P under problem oriented charting for assessment of the patient's acute and chronic medical conditions.  As always, pt is advised that if symptoms worsen or new symptoms arise, they should go to an urgent care facility or to to ER for further evaluation.   Consent and Medical Decision Making:  Patient discussed with Dr.  Sol Blazing This is a telephone encounter between Meryl Crutch and Rocky Morel on 12/07/2022 for fracture of left pubic ramus. The visit was conducted with the patient located at home and Rocky Morel at [facility]. The patient's identity was confirmed using their DOB and current address. The patient has consented to being evaluated through a telephone encounter and understands the associated risks (an examination cannot be done and the patient may need to come in for an appointment) / benefits (allows the patient to remain at home, decreasing exposure to coronavirus). I personally spent 15 minutes on medical discussion.

## 2022-12-11 DIAGNOSIS — S32592D Other specified fracture of left pubis, subsequent encounter for fracture with routine healing: Secondary | ICD-10-CM | POA: Insufficient documentation

## 2022-12-11 NOTE — Assessment & Plan Note (Signed)
Seen in the ED on 12/01/2022 after a fall and found to have subtle left inferior pubic rami fracture.  She was discharged from the ED and since then has been unable to walk.  She called and got an appointment with orthopedics this morning.  They fitted her with a right knee brace, or setting up home health PT, and prescribed tramadol.  She will follow-up with them again on 12/28/2022.  We will continue to be available for any other needs.

## 2022-12-14 NOTE — Addendum Note (Signed)
Addended by: Dickie La on: 12/14/2022 01:08 PM   Modules accepted: Level of Service

## 2022-12-14 NOTE — Progress Notes (Signed)
Internal Medicine Clinic Attending  Case discussed with Dr. Goodwin  At the time of the visit.  We reviewed the resident's history and exam and pertinent patient test results.  I agree with the assessment, diagnosis, and plan of care documented in the resident's note.  

## 2022-12-18 ENCOUNTER — Telehealth: Payer: Self-pay

## 2022-12-18 DIAGNOSIS — Z9181 History of falling: Secondary | ICD-10-CM | POA: Diagnosis not present

## 2022-12-18 DIAGNOSIS — Z79899 Other long term (current) drug therapy: Secondary | ICD-10-CM | POA: Diagnosis not present

## 2022-12-18 DIAGNOSIS — K5900 Constipation, unspecified: Secondary | ICD-10-CM | POA: Diagnosis not present

## 2022-12-18 DIAGNOSIS — F17200 Nicotine dependence, unspecified, uncomplicated: Secondary | ICD-10-CM | POA: Diagnosis not present

## 2022-12-18 DIAGNOSIS — I129 Hypertensive chronic kidney disease with stage 1 through stage 4 chronic kidney disease, or unspecified chronic kidney disease: Secondary | ICD-10-CM | POA: Diagnosis not present

## 2022-12-18 DIAGNOSIS — S39012D Strain of muscle, fascia and tendon of lower back, subsequent encounter: Secondary | ICD-10-CM | POA: Diagnosis not present

## 2022-12-18 DIAGNOSIS — Z556 Problems related to health literacy: Secondary | ICD-10-CM | POA: Diagnosis not present

## 2022-12-18 DIAGNOSIS — S32592D Other specified fracture of left pubis, subsequent encounter for fracture with routine healing: Secondary | ICD-10-CM | POA: Diagnosis not present

## 2022-12-18 DIAGNOSIS — Z7982 Long term (current) use of aspirin: Secondary | ICD-10-CM | POA: Diagnosis not present

## 2022-12-18 DIAGNOSIS — K219 Gastro-esophageal reflux disease without esophagitis: Secondary | ICD-10-CM | POA: Diagnosis not present

## 2022-12-18 DIAGNOSIS — Z604 Social exclusion and rejection: Secondary | ICD-10-CM | POA: Diagnosis not present

## 2022-12-18 DIAGNOSIS — G8929 Other chronic pain: Secondary | ICD-10-CM | POA: Diagnosis not present

## 2022-12-18 DIAGNOSIS — K2970 Gastritis, unspecified, without bleeding: Secondary | ICD-10-CM | POA: Diagnosis not present

## 2022-12-18 DIAGNOSIS — M47816 Spondylosis without myelopathy or radiculopathy, lumbar region: Secondary | ICD-10-CM | POA: Diagnosis not present

## 2022-12-18 DIAGNOSIS — E7800 Pure hypercholesterolemia, unspecified: Secondary | ICD-10-CM | POA: Diagnosis not present

## 2022-12-18 DIAGNOSIS — N189 Chronic kidney disease, unspecified: Secondary | ICD-10-CM | POA: Diagnosis not present

## 2022-12-18 DIAGNOSIS — S86811D Strain of other muscle(s) and tendon(s) at lower leg level, right leg, subsequent encounter: Secondary | ICD-10-CM | POA: Diagnosis not present

## 2022-12-18 NOTE — Telephone Encounter (Signed)
Pt is returning a call back .Marland Kitchen She is very upset  she is wanting assist  to get (Nintedanib (OFEV) 150 MG CAPS ) to be stopped .Marland Kitchen She stated that she has reached out to the Dr  that gave her the Rx  and has requested the med be stopped but the Pharmacy which is an mail order  keeps  calling her  for the drug  and they are charging her  over $1500 for a 30 day supply

## 2022-12-19 ENCOUNTER — Encounter: Payer: 59 | Admitting: Internal Medicine

## 2022-12-20 NOTE — Telephone Encounter (Signed)
Called and set another appt pt missed  she will be in on 7/15

## 2022-12-22 NOTE — Telephone Encounter (Signed)
Thanks for the update. I will ask Chesley Mires and our pharmacy team to look into Ofev pricing and patient assistance

## 2022-12-25 ENCOUNTER — Telehealth: Payer: Self-pay

## 2022-12-25 DIAGNOSIS — E7800 Pure hypercholesterolemia, unspecified: Secondary | ICD-10-CM | POA: Diagnosis not present

## 2022-12-25 DIAGNOSIS — G8929 Other chronic pain: Secondary | ICD-10-CM | POA: Diagnosis not present

## 2022-12-25 DIAGNOSIS — Z556 Problems related to health literacy: Secondary | ICD-10-CM | POA: Diagnosis not present

## 2022-12-25 DIAGNOSIS — Z7982 Long term (current) use of aspirin: Secondary | ICD-10-CM | POA: Diagnosis not present

## 2022-12-25 DIAGNOSIS — M47816 Spondylosis without myelopathy or radiculopathy, lumbar region: Secondary | ICD-10-CM | POA: Diagnosis not present

## 2022-12-25 DIAGNOSIS — Z79899 Other long term (current) drug therapy: Secondary | ICD-10-CM | POA: Diagnosis not present

## 2022-12-25 DIAGNOSIS — Z9181 History of falling: Secondary | ICD-10-CM | POA: Diagnosis not present

## 2022-12-25 DIAGNOSIS — Z604 Social exclusion and rejection: Secondary | ICD-10-CM | POA: Diagnosis not present

## 2022-12-25 DIAGNOSIS — N189 Chronic kidney disease, unspecified: Secondary | ICD-10-CM | POA: Diagnosis not present

## 2022-12-25 DIAGNOSIS — S86811D Strain of other muscle(s) and tendon(s) at lower leg level, right leg, subsequent encounter: Secondary | ICD-10-CM | POA: Diagnosis not present

## 2022-12-25 DIAGNOSIS — K219 Gastro-esophageal reflux disease without esophagitis: Secondary | ICD-10-CM | POA: Diagnosis not present

## 2022-12-25 DIAGNOSIS — S32592D Other specified fracture of left pubis, subsequent encounter for fracture with routine healing: Secondary | ICD-10-CM | POA: Diagnosis not present

## 2022-12-25 DIAGNOSIS — S39012D Strain of muscle, fascia and tendon of lower back, subsequent encounter: Secondary | ICD-10-CM | POA: Diagnosis not present

## 2022-12-25 DIAGNOSIS — F17200 Nicotine dependence, unspecified, uncomplicated: Secondary | ICD-10-CM | POA: Diagnosis not present

## 2022-12-25 DIAGNOSIS — K2970 Gastritis, unspecified, without bleeding: Secondary | ICD-10-CM | POA: Diagnosis not present

## 2022-12-25 DIAGNOSIS — K5900 Constipation, unspecified: Secondary | ICD-10-CM | POA: Diagnosis not present

## 2022-12-25 DIAGNOSIS — I129 Hypertensive chronic kidney disease with stage 1 through stage 4 chronic kidney disease, or unspecified chronic kidney disease: Secondary | ICD-10-CM | POA: Diagnosis not present

## 2022-12-25 NOTE — Telephone Encounter (Signed)
Received fax from [facility] stating that they have been unable to contact pt in order to refill Ofev. Reached out to pt who informs me that she has told multiple people (nurse with [facility], two people here at "our clinic" and two reps from [facility]) that she no longer wants to take this medication.   She states that she never had any problem with her breathing and was only sent over to our clinic to have the scarring on her lungs "looked at". She expresses her aversion to the laundry list of side effects associated with the medication as well as the quality of life changes associated with ongoing treatment such as regularly checking blood pressure, routine labwork, 3-month appointments, etc. She seemed especially concerned about the warning of "if capsule bursts and gets in contact with skin, wash hands immediately". She goes on to state that she does not want to take ANY medication that costs $11k (despite my reminder that it is free through her insurance), especially when the side effects will likely make her feel worse than she currently does.   Pt expressed her frustration that the pharmacy keeps calling and faxing her and that no one from our office has contacted the pharmacy to d/c the medication (per chart review it appears that pt had actually reached out to her PCP with internal medicine rather than contacting us--unfortunately the key points of the pt's complaints had become misconstrued in the documentation and routing process). I reassured the pt I would document her wishes exactly as she had stated and then contact the pharmacy on her behalf to request that the medication be discontinued.  [facility] pharm has been contacted and medication has been discontinued. Routing encounter to Dr. Isaiah Serge as an Lorain Childes, however nothing further is required at this time.

## 2022-12-25 NOTE — Telephone Encounter (Signed)
This has been addressed by Laura [name] in separate telephone encounter. Patient's Ofev rx has been discontinued at hte pharmacy  Chesley Mires, PharmD, MPH, BCPS, CPP Clinical Pharmacist (Rheumatology and Pulmonology)

## 2022-12-27 DIAGNOSIS — Z604 Social exclusion and rejection: Secondary | ICD-10-CM | POA: Diagnosis not present

## 2022-12-27 DIAGNOSIS — S32592D Other specified fracture of left pubis, subsequent encounter for fracture with routine healing: Secondary | ICD-10-CM | POA: Diagnosis not present

## 2022-12-27 DIAGNOSIS — K5900 Constipation, unspecified: Secondary | ICD-10-CM | POA: Diagnosis not present

## 2022-12-27 DIAGNOSIS — N189 Chronic kidney disease, unspecified: Secondary | ICD-10-CM | POA: Diagnosis not present

## 2022-12-27 DIAGNOSIS — S86811D Strain of other muscle(s) and tendon(s) at lower leg level, right leg, subsequent encounter: Secondary | ICD-10-CM | POA: Diagnosis not present

## 2022-12-27 DIAGNOSIS — Z7982 Long term (current) use of aspirin: Secondary | ICD-10-CM | POA: Diagnosis not present

## 2022-12-27 DIAGNOSIS — S39012D Strain of muscle, fascia and tendon of lower back, subsequent encounter: Secondary | ICD-10-CM | POA: Diagnosis not present

## 2022-12-27 DIAGNOSIS — Z556 Problems related to health literacy: Secondary | ICD-10-CM | POA: Diagnosis not present

## 2022-12-27 DIAGNOSIS — I129 Hypertensive chronic kidney disease with stage 1 through stage 4 chronic kidney disease, or unspecified chronic kidney disease: Secondary | ICD-10-CM | POA: Diagnosis not present

## 2022-12-27 DIAGNOSIS — Z9181 History of falling: Secondary | ICD-10-CM | POA: Diagnosis not present

## 2022-12-27 DIAGNOSIS — K2970 Gastritis, unspecified, without bleeding: Secondary | ICD-10-CM | POA: Diagnosis not present

## 2022-12-27 DIAGNOSIS — G8929 Other chronic pain: Secondary | ICD-10-CM | POA: Diagnosis not present

## 2022-12-27 DIAGNOSIS — Z79899 Other long term (current) drug therapy: Secondary | ICD-10-CM | POA: Diagnosis not present

## 2022-12-27 DIAGNOSIS — F17200 Nicotine dependence, unspecified, uncomplicated: Secondary | ICD-10-CM | POA: Diagnosis not present

## 2022-12-27 DIAGNOSIS — E7800 Pure hypercholesterolemia, unspecified: Secondary | ICD-10-CM | POA: Diagnosis not present

## 2022-12-27 DIAGNOSIS — K219 Gastro-esophageal reflux disease without esophagitis: Secondary | ICD-10-CM | POA: Diagnosis not present

## 2022-12-27 DIAGNOSIS — M47816 Spondylosis without myelopathy or radiculopathy, lumbar region: Secondary | ICD-10-CM | POA: Diagnosis not present

## 2022-12-28 ENCOUNTER — Other Ambulatory Visit: Payer: Self-pay

## 2022-12-28 DIAGNOSIS — M25552 Pain in left hip: Secondary | ICD-10-CM | POA: Diagnosis not present

## 2022-12-28 DIAGNOSIS — M25511 Pain in right shoulder: Secondary | ICD-10-CM | POA: Diagnosis not present

## 2022-12-28 DIAGNOSIS — M961 Postlaminectomy syndrome, not elsewhere classified: Secondary | ICD-10-CM

## 2022-12-29 MED ORDER — GABAPENTIN 600 MG PO TABS
600.0000 mg | ORAL_TABLET | Freq: Four times a day (QID) | ORAL | 3 refills | Status: DC
Start: 2022-12-29 — End: 2023-06-21

## 2023-01-01 ENCOUNTER — Ambulatory Visit (INDEPENDENT_AMBULATORY_CARE_PROVIDER_SITE_OTHER): Payer: 59 | Admitting: Internal Medicine

## 2023-01-01 VITALS — BP 120/84 | HR 86 | Temp 98.2°F | Ht 68.0 in | Wt 203.8 lb

## 2023-01-01 DIAGNOSIS — J84112 Idiopathic pulmonary fibrosis: Secondary | ICD-10-CM

## 2023-01-01 DIAGNOSIS — Z87891 Personal history of nicotine dependence: Secondary | ICD-10-CM | POA: Diagnosis not present

## 2023-01-01 NOTE — Patient Instructions (Addendum)
Thank you, [name] for allowing [place] to provide your care today.   I am glad you are doing better after your fall.   I understand your concern about the new medication for your lungs. This medication is to prevent further scarring of your lungs. I will contact Dr. Shirlee More office to make sure [name] is stopped. I do think talking with him again about this may be beneficial if you are interested.  I think the best thing you can do for your lungs right now is to stop smoking. There are medications that can help decrease craving for tobacco such as chantix and nicotine patch. If you become interested in stopping smoking and want to try these, please let me know.   I have included information about shingrix which is vaccine for shingles. I would recommend you going to pharmacy to complete this vaccine.  I have ordered the following medication/changed the following medications:   Stop the following medications: Medications Discontinued During This Encounter  Medication Reason   Nintedanib (OFEV) 150 MG CAPS      Start the following medications: No orders of the defined types were placed in this encounter.    Follow up: 3 months   We look forward to seeing you next time. Please call our clinic at [phone] if you have any questions or concerns. The best time to call is Monday-Friday from 9am-4pm, but there is someone available 24/7. If after hours or the weekend, call the main hospital number and ask for the Internal Medicine Resident On-Call. If you need medication refills, please notify your pharmacy one week in advance and they will send [place] a request.   Thank you for trusting me with your care. Wishing you the best!   Rudene Christians, DO [facility]

## 2023-01-01 NOTE — Progress Notes (Unsigned)
Subjective:  CC: IPF medication  HPI:  [name] is a 77 y.o. female with a past medical history stated below and presents today to talk about new medication she has been prescribed for Idiopathic Pulmonary Fibrosis. Please see problem based assessment and plan for additional details.  Past Medical History:  Diagnosis Date   Chronic back pain    "mostly lower; left foot is numb all the time" (11/22/2016)   Chronic kidney disease    ?renal cyst on MRI    GERD (gastroesophageal reflux disease)    High cholesterol    IPF (idiopathic pulmonary fibrosis) (HCC)    Lymphadenopathy    documented on chest CT scan 12/15/2005   Substance abuse (HCC)    tobacco    Current Outpatient Medications on File Prior to Visit  Medication Sig Dispense Refill   acetaminophen (TYLENOL) 500 MG tablet Take 2 tablets (1,000 mg total) by mouth every 8 (eight) hours as needed. 100 tablet 2   albuterol (VENTOLIN HFA) 108 (90 Base) MCG/ACT inhaler INHALE 1-2 PUFFS BY MOUTH EVERY 6 HOURS AS NEEDED FOR WHEEZE OR SHORTNESS OF BREATH 8.5 each 2   amLODipine (NORVASC) 10 MG tablet Take 1 tablet (10 mg total) by mouth daily. 90 tablet 6   aspirin 81 MG chewable tablet Chew 81 mg by mouth daily.     diclofenac Sodium (VOLTAREN) 1 % GEL Apply 4 g topically 4 (four) times daily. 4 g 3   fluticasone (FLONASE) 50 MCG/ACT nasal spray USE 1-2 SPRAYS IN EACH NOSTRIL DAILY 48 mL 3   gabapentin (NEURONTIN) 600 MG tablet Take 1 tablet (600 mg total) by mouth 4 (four) times daily. 120 tablet 3   GEMTESA 75 MG TABS Take 1 tablet by mouth daily.     HYDROcodone-acetaminophen (NORCO/VICODIN) 5-325 MG tablet Take 1 tablet by mouth every 4 (four) hours as needed. 15 tablet 0   metoprolol succinate (TOPROL-XL) 25 MG 24 hr tablet Take 25 mg by mouth daily.     pantoprazole (PROTONIX) 40 MG tablet TAKE 1 TABLET BY MOUTH EVERY DAY 90 tablet 2   rosuvastatin (CRESTOR) 10 MG tablet Take 1 tablet (10 mg total) by mouth daily. 90  tablet 3   Sodium Chloride-Sodium Bicarb (NETI POT SINUS WASH) 2300-700 MG KIT Place 1 kit into the nose daily. (Patient not taking: Reported on 10/09/2022) 1 kit 0   No current facility-administered medications on file prior to visit.    Family History  Problem Relation Age of Onset   Diabetes Father    Hypertension Father     Social History   Socioeconomic History   Marital status: Divorced    Spouse name: Not on file   Number of children: 0   Years of education: Not on file   Highest education level: Not on file  Occupational History    Employer: UNEMPLOYED  Tobacco Use   Smoking status: Former    Current packs/day: 0.00    Average packs/day: 0.5 packs/day for 47.0 years (23.5 ttl pk-yrs)    Types: Cigarettes    Start date: 05/13/1973    Quit date: 05/13/2020    Years since quitting: 2.6   Smokeless tobacco: Never   Tobacco comments:    stopped around Thanksgiving   Vaping Use   Vaping status: Never Used  Substance and Sexual Activity   Alcohol use: Yes    Alcohol/week: 0.0 standard drinks of alcohol    Comment: 11/22/2016 "2 drinks q other holiday"  Drug use: Not Currently    Types: Marijuana    Comment: 66/2018 "I stopped in the 1970s"   Sexual activity: Yes  Other Topics Concern   Not on file  Social History Narrative   Patient's new Phone # [phone] and [phone].   Social Determinants of Health   Financial Resource Strain: Low Risk  (05/04/2022)   Overall Financial Resource Strain (CARDIA)    Difficulty of Paying Living Expenses: Not very hard  Food Insecurity: No Food Insecurity (05/04/2022)   Hunger Vital Sign    Worried About Running Out of Food in the Last Year: Never true    Ran Out of Food in the Last Year: Never true  Transportation Needs: No Transportation Needs (05/04/2022)   PRAPARE - Administrator, Civil Service (Medical): No    Lack of Transportation (Non-Medical): No  Physical Activity: Inactive (05/04/2022)   Exercise Vital  Sign    Days of Exercise per Week: 0 days    Minutes of Exercise per Session: 10 min  Stress: No Stress Concern Present (05/04/2022)   [employer] of Occupational Health - Occupational Stress Questionnaire    Feeling of Stress : Only a little  Social Connections: Moderately Integrated (05/04/2022)   Social Connection and Isolation Panel [NHANES]    Frequency of Communication with Friends and Family: More than three times a week    Frequency of Social Gatherings with Friends and Family: More than three times a week    Attends Religious Services: More than 4 times per year    Active Member of [employer] or Organizations: Yes    Attends Engineer, structural: More than 4 times per year    Marital Status: Divorced  Intimate Partner Violence: Not At Risk (05/04/2022)   Humiliation, Afraid, Rape, and Kick questionnaire    Fear of Current or Ex-Partner: No    Emotionally Abused: No    Physically Abused: No    Sexually Abused: No    Review of Systems: ROS negative except for what is noted on the assessment and plan.  Objective:   Vitals:   01/01/23 1441  BP: 120/84  Pulse: 86  Temp: 98.2 F (36.8 C)  TempSrc: Oral  SpO2: 95%  Weight: 203 lb 12.8 oz (92.4 kg)  Height: 5\' 8"  (1.727 m)    Physical Exam: Constitutional: well-appearing  Cardiovascular: regular rate and rhythm, no m/r/g Pulmonary/Chest: normal work of breathing on room air, lungs clear to auscultation bilaterally Abdominal: soft, non-tender, non-distended MSK: knee brace in place to right knee Neurological: antalgic gait Skin: warm and dry  Assessment & Plan:  IPF (idiopathic pulmonary fibrosis) (HCC) She is unsure about taking medication to prevent progression of IPF. From office visit with pulmonology, her understanding is that the scarring is minimal. She has some dyspnea when she walks for long distances but otherwise feels well. She read the information about nintedanib and is concerned as it  mentions having to have frequent follow-up for liver functions testing. We talked about indication for medication to stop progression of disease which she understands. She has also talked with Dr. Sharyn Lull, a cardiologist, about medication. She has requested that pulmonology group stop medication from being sent to her house for now. She reports difficulty with reaching someone at their office to arrange follow-up to talk about medication. P: Smoking cessation encouraged. She is smoking about 3-4 cigarettes daily. She is not currently interested  in NRT or chantix. She will think about how she would like  to approach this and let me know if she want medication sent in for smoking cessation.  Message sent to Dr. Isaiah Serge to see if follow-up appointment can be made, she is concerned about medication but also seems worried about risk for progression.   Patient discussed with Dr. Josetta Huddle Tavaras Goody, D.O. [facility]  PGY-3 Pager: [phone]  Phone: [phone] Date 01/02/2023  Time 7:05 AM

## 2023-01-02 ENCOUNTER — Encounter: Payer: Self-pay | Admitting: Internal Medicine

## 2023-01-02 NOTE — Assessment & Plan Note (Addendum)
She is unsure about taking medication to prevent progression of IPF. From office visit with pulmonology, her understanding is that the scarring is minimal. She has some dyspnea when she walks for long distances but otherwise feels well. She read the information about nintedanib and is concerned as it mentions having to have frequent follow-up for liver functions testing. We talked about indication for medication to stop progression of disease which she understands. She has also talked with Dr. Sharyn Lull, a cardiologist, about medication. She has requested that pulmonology group stop medication from being sent to her house for now. She reports difficulty with reaching someone at their office to arrange follow-up to talk about medication. P: Smoking cessation encouraged. She is smoking about 3-4 cigarettes daily. She is not currently interested  in NRT or chantix. She will think about how she would like to approach this and let me know if she want medication sent in for smoking cessation.  Message sent to Dr. Isaiah Serge to see if follow-up appointment can be made, she is concerned about medication but also seems worried about risk for progression.

## 2023-01-02 NOTE — Progress Notes (Signed)
Internal Medicine Clinic Attending  Case discussed with the resident physician at the time of the visit.  We reviewed the patient's history, exam, and pertinent patient test results.  I agree with the assessment, diagnosis, and plan of care documented in the resident's note.

## 2023-01-03 DIAGNOSIS — M47816 Spondylosis without myelopathy or radiculopathy, lumbar region: Secondary | ICD-10-CM | POA: Diagnosis not present

## 2023-01-03 DIAGNOSIS — S86811D Strain of other muscle(s) and tendon(s) at lower leg level, right leg, subsequent encounter: Secondary | ICD-10-CM | POA: Diagnosis not present

## 2023-01-03 DIAGNOSIS — N189 Chronic kidney disease, unspecified: Secondary | ICD-10-CM | POA: Diagnosis not present

## 2023-01-03 DIAGNOSIS — K2970 Gastritis, unspecified, without bleeding: Secondary | ICD-10-CM | POA: Diagnosis not present

## 2023-01-03 DIAGNOSIS — K5900 Constipation, unspecified: Secondary | ICD-10-CM | POA: Diagnosis not present

## 2023-01-03 DIAGNOSIS — G8929 Other chronic pain: Secondary | ICD-10-CM | POA: Diagnosis not present

## 2023-01-03 DIAGNOSIS — Z9181 History of falling: Secondary | ICD-10-CM | POA: Diagnosis not present

## 2023-01-03 DIAGNOSIS — S32592D Other specified fracture of left pubis, subsequent encounter for fracture with routine healing: Secondary | ICD-10-CM | POA: Diagnosis not present

## 2023-01-03 DIAGNOSIS — F17200 Nicotine dependence, unspecified, uncomplicated: Secondary | ICD-10-CM | POA: Diagnosis not present

## 2023-01-03 DIAGNOSIS — I129 Hypertensive chronic kidney disease with stage 1 through stage 4 chronic kidney disease, or unspecified chronic kidney disease: Secondary | ICD-10-CM | POA: Diagnosis not present

## 2023-01-03 DIAGNOSIS — Z79899 Other long term (current) drug therapy: Secondary | ICD-10-CM | POA: Diagnosis not present

## 2023-01-03 DIAGNOSIS — Z556 Problems related to health literacy: Secondary | ICD-10-CM | POA: Diagnosis not present

## 2023-01-03 DIAGNOSIS — Z7982 Long term (current) use of aspirin: Secondary | ICD-10-CM | POA: Diagnosis not present

## 2023-01-03 DIAGNOSIS — S39012D Strain of muscle, fascia and tendon of lower back, subsequent encounter: Secondary | ICD-10-CM | POA: Diagnosis not present

## 2023-01-03 DIAGNOSIS — Z604 Social exclusion and rejection: Secondary | ICD-10-CM | POA: Diagnosis not present

## 2023-01-03 DIAGNOSIS — K219 Gastro-esophageal reflux disease without esophagitis: Secondary | ICD-10-CM | POA: Diagnosis not present

## 2023-01-03 DIAGNOSIS — E7800 Pure hypercholesterolemia, unspecified: Secondary | ICD-10-CM | POA: Diagnosis not present

## 2023-01-05 DIAGNOSIS — S86811D Strain of other muscle(s) and tendon(s) at lower leg level, right leg, subsequent encounter: Secondary | ICD-10-CM | POA: Diagnosis not present

## 2023-01-05 DIAGNOSIS — Z604 Social exclusion and rejection: Secondary | ICD-10-CM | POA: Diagnosis not present

## 2023-01-05 DIAGNOSIS — E7800 Pure hypercholesterolemia, unspecified: Secondary | ICD-10-CM | POA: Diagnosis not present

## 2023-01-05 DIAGNOSIS — F17200 Nicotine dependence, unspecified, uncomplicated: Secondary | ICD-10-CM | POA: Diagnosis not present

## 2023-01-05 DIAGNOSIS — K2970 Gastritis, unspecified, without bleeding: Secondary | ICD-10-CM | POA: Diagnosis not present

## 2023-01-05 DIAGNOSIS — Z79899 Other long term (current) drug therapy: Secondary | ICD-10-CM | POA: Diagnosis not present

## 2023-01-05 DIAGNOSIS — M47816 Spondylosis without myelopathy or radiculopathy, lumbar region: Secondary | ICD-10-CM | POA: Diagnosis not present

## 2023-01-05 DIAGNOSIS — Z9181 History of falling: Secondary | ICD-10-CM | POA: Diagnosis not present

## 2023-01-05 DIAGNOSIS — Z556 Problems related to health literacy: Secondary | ICD-10-CM | POA: Diagnosis not present

## 2023-01-05 DIAGNOSIS — G8929 Other chronic pain: Secondary | ICD-10-CM | POA: Diagnosis not present

## 2023-01-05 DIAGNOSIS — S32592D Other specified fracture of left pubis, subsequent encounter for fracture with routine healing: Secondary | ICD-10-CM | POA: Diagnosis not present

## 2023-01-05 DIAGNOSIS — S39012D Strain of muscle, fascia and tendon of lower back, subsequent encounter: Secondary | ICD-10-CM | POA: Diagnosis not present

## 2023-01-05 DIAGNOSIS — K5900 Constipation, unspecified: Secondary | ICD-10-CM | POA: Diagnosis not present

## 2023-01-05 DIAGNOSIS — I129 Hypertensive chronic kidney disease with stage 1 through stage 4 chronic kidney disease, or unspecified chronic kidney disease: Secondary | ICD-10-CM | POA: Diagnosis not present

## 2023-01-05 DIAGNOSIS — N189 Chronic kidney disease, unspecified: Secondary | ICD-10-CM | POA: Diagnosis not present

## 2023-01-05 DIAGNOSIS — Z7982 Long term (current) use of aspirin: Secondary | ICD-10-CM | POA: Diagnosis not present

## 2023-01-05 DIAGNOSIS — K219 Gastro-esophageal reflux disease without esophagitis: Secondary | ICD-10-CM | POA: Diagnosis not present

## 2023-01-09 DIAGNOSIS — Z79899 Other long term (current) drug therapy: Secondary | ICD-10-CM | POA: Diagnosis not present

## 2023-01-09 DIAGNOSIS — I129 Hypertensive chronic kidney disease with stage 1 through stage 4 chronic kidney disease, or unspecified chronic kidney disease: Secondary | ICD-10-CM | POA: Diagnosis not present

## 2023-01-09 DIAGNOSIS — K5900 Constipation, unspecified: Secondary | ICD-10-CM | POA: Diagnosis not present

## 2023-01-09 DIAGNOSIS — G8929 Other chronic pain: Secondary | ICD-10-CM | POA: Diagnosis not present

## 2023-01-09 DIAGNOSIS — Z7982 Long term (current) use of aspirin: Secondary | ICD-10-CM | POA: Diagnosis not present

## 2023-01-09 DIAGNOSIS — S86811D Strain of other muscle(s) and tendon(s) at lower leg level, right leg, subsequent encounter: Secondary | ICD-10-CM | POA: Diagnosis not present

## 2023-01-09 DIAGNOSIS — N189 Chronic kidney disease, unspecified: Secondary | ICD-10-CM | POA: Diagnosis not present

## 2023-01-09 DIAGNOSIS — E7800 Pure hypercholesterolemia, unspecified: Secondary | ICD-10-CM | POA: Diagnosis not present

## 2023-01-09 DIAGNOSIS — Z9181 History of falling: Secondary | ICD-10-CM | POA: Diagnosis not present

## 2023-01-09 DIAGNOSIS — K219 Gastro-esophageal reflux disease without esophagitis: Secondary | ICD-10-CM | POA: Diagnosis not present

## 2023-01-09 DIAGNOSIS — S39012D Strain of muscle, fascia and tendon of lower back, subsequent encounter: Secondary | ICD-10-CM | POA: Diagnosis not present

## 2023-01-09 DIAGNOSIS — M47816 Spondylosis without myelopathy or radiculopathy, lumbar region: Secondary | ICD-10-CM | POA: Diagnosis not present

## 2023-01-09 DIAGNOSIS — K2970 Gastritis, unspecified, without bleeding: Secondary | ICD-10-CM | POA: Diagnosis not present

## 2023-01-09 DIAGNOSIS — Z604 Social exclusion and rejection: Secondary | ICD-10-CM | POA: Diagnosis not present

## 2023-01-09 DIAGNOSIS — Z556 Problems related to health literacy: Secondary | ICD-10-CM | POA: Diagnosis not present

## 2023-01-09 DIAGNOSIS — S32592D Other specified fracture of left pubis, subsequent encounter for fracture with routine healing: Secondary | ICD-10-CM | POA: Diagnosis not present

## 2023-01-09 DIAGNOSIS — F17200 Nicotine dependence, unspecified, uncomplicated: Secondary | ICD-10-CM | POA: Diagnosis not present

## 2023-01-11 DIAGNOSIS — S32592D Other specified fracture of left pubis, subsequent encounter for fracture with routine healing: Secondary | ICD-10-CM | POA: Diagnosis not present

## 2023-01-11 DIAGNOSIS — S39012D Strain of muscle, fascia and tendon of lower back, subsequent encounter: Secondary | ICD-10-CM | POA: Diagnosis not present

## 2023-01-11 DIAGNOSIS — N189 Chronic kidney disease, unspecified: Secondary | ICD-10-CM | POA: Diagnosis not present

## 2023-01-11 DIAGNOSIS — K219 Gastro-esophageal reflux disease without esophagitis: Secondary | ICD-10-CM | POA: Diagnosis not present

## 2023-01-11 DIAGNOSIS — G8929 Other chronic pain: Secondary | ICD-10-CM | POA: Diagnosis not present

## 2023-01-11 DIAGNOSIS — Z9181 History of falling: Secondary | ICD-10-CM | POA: Diagnosis not present

## 2023-01-11 DIAGNOSIS — E7800 Pure hypercholesterolemia, unspecified: Secondary | ICD-10-CM | POA: Diagnosis not present

## 2023-01-11 DIAGNOSIS — F17200 Nicotine dependence, unspecified, uncomplicated: Secondary | ICD-10-CM | POA: Diagnosis not present

## 2023-01-11 DIAGNOSIS — K2970 Gastritis, unspecified, without bleeding: Secondary | ICD-10-CM | POA: Diagnosis not present

## 2023-01-11 DIAGNOSIS — M47816 Spondylosis without myelopathy or radiculopathy, lumbar region: Secondary | ICD-10-CM | POA: Diagnosis not present

## 2023-01-11 DIAGNOSIS — Z79899 Other long term (current) drug therapy: Secondary | ICD-10-CM | POA: Diagnosis not present

## 2023-01-11 DIAGNOSIS — Z604 Social exclusion and rejection: Secondary | ICD-10-CM | POA: Diagnosis not present

## 2023-01-11 DIAGNOSIS — S86811D Strain of other muscle(s) and tendon(s) at lower leg level, right leg, subsequent encounter: Secondary | ICD-10-CM | POA: Diagnosis not present

## 2023-01-11 DIAGNOSIS — Z7982 Long term (current) use of aspirin: Secondary | ICD-10-CM | POA: Diagnosis not present

## 2023-01-11 DIAGNOSIS — K5900 Constipation, unspecified: Secondary | ICD-10-CM | POA: Diagnosis not present

## 2023-01-11 DIAGNOSIS — Z556 Problems related to health literacy: Secondary | ICD-10-CM | POA: Diagnosis not present

## 2023-01-11 DIAGNOSIS — I129 Hypertensive chronic kidney disease with stage 1 through stage 4 chronic kidney disease, or unspecified chronic kidney disease: Secondary | ICD-10-CM | POA: Diagnosis not present

## 2023-01-15 DIAGNOSIS — G8929 Other chronic pain: Secondary | ICD-10-CM | POA: Diagnosis not present

## 2023-01-15 DIAGNOSIS — M47816 Spondylosis without myelopathy or radiculopathy, lumbar region: Secondary | ICD-10-CM | POA: Diagnosis not present

## 2023-01-15 DIAGNOSIS — K219 Gastro-esophageal reflux disease without esophagitis: Secondary | ICD-10-CM | POA: Diagnosis not present

## 2023-01-15 DIAGNOSIS — Z7982 Long term (current) use of aspirin: Secondary | ICD-10-CM | POA: Diagnosis not present

## 2023-01-15 DIAGNOSIS — N189 Chronic kidney disease, unspecified: Secondary | ICD-10-CM | POA: Diagnosis not present

## 2023-01-15 DIAGNOSIS — F17200 Nicotine dependence, unspecified, uncomplicated: Secondary | ICD-10-CM | POA: Diagnosis not present

## 2023-01-15 DIAGNOSIS — Z556 Problems related to health literacy: Secondary | ICD-10-CM | POA: Diagnosis not present

## 2023-01-15 DIAGNOSIS — I129 Hypertensive chronic kidney disease with stage 1 through stage 4 chronic kidney disease, or unspecified chronic kidney disease: Secondary | ICD-10-CM | POA: Diagnosis not present

## 2023-01-15 DIAGNOSIS — Z9181 History of falling: Secondary | ICD-10-CM | POA: Diagnosis not present

## 2023-01-15 DIAGNOSIS — E7800 Pure hypercholesterolemia, unspecified: Secondary | ICD-10-CM | POA: Diagnosis not present

## 2023-01-15 DIAGNOSIS — K2970 Gastritis, unspecified, without bleeding: Secondary | ICD-10-CM | POA: Diagnosis not present

## 2023-01-15 DIAGNOSIS — S39012D Strain of muscle, fascia and tendon of lower back, subsequent encounter: Secondary | ICD-10-CM | POA: Diagnosis not present

## 2023-01-15 DIAGNOSIS — S86811D Strain of other muscle(s) and tendon(s) at lower leg level, right leg, subsequent encounter: Secondary | ICD-10-CM | POA: Diagnosis not present

## 2023-01-15 DIAGNOSIS — Z604 Social exclusion and rejection: Secondary | ICD-10-CM | POA: Diagnosis not present

## 2023-01-15 DIAGNOSIS — S32592D Other specified fracture of left pubis, subsequent encounter for fracture with routine healing: Secondary | ICD-10-CM | POA: Diagnosis not present

## 2023-01-15 DIAGNOSIS — Z79899 Other long term (current) drug therapy: Secondary | ICD-10-CM | POA: Diagnosis not present

## 2023-01-15 DIAGNOSIS — K5900 Constipation, unspecified: Secondary | ICD-10-CM | POA: Diagnosis not present

## 2023-01-23 DIAGNOSIS — M25552 Pain in left hip: Secondary | ICD-10-CM | POA: Diagnosis not present

## 2023-02-02 DIAGNOSIS — I129 Hypertensive chronic kidney disease with stage 1 through stage 4 chronic kidney disease, or unspecified chronic kidney disease: Secondary | ICD-10-CM | POA: Diagnosis not present

## 2023-02-02 DIAGNOSIS — F17200 Nicotine dependence, unspecified, uncomplicated: Secondary | ICD-10-CM | POA: Diagnosis not present

## 2023-02-02 DIAGNOSIS — E7800 Pure hypercholesterolemia, unspecified: Secondary | ICD-10-CM | POA: Diagnosis not present

## 2023-02-02 DIAGNOSIS — Z7982 Long term (current) use of aspirin: Secondary | ICD-10-CM | POA: Diagnosis not present

## 2023-02-02 DIAGNOSIS — G8929 Other chronic pain: Secondary | ICD-10-CM | POA: Diagnosis not present

## 2023-02-02 DIAGNOSIS — S86811D Strain of other muscle(s) and tendon(s) at lower leg level, right leg, subsequent encounter: Secondary | ICD-10-CM | POA: Diagnosis not present

## 2023-02-02 DIAGNOSIS — Z556 Problems related to health literacy: Secondary | ICD-10-CM | POA: Diagnosis not present

## 2023-02-02 DIAGNOSIS — K5900 Constipation, unspecified: Secondary | ICD-10-CM | POA: Diagnosis not present

## 2023-02-02 DIAGNOSIS — M47816 Spondylosis without myelopathy or radiculopathy, lumbar region: Secondary | ICD-10-CM | POA: Diagnosis not present

## 2023-02-02 DIAGNOSIS — S39012D Strain of muscle, fascia and tendon of lower back, subsequent encounter: Secondary | ICD-10-CM | POA: Diagnosis not present

## 2023-02-02 DIAGNOSIS — K2970 Gastritis, unspecified, without bleeding: Secondary | ICD-10-CM | POA: Diagnosis not present

## 2023-02-02 DIAGNOSIS — K219 Gastro-esophageal reflux disease without esophagitis: Secondary | ICD-10-CM | POA: Diagnosis not present

## 2023-02-02 DIAGNOSIS — Z79899 Other long term (current) drug therapy: Secondary | ICD-10-CM | POA: Diagnosis not present

## 2023-02-02 DIAGNOSIS — S32592D Other specified fracture of left pubis, subsequent encounter for fracture with routine healing: Secondary | ICD-10-CM | POA: Diagnosis not present

## 2023-02-02 DIAGNOSIS — Z604 Social exclusion and rejection: Secondary | ICD-10-CM | POA: Diagnosis not present

## 2023-02-02 DIAGNOSIS — N189 Chronic kidney disease, unspecified: Secondary | ICD-10-CM | POA: Diagnosis not present

## 2023-02-02 DIAGNOSIS — Z9181 History of falling: Secondary | ICD-10-CM | POA: Diagnosis not present

## 2023-02-06 DIAGNOSIS — E7800 Pure hypercholesterolemia, unspecified: Secondary | ICD-10-CM | POA: Diagnosis not present

## 2023-02-06 DIAGNOSIS — I129 Hypertensive chronic kidney disease with stage 1 through stage 4 chronic kidney disease, or unspecified chronic kidney disease: Secondary | ICD-10-CM | POA: Diagnosis not present

## 2023-02-06 DIAGNOSIS — S39012D Strain of muscle, fascia and tendon of lower back, subsequent encounter: Secondary | ICD-10-CM | POA: Diagnosis not present

## 2023-02-06 DIAGNOSIS — Z79899 Other long term (current) drug therapy: Secondary | ICD-10-CM | POA: Diagnosis not present

## 2023-02-06 DIAGNOSIS — M47816 Spondylosis without myelopathy or radiculopathy, lumbar region: Secondary | ICD-10-CM | POA: Diagnosis not present

## 2023-02-06 DIAGNOSIS — S32592D Other specified fracture of left pubis, subsequent encounter for fracture with routine healing: Secondary | ICD-10-CM | POA: Diagnosis not present

## 2023-02-06 DIAGNOSIS — N189 Chronic kidney disease, unspecified: Secondary | ICD-10-CM | POA: Diagnosis not present

## 2023-02-06 DIAGNOSIS — K219 Gastro-esophageal reflux disease without esophagitis: Secondary | ICD-10-CM | POA: Diagnosis not present

## 2023-02-06 DIAGNOSIS — G8929 Other chronic pain: Secondary | ICD-10-CM | POA: Diagnosis not present

## 2023-02-06 DIAGNOSIS — Z604 Social exclusion and rejection: Secondary | ICD-10-CM | POA: Diagnosis not present

## 2023-02-06 DIAGNOSIS — K5900 Constipation, unspecified: Secondary | ICD-10-CM | POA: Diagnosis not present

## 2023-02-06 DIAGNOSIS — Z7982 Long term (current) use of aspirin: Secondary | ICD-10-CM | POA: Diagnosis not present

## 2023-02-06 DIAGNOSIS — F17200 Nicotine dependence, unspecified, uncomplicated: Secondary | ICD-10-CM | POA: Diagnosis not present

## 2023-02-06 DIAGNOSIS — K2970 Gastritis, unspecified, without bleeding: Secondary | ICD-10-CM | POA: Diagnosis not present

## 2023-02-06 DIAGNOSIS — Z9181 History of falling: Secondary | ICD-10-CM | POA: Diagnosis not present

## 2023-02-06 DIAGNOSIS — Z556 Problems related to health literacy: Secondary | ICD-10-CM | POA: Diagnosis not present

## 2023-02-06 DIAGNOSIS — S86811D Strain of other muscle(s) and tendon(s) at lower leg level, right leg, subsequent encounter: Secondary | ICD-10-CM | POA: Diagnosis not present

## 2023-02-12 DIAGNOSIS — Z9181 History of falling: Secondary | ICD-10-CM | POA: Diagnosis not present

## 2023-02-12 DIAGNOSIS — S39012D Strain of muscle, fascia and tendon of lower back, subsequent encounter: Secondary | ICD-10-CM | POA: Diagnosis not present

## 2023-02-12 DIAGNOSIS — N189 Chronic kidney disease, unspecified: Secondary | ICD-10-CM | POA: Diagnosis not present

## 2023-02-12 DIAGNOSIS — I129 Hypertensive chronic kidney disease with stage 1 through stage 4 chronic kidney disease, or unspecified chronic kidney disease: Secondary | ICD-10-CM | POA: Diagnosis not present

## 2023-02-12 DIAGNOSIS — M47816 Spondylosis without myelopathy or radiculopathy, lumbar region: Secondary | ICD-10-CM | POA: Diagnosis not present

## 2023-02-12 DIAGNOSIS — K219 Gastro-esophageal reflux disease without esophagitis: Secondary | ICD-10-CM | POA: Diagnosis not present

## 2023-02-12 DIAGNOSIS — F17200 Nicotine dependence, unspecified, uncomplicated: Secondary | ICD-10-CM | POA: Diagnosis not present

## 2023-02-12 DIAGNOSIS — K5900 Constipation, unspecified: Secondary | ICD-10-CM | POA: Diagnosis not present

## 2023-02-12 DIAGNOSIS — G8929 Other chronic pain: Secondary | ICD-10-CM | POA: Diagnosis not present

## 2023-02-12 DIAGNOSIS — S86811D Strain of other muscle(s) and tendon(s) at lower leg level, right leg, subsequent encounter: Secondary | ICD-10-CM | POA: Diagnosis not present

## 2023-02-12 DIAGNOSIS — E7800 Pure hypercholesterolemia, unspecified: Secondary | ICD-10-CM | POA: Diagnosis not present

## 2023-02-12 DIAGNOSIS — Z604 Social exclusion and rejection: Secondary | ICD-10-CM | POA: Diagnosis not present

## 2023-02-12 DIAGNOSIS — K2970 Gastritis, unspecified, without bleeding: Secondary | ICD-10-CM | POA: Diagnosis not present

## 2023-02-12 DIAGNOSIS — Z7982 Long term (current) use of aspirin: Secondary | ICD-10-CM | POA: Diagnosis not present

## 2023-02-12 DIAGNOSIS — Z556 Problems related to health literacy: Secondary | ICD-10-CM | POA: Diagnosis not present

## 2023-02-12 DIAGNOSIS — S32592D Other specified fracture of left pubis, subsequent encounter for fracture with routine healing: Secondary | ICD-10-CM | POA: Diagnosis not present

## 2023-02-12 DIAGNOSIS — Z79899 Other long term (current) drug therapy: Secondary | ICD-10-CM | POA: Diagnosis not present

## 2023-02-13 DIAGNOSIS — M545 Low back pain, unspecified: Secondary | ICD-10-CM | POA: Diagnosis not present

## 2023-02-14 DIAGNOSIS — K219 Gastro-esophageal reflux disease without esophagitis: Secondary | ICD-10-CM | POA: Diagnosis not present

## 2023-02-14 DIAGNOSIS — I10 Essential (primary) hypertension: Secondary | ICD-10-CM | POA: Diagnosis not present

## 2023-02-14 DIAGNOSIS — J849 Interstitial pulmonary disease, unspecified: Secondary | ICD-10-CM | POA: Diagnosis not present

## 2023-02-14 DIAGNOSIS — E782 Mixed hyperlipidemia: Secondary | ICD-10-CM | POA: Diagnosis not present

## 2023-02-15 ENCOUNTER — Other Ambulatory Visit: Payer: Self-pay | Admitting: Internal Medicine

## 2023-02-15 DIAGNOSIS — E7800 Pure hypercholesterolemia, unspecified: Secondary | ICD-10-CM

## 2023-02-15 NOTE — Telephone Encounter (Signed)
Next appt scheduled 10/16 with Dr Geraldo Pitter.

## 2023-02-26 ENCOUNTER — Ambulatory Visit (INDEPENDENT_AMBULATORY_CARE_PROVIDER_SITE_OTHER): Payer: 59 | Admitting: Pulmonary Disease

## 2023-02-26 ENCOUNTER — Encounter: Payer: Self-pay | Admitting: Pulmonary Disease

## 2023-02-26 VITALS — BP 112/68 | HR 92 | Temp 97.3°F | Ht 68.5 in | Wt 208.0 lb

## 2023-02-26 DIAGNOSIS — J849 Interstitial pulmonary disease, unspecified: Secondary | ICD-10-CM

## 2023-02-26 NOTE — Progress Notes (Addendum)
Laura [name]    [id number]    1946/04/24  Primary Care Physician:Masters, Florentina Addison, DO  Referring Physician: Rudene Christians, DO [address] [place],  [place] [address]  Chief complaint: Follow-up for abnormal CT, interstitial lung disease  HPI: 77 y.o. who  has a past medical history of Chronic back pain, Chronic kidney disease, GERD (gastroesophageal reflux disease), High cholesterol, IPF (idiopathic pulmonary fibrosis) (HCC), Lymphadenopathy, and Substance abuse (HCC).   Referred for evaluation of abnormal imaging on low-dose screening CT which showed pulmonary fibrosis History notable for ongoing smoking, marijuana use.  She used cocaine until 2021  Has mild dyspnea on exertion.  Denies any cough, chest congestion or sputum production.  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  No feather pillows or comforters. Smoking history: 28-pack-year smoker.  Smokes up to 8 cigarettes/day and 1-2 marijuana joints per day Travel history: Previously lived in [place] in [place].  No significant recent travel Relevant family history: Her siblings have unspecified lung issues.  The are smokers.  Interim history: States that breathing is doing well with no issues Quit smoking 3 months ago She was prescribed Ofev at last visit but after review of side effects she declined starting the therapy  Outpatient Encounter Medications as of 02/26/2023  Medication Sig   acetaminophen (TYLENOL) 500 MG tablet Take 2 tablets (1,000 mg total) by mouth every 8 (eight) hours as needed.   albuterol (VENTOLIN HFA) 108 (90 Base) MCG/ACT inhaler INHALE 1-2 PUFFS BY MOUTH EVERY 6 HOURS AS NEEDED FOR WHEEZE OR SHORTNESS OF BREATH   aspirin 81 MG chewable tablet Chew 81 mg by mouth daily.   diclofenac Sodium (VOLTAREN) 1 % GEL Apply 4 g topically 4 (four) times daily.   fluticasone (FLONASE) 50  MCG/ACT nasal spray USE 1-2 SPRAYS IN EACH NOSTRIL DAILY   gabapentin (NEURONTIN) 600 MG tablet Take 1 tablet (600 mg total) by mouth 4 (four) times daily.   GEMTESA 75 MG TABS Take 1 tablet by mouth daily.   metoprolol succinate (TOPROL-XL) 25 MG 24 hr tablet Take 25 mg by mouth daily.   pantoprazole (PROTONIX) 40 MG tablet TAKE 1 TABLET BY MOUTH EVERY DAY   rosuvastatin (CRESTOR) 10 MG tablet TAKE 1 TABLET BY MOUTH EVERY DAY   Sodium Chloride-Sodium Bicarb (NETI POT SINUS WASH) 2300-700 MG KIT Place 1 kit into the nose daily.   traMADol (ULTRAM) 50 MG tablet Take 50 mg by mouth 3 (three) times daily as needed.   amLODipine (NORVASC) 10 MG tablet Take 1 tablet (10 mg total) by mouth daily.   HYDROcodone-acetaminophen (NORCO/VICODIN) 5-325 MG tablet Take 1 tablet by mouth every 4 (four) hours as needed. (Patient not taking: Reported on 02/26/2023)   No facility-administered encounter medications on file as of 02/26/2023.    Physical Exam: Blood pressure 112/68, pulse 92, temperature (!) 97.3 F (36.3 C), temperature source Temporal, height 5' 8.5" (1.74 m), weight 208 lb (94.3 kg), SpO2 96%. Gen:      No acute distress HEENT:  EOMI, sclera anicteric Neck:     No masses; no thyromegaly Lungs:    Clear to auscultation bilaterally; normal respiratory effort CV:         Regular rate and rhythm; no murmurs Abd:      + bowel sounds; soft, non-tender; no palpable masses, no distension Ext:    No edema; adequate peripheral  perfusion Skin:      Warm and dry; no rash Neuro: alert and oriented x 3 Psych: normal mood and affect   Data Reviewed: Imaging: CT chest 12/09/2014-mild emphysema, subcentimeter pulmonary nodule, subtle reticulation at the lung base CT chest 05/09/2022-stable pulmonary nodule, basilar predominant reticular opacities with traction bronchiectasis appears progressive from 2016. High resolution CT 09/27/2022-mild pulmonary fibrosis and probable UIP pattern, dilated pulmonary artery,  emphysema. I have reviewed the images personally.  PFTs: 05/23/2022 FVC 2.60 [80%], FEV1 2.13 [87%], F/F82, TLC 5.98 [108%], DLCO 13.36 [48%] Severe diffusion defect  Labs: CTD serologies 09/08/2022 significant only for ANA 1: 80, cytoplasmic  Cardiac: Echocardiogram 10/03/2022-LVEF 55-60%, grade 1 diastolic dysfunction, normal PA systolic pressure  Assessment:  Pulmonary fibrosis, interstitial lung disease CT shows pulmonary fibrosis in probable UIP pattern.  There are subtle changes back in 2016 and appears to have progressed since then.  There are no significant exposures or symptoms of autoimmune disease.  Titers are low which is likely nonsignificant as she does not have any evidence of lupus  PFTs reviewed with isolated diffusion defect with normal lung volumes and spirometry.  No evidence of pulmonary hypertension on echocardiogram  This is likely to be IPF and as she has progression on CT scan she is a candidate for antifibrotic therapy We discussed treatment options and she has decided not to try any antifibrotic due to fear of side effects As per her preference we will monitor this and get a follow-up CT scan  Plan/Recommendations: CT scan in 7 months  Chilton Greathouse MD [facility] 02/26/2023, 3:27 PM  CC: Masters, Florentina Addison, DO

## 2023-02-26 NOTE — Addendum Note (Signed)
Addended by: Lanna Poche on: 02/26/2023 03:58 PM   Modules accepted: Orders

## 2023-02-26 NOTE — Patient Instructions (Signed)
Will hold off on starting the Ofev since you are worried about side effects Will continue monitoring Order high-res CT in 7 months Return to clinic in 7 months after CT scan

## 2023-02-26 NOTE — Progress Notes (Deleted)
Laura [name]    [id number]    25-May-1946  Primary Care Physician:Masters, Florentina Addison, DO  Referring Physician: Rudene Christians, DO [address] [place],  [place] [address]  Chief complaint: Follow-up for abnormal CT, interstitial lung disease  HPI: 77 y.o. who  has a past medical history of Chronic back pain, Chronic kidney disease, GERD (gastroesophageal reflux disease), High cholesterol, IPF (idiopathic pulmonary fibrosis) (HCC), Lymphadenopathy, and Substance abuse (HCC).   Referred for evaluation of abnormal imaging on low-dose screening CT which showed pulmonary fibrosis History notable for ongoing smoking, marijuana use.  She used cocaine until 2021  Has mild dyspnea on exertion.  Denies any cough, chest congestion or sputum production.  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  No feather pillows or comforters. Smoking history: 28-pack-year smoker.  Smokes up to 8 cigarettes/day and 1-2 marijuana joints per day Travel history: Previously lived in [place] in [place].  No significant recent travel Relevant family history: Her siblings have unspecified lung issues.  The are smokers.  Interim history: Here for review of CT, echocardiogram and labs States that breathing is doing well with no issues Continues to smoke.  Outpatient Encounter Medications as of 02/26/2023  Medication Sig   acetaminophen (TYLENOL) 500 MG tablet Take 2 tablets (1,000 mg total) by mouth every 8 (eight) hours as needed.   albuterol (VENTOLIN HFA) 108 (90 Base) MCG/ACT inhaler INHALE 1-2 PUFFS BY MOUTH EVERY 6 HOURS AS NEEDED FOR WHEEZE OR SHORTNESS OF BREATH   aspirin 81 MG chewable tablet Chew 81 mg by mouth daily.   diclofenac Sodium (VOLTAREN) 1 % GEL Apply 4 g topically 4 (four) times daily.   fluticasone (FLONASE) 50 MCG/ACT nasal spray USE 1-2 SPRAYS IN EACH NOSTRIL DAILY    gabapentin (NEURONTIN) 600 MG tablet Take 1 tablet (600 mg total) by mouth 4 (four) times daily.   GEMTESA 75 MG TABS Take 1 tablet by mouth daily.   metoprolol succinate (TOPROL-XL) 25 MG 24 hr tablet Take 25 mg by mouth daily.   pantoprazole (PROTONIX) 40 MG tablet TAKE 1 TABLET BY MOUTH EVERY DAY   rosuvastatin (CRESTOR) 10 MG tablet TAKE 1 TABLET BY MOUTH EVERY DAY   Sodium Chloride-Sodium Bicarb (NETI POT SINUS WASH) 2300-700 MG KIT Place 1 kit into the nose daily.   traMADol (ULTRAM) 50 MG tablet Take 50 mg by mouth 3 (three) times daily as needed.   amLODipine (NORVASC) 10 MG tablet Take 1 tablet (10 mg total) by mouth daily.   HYDROcodone-acetaminophen (NORCO/VICODIN) 5-325 MG tablet Take 1 tablet by mouth every 4 (four) hours as needed. (Patient not taking: Reported on 02/26/2023)   No facility-administered encounter medications on file as of 02/26/2023.    Physical Exam: Blood pressure 124/66, pulse 76, temperature 97.9 F (36.6 C), temperature source Oral, height 5\' 8"  (1.727 m), weight 202 lb (91.6 kg), SpO2 100 %. Gen:      No acute distress HEENT:  EOMI, sclera anicteric Neck:     No masses; no thyromegaly Lungs:    Clear to auscultation bilaterally; normal respiratory effort CV:         Regular rate and rhythm; no murmurs Abd:      + bowel sounds; soft, non-tender; no palpable masses, no distension Ext:    No edema; adequate peripheral perfusion Skin:      Warm and dry; no rash  Neuro: alert and oriented x 3 Psych: normal mood and affect   Data Reviewed: Imaging: CT chest 12/09/2014-mild emphysema, subcentimeter pulmonary nodule, subtle reticulation at the lung base CT chest 05/09/2022-stable pulmonary nodule, basilar predominant reticular opacities with traction bronchiectasis appears progressive from 2016. High resolution CT 09/27/2022-mild pulmonary fibrosis and probable UIP pattern, dilated pulmonary artery, emphysema. I have reviewed the images  personally.  PFTs: 05/23/2022 FVC 2.60 [80%], FEV1 2.13 [87%], F/F82, TLC 5.98 [108%], DLCO 13.36 [48%] Severe diffusion defect  Labs: CTD serologies 09/08/2022 significant only for ANA 1: 80, cytoplasmic  Cardiac: Echocardiogram 10/03/2022-LVEF 55-60%, grade 1 diastolic dysfunction, normal PA systolic pressure  Assessment:  Pulmonary fibrosis, interstitial lung disease CT shows pulmonary fibrosis in probable UIP pattern.  There are subtle changes back in 2016 and appears to have progressed since then.  There are no significant exposures or symptoms of autoimmune disease.  Titers are low which is likely nonsignificant as she does not have any evidence of lupus  PFTs reviewed with isolated diffusion defect with normal lung volumes and spirometry.  No evidence of pulmonary hypertension on echocardiogram  This is likely to be IPF and as she has progression on CT scan she is a candidate for antifibrotic therapy We discussed treatment options and she has decided to go with Ofev Recent hepatic panel is normal  Plan/Recommendations: Start therapy with Dondra Spry MD [facility] 02/26/2023, 3:29 PM  CC: Rudene Christians, DO   09/27/2022-

## 2023-03-01 DIAGNOSIS — M545 Low back pain, unspecified: Secondary | ICD-10-CM | POA: Diagnosis not present

## 2023-03-07 ENCOUNTER — Encounter: Payer: Self-pay | Admitting: Pulmonary Disease

## 2023-03-12 DIAGNOSIS — M5442 Lumbago with sciatica, left side: Secondary | ICD-10-CM | POA: Diagnosis not present

## 2023-03-15 ENCOUNTER — Other Ambulatory Visit: Payer: 59

## 2023-03-22 ENCOUNTER — Ambulatory Visit: Payer: 59 | Admitting: Student

## 2023-03-22 ENCOUNTER — Ambulatory Visit (HOSPITAL_COMMUNITY)
Admission: RE | Admit: 2023-03-22 | Discharge: 2023-03-22 | Disposition: A | Discharge status: Home / Self Care | Payer: 59 | Source: Ambulatory Visit | Attending: Internal Medicine | Admitting: Internal Medicine

## 2023-03-22 VITALS — BP 121/61 | HR 100 | Temp 97.8°F | Ht 68.5 in | Wt 210.0 lb

## 2023-03-22 DIAGNOSIS — R0603 Acute respiratory distress: Secondary | ICD-10-CM | POA: Insufficient documentation

## 2023-03-22 DIAGNOSIS — R918 Other nonspecific abnormal finding of lung field: Secondary | ICD-10-CM | POA: Diagnosis not present

## 2023-03-22 DIAGNOSIS — J849 Interstitial pulmonary disease, unspecified: Secondary | ICD-10-CM | POA: Insufficient documentation

## 2023-03-22 DIAGNOSIS — J209 Acute bronchitis, unspecified: Secondary | ICD-10-CM | POA: Diagnosis not present

## 2023-03-22 DIAGNOSIS — Z87891 Personal history of nicotine dependence: Secondary | ICD-10-CM | POA: Diagnosis not present

## 2023-03-22 DIAGNOSIS — J40 Bronchitis, not specified as acute or chronic: Secondary | ICD-10-CM

## 2023-03-22 DIAGNOSIS — J42 Unspecified chronic bronchitis: Secondary | ICD-10-CM | POA: Diagnosis not present

## 2023-03-22 DIAGNOSIS — J984 Other disorders of lung: Secondary | ICD-10-CM | POA: Diagnosis not present

## 2023-03-22 LAB — RESP PANEL BY RT-PCR (RSV, FLU A&B, COVID)  RVPGX2
Influenza A by PCR: NEGATIVE
Influenza B by PCR: NEGATIVE
Resp Syncytial Virus by PCR: NEGATIVE
SARS Coronavirus 2 by RT PCR: NEGATIVE

## 2023-03-22 NOTE — Patient Instructions (Signed)
Thank you, Ms.Ayan ALISHAH SCHULTE for allowing [place] to provide your care today.  I have ordered the following tests for you:  Chest Xray, Viral Swab for Covid, Flu and RSV    Follow up: 2 months for Routine follow up or earlier if things fail to improve or get worse   We look forward to seeing you next time. Please call our clinic at [phone] if you have any questions or concerns. The best time to call is Monday-Friday from 9am-4pm, but there is someone available 24/7. If after hours or the weekend, call the main hospital number and ask for the Internal Medicine Resident On-Call. If you need medication refills, please notify your pharmacy one week in advance and they will send [place] a request.   Thank you for trusting me with your care. Wishing you the best!  Lovie Macadamia MD [facility]

## 2023-03-22 NOTE — Assessment & Plan Note (Addendum)
Patient presented to our office today in the setting of ongoing cough over the last 3 weeks.  She says that she had cough, some fevers, clear sputum production, and dyspnea which started about 3 weeks ago.  Her sputum production has progressed to become more yellow-tinged recently.  She feels like she is gotten better over the last week, but last night had increased cough which prompted her visit today.  No orthopnea, hematemesis, or syncope.  The patient does have a known history of interstitial lung disease which she follows with pulmonology for.  She decided against antifibrotic therapy due to fear of side effects.  She has a upcoming chest CT scheduled to follow-up on her interstitial lung disease.  Chest x-ray read pending, though on my interpretation does not show overt consolidation or lobar pneumonia.  Perhaps some increased vascular markings and interval interstitial changes of the lungs.  RSV, COVID, and flu negative.  Signs and symptoms most consistent with viral infection and subsequent acute bronchitis.   Plan: We discussed supportive care measures Return precautions given in case of worsening symptoms.  Patient demonstrated understanding Will call patient to discuss results of swabs, chest x-ray

## 2023-03-22 NOTE — Progress Notes (Signed)
Subjective:  CC: Cough  HPI:  [name] is a 77 y.o. person with a past medical history stated below and presents today for 3-week history of cough. Please see problem based assessment and plan for additional details.  Past Medical History:  Diagnosis Date   Chronic back pain    "mostly lower; left foot is numb all the time" (11/22/2016)   Chronic kidney disease    ?renal cyst on MRI    GERD (gastroesophageal reflux disease)    High cholesterol    IPF (idiopathic pulmonary fibrosis) (HCC)    Lymphadenopathy    documented on chest CT scan 12/15/2005   Substance abuse (HCC)    tobacco    Current Outpatient Medications on File Prior to Visit  Medication Sig Dispense Refill   acetaminophen (TYLENOL) 500 MG tablet Take 2 tablets (1,000 mg total) by mouth every 8 (eight) hours as needed. 100 tablet 2   albuterol (VENTOLIN HFA) 108 (90 Base) MCG/ACT inhaler INHALE 1-2 PUFFS BY MOUTH EVERY 6 HOURS AS NEEDED FOR WHEEZE OR SHORTNESS OF BREATH 8.5 each 2   amLODipine (NORVASC) 10 MG tablet Take 1 tablet (10 mg total) by mouth daily. 90 tablet 6   aspirin 81 MG chewable tablet Chew 81 mg by mouth daily.     diclofenac Sodium (VOLTAREN) 1 % GEL Apply 4 g topically 4 (four) times daily. 4 g 3   fluticasone (FLONASE) 50 MCG/ACT nasal spray USE 1-2 SPRAYS IN EACH NOSTRIL DAILY 48 mL 3   gabapentin (NEURONTIN) 600 MG tablet Take 1 tablet (600 mg total) by mouth 4 (four) times daily. 120 tablet 3   GEMTESA 75 MG TABS Take 1 tablet by mouth daily.     HYDROcodone-acetaminophen (NORCO/VICODIN) 5-325 MG tablet Take 1 tablet by mouth every 4 (four) hours as needed. (Patient not taking: Reported on 02/26/2023) 15 tablet 0   metoprolol succinate (TOPROL-XL) 25 MG 24 hr tablet Take 25 mg by mouth daily.     pantoprazole (PROTONIX) 40 MG tablet TAKE 1 TABLET BY MOUTH EVERY DAY 90 tablet 2   rosuvastatin (CRESTOR) 10 MG tablet TAKE 1 TABLET BY MOUTH EVERY DAY 90 tablet 3   Sodium Chloride-Sodium  Bicarb (NETI POT SINUS WASH) 2300-700 MG KIT Place 1 kit into the nose daily. 1 kit 0   traMADol (ULTRAM) 50 MG tablet Take 50 mg by mouth 3 (three) times daily as needed.     No current facility-administered medications on file prior to visit.    Review of Systems: Please see assessment and plan for pertinent positives and negatives.  Objective:   Vitals:   03/22/23 1516  BP: 121/61  Pulse: 100  Temp: 97.8 F (36.6 C)  TempSrc: Oral  SpO2: 95%  Weight: 210 lb (95.3 kg)  Height: 5' 8.5" (1.74 m)    Physical Exam: Constitutional: Ill-appearing, in no acute distress Cardiovascular: regular rate and rhythm, no m/r/g Pulmonary/Chest: Fine crackles throughout the lungs bilaterally.  No wheezing, no rales, no rhonchi. Abdominal: soft, non-tender, non-distended Extremities: No edema of the lower extremities bilaterally Skin: warm and dry Psych: Appropriate mood and affect   Assessment & Plan:  Acute respiratory distress Patient presented to our office today in the setting of ongoing cough over the last 3 weeks.  She says that she had cough, some fevers, clear sputum production, and dyspnea which started about 3 weeks ago.  Her sputum production has progressed to become more yellow-tinged recently.  She feels like she  is gotten better over the last week, but last night had increased cough which prompted her visit today.  The patient does have a known history of interstitial lung disease which she follows with pulmonology for.  She decided against antifibrotic therapy due to fear of side effects.  She has a upcoming chest CT scheduled to follow-up on her interstitial lung disease.  Chest x-ray read pending, though on my interpretation does not show overt consolidation or lobar pneumonia.  Perhaps some increased vascular markings and interval interstitial changes of the lungs.  Signs and symptoms most consistent with viral infection and subsequent acute bronchitis.   Plan: We  discussed supportive care measures RSV, flu, COVID swabs pending Return precautions given in case of worsening symptoms.  Patient demonstrated understanding Will call patient to discuss results of swabs, chest x-ray when available    Patient seen with Dr. Elliot Cousin MD [facility] Internal Medicine  PGY-1 Pager: [phone]  Phone: [phone] Date 03/22/2023  Time 5:57 PM

## 2023-03-23 NOTE — Progress Notes (Signed)
I spoke with Laura [name] on the phone. Patient's identity was confirmed using two patient specific identifiers. We discussed her chest Xray - specifically no findings of over pneumonia, mass or infection. She is scheduled for Chest CT per pulmonology in the near future which will help categorize some of her other findings. Patient informed of this and demonstrated understanding.

## 2023-03-26 NOTE — Progress Notes (Signed)
Internal Medicine Clinic Attending  I was physically present during the key portions of the resident provided service and participated in the medical decision making of patient's management care. I reviewed pertinent patient test results.  The assessment, diagnosis, and plan were formulated together and I agree with the documentation in the resident's note.  Mercie Eon, MD

## 2023-03-27 DIAGNOSIS — M5416 Radiculopathy, lumbar region: Secondary | ICD-10-CM | POA: Diagnosis not present

## 2023-03-28 ENCOUNTER — Ambulatory Visit
Admission: RE | Admit: 2023-03-28 | Discharge: 2023-03-28 | Disposition: A | Discharge status: Home / Self Care | Payer: 59 | Source: Ambulatory Visit | Attending: Pulmonary Disease

## 2023-03-28 DIAGNOSIS — J439 Emphysema, unspecified: Secondary | ICD-10-CM | POA: Diagnosis not present

## 2023-03-28 DIAGNOSIS — J849 Interstitial pulmonary disease, unspecified: Secondary | ICD-10-CM

## 2023-03-28 DIAGNOSIS — I700 Atherosclerosis of aorta: Secondary | ICD-10-CM | POA: Diagnosis not present

## 2023-04-04 ENCOUNTER — Encounter: Payer: 59 | Admitting: Student

## 2023-04-04 NOTE — Progress Notes (Deleted)
77 year old female with HTN, IPF,  01/01/2023 She is unsure about taking medication to prevent progression of IPF. From office visit with pulmonology, her understanding is that the scarring is minimal. She has some dyspnea when she walks for long distances but otherwise feels well. She read the information about nintedanib and is concerned as it mentions having to have frequent follow-up for liver functions testing. We talked about indication for medication to stop progression of disease which she understands. She has also talked with Dr. Sharyn Lull, a cardiologist, about medication. She has requested that pulmonology group stop medication from being sent to her house for now. She reports difficulty with reaching someone at their office to arrange follow-up to talk about medication. P: Smoking cessation encouraged. She is smoking about 3-4 cigarettes daily. She is not currently interested  in NRT or chantix. She will think about how she would like to approach this and let me know if she want medication sent in for smoking cessation.   Message sent to Dr. Isaiah Serge to see if follow-up appointment can be made, she is concerned about medication but also seems worried about risk for progression.  Followed up with Dr. Isaiah Serge 02/26/2023-decided to go to [facility]  03/22/2023 seen for subacute cough, COVID/flu/RSV negative

## 2023-04-15 ENCOUNTER — Other Ambulatory Visit: Payer: Self-pay | Admitting: Internal Medicine

## 2023-04-15 DIAGNOSIS — K219 Gastro-esophageal reflux disease without esophagitis: Secondary | ICD-10-CM

## 2023-04-16 NOTE — Telephone Encounter (Signed)
Next appt scheduled 11/15 with Dr Sloan Leiter.

## 2023-05-02 ENCOUNTER — Ambulatory Visit: Payer: 59

## 2023-05-02 VITALS — Ht 68.5 in | Wt 205.0 lb

## 2023-05-02 DIAGNOSIS — Z0000 Encounter for general adult medical examination without abnormal findings: Secondary | ICD-10-CM | POA: Diagnosis not present

## 2023-05-02 DIAGNOSIS — M5416 Radiculopathy, lumbar region: Secondary | ICD-10-CM | POA: Diagnosis not present

## 2023-05-02 DIAGNOSIS — E782 Mixed hyperlipidemia: Secondary | ICD-10-CM | POA: Diagnosis not present

## 2023-05-02 DIAGNOSIS — I10 Essential (primary) hypertension: Secondary | ICD-10-CM | POA: Diagnosis not present

## 2023-05-02 DIAGNOSIS — J849 Interstitial pulmonary disease, unspecified: Secondary | ICD-10-CM | POA: Diagnosis not present

## 2023-05-02 NOTE — Patient Instructions (Signed)
Laura [name] , Thank you for taking time to come for your Medicare Wellness Visit. I appreciate your ongoing commitment to your health goals. Please review the following plan we discussed and let me know if I can assist you in the future.   Referrals/Orders/Follow-Ups/Clinician Recommendations: You are due for a Flu vaccine and a Covid vaccine.  Remember to ask provider about the Hep C screening.  You are due for another colonoscopy and also a Bone Density screening.  It was nice talking to you today.  This is a list of the screening recommended for you and due dates:  Health Maintenance  Topic Date Due   Hepatitis C Screening  Never done   COVID-19 Vaccine (3 - Pfizer risk series) 11/03/2019   Colon Cancer Screening  08/17/2020   Zoster (Shingles) Vaccine (1 of 2) 08/02/2023*   Flu Shot  09/17/2023*   Mammogram  07/14/2023   DTaP/Tdap/Td vaccine (2 - Td or Tdap) 03/16/2024   Screening for Lung Cancer  03/27/2024   Medicare Annual Wellness Visit  05/01/2024   Pneumonia Vaccine  Completed   DEXA scan (bone density measurement)  Completed   HPV Vaccine  Aged Out  *Topic was postponed. The date shown is not the original due date.    Advanced directives: (Copy Requested) Please bring a copy of your health care power of attorney and living will to the office to be added to your chart at your convenience.  Next Medicare Annual Wellness Visit scheduled for next year: Yes

## 2023-05-02 NOTE — Progress Notes (Signed)
Subjective:   Laura [name] is a 77 y.o. female who presents for Medicare Annual (Subsequent) preventive examination.  Visit Complete: Virtual I connected with  Meryl Crutch on 05/02/23 by a audio enabled telemedicine application and verified that I am speaking with the correct person using two identifiers.  Patient Location: Home  Provider Location: Home Office  I discussed the limitations of evaluation and management by telemedicine. The patient expressed understanding and agreed to proceed.  Vital Signs: Because this visit was a virtual/telehealth visit, some criteria may be missing or patient reported. Any vitals not documented were not able to be obtained and vitals that have been documented are patient reported.   Cardiac Risk Factors include: advanced age (>26men, >40 women);hypertension;dyslipidemia     Objective:    Today's Vitals   05/02/23 1116 05/02/23 1118  Weight: 205 lb (93 kg)   Height: 5' 8.5" (1.74 m)   PainSc:  7    Body mass index is 30.72 kg/m.     05/02/2023   11:26 AM 03/22/2023    3:18 PM 01/01/2023    2:43 PM 12/01/2022    7:40 PM 10/12/2022    3:17 PM 05/17/2022    1:28 PM 05/04/2022   10:47 AM  Advanced Directives  Does Patient Have a Medical Advance Directive? No No No No No No No  Would patient like information on creating a medical advance directive?  No - Patient declined No - Patient declined  No - Patient declined Yes (ED - Information included in AVS) No - Patient declined    Current Medications (verified) Outpatient Encounter Medications as of 05/02/2023  Medication Sig   acetaminophen (TYLENOL) 500 MG tablet Take 2 tablets (1,000 mg total) by mouth every 8 (eight) hours as needed.   albuterol (VENTOLIN HFA) 108 (90 Base) MCG/ACT inhaler INHALE 1-2 PUFFS BY MOUTH EVERY 6 HOURS AS NEEDED FOR WHEEZE OR SHORTNESS OF BREATH   aspirin 81 MG chewable tablet Chew 81 mg by mouth daily.   diclofenac Sodium (VOLTAREN) 1 % GEL Apply 4 g  topically 4 (four) times daily.   fluticasone (FLONASE) 50 MCG/ACT nasal spray USE 1-2 SPRAYS IN EACH NOSTRIL DAILY   GEMTESA 75 MG TABS Take 1 tablet by mouth daily.   metoprolol succinate (TOPROL-XL) 25 MG 24 hr tablet Take 25 mg by mouth daily.   pantoprazole (PROTONIX) 40 MG tablet TAKE 1 TABLET BY MOUTH EVERY DAY   rosuvastatin (CRESTOR) 10 MG tablet TAKE 1 TABLET BY MOUTH EVERY DAY   Sodium Chloride-Sodium Bicarb (NETI POT SINUS WASH) 2300-700 MG KIT Place 1 kit into the nose daily.   traMADol (ULTRAM) 50 MG tablet Take 50 mg by mouth 3 (three) times daily as needed.   amLODipine (NORVASC) 10 MG tablet Take 1 tablet (10 mg total) by mouth daily.   gabapentin (NEURONTIN) 600 MG tablet Take 1 tablet (600 mg total) by mouth 4 (four) times daily.   HYDROcodone-acetaminophen (NORCO/VICODIN) 5-325 MG tablet Take 1 tablet by mouth every 4 (four) hours as needed. (Patient not taking: Reported on 02/26/2023)   No facility-administered encounter medications on file as of 05/02/2023.    Allergies (verified) Patient has no known allergies.   History: Past Medical History:  Diagnosis Date   Chronic back pain    "mostly lower; left foot is numb all the time" (11/22/2016)   Chronic kidney disease    ?renal cyst on MRI    GERD (gastroesophageal reflux disease)    High cholesterol  IPF (idiopathic pulmonary fibrosis) (HCC)    Lymphadenopathy    documented on chest CT scan 12/15/2005   Substance abuse (HCC)    tobacco   Past Surgical History:  Procedure Laterality Date   ANTERIOR CERVICAL DISCECTOMY     C3-4, C4-5 diskectomy, fusion, plating and allograft,    BACK SURGERY     CARPAL TUNNEL WITH CUBITAL TUNNEL Left    LUMBAR DISC SURGERY  X 2-3   POSTERIOR CERVICAL FUSION/FORAMINOTOMY     C3-5 fusion with wirin   POSTERIOR LUMBAR FUSION     SPINE SURGERY     s/p C3-4, C4-5 diskectomy, fusion, plating and allograft, s/p posterior C3-5 fusion with wiring(02/11/2007 by Dr Ophelia Charter) had  psudoarthrosis lumbar surgery 2000)   Family History  Problem Relation Age of Onset   Diabetes Father    Hypertension Father    Social History   Socioeconomic History   Marital status: Divorced    Spouse name: Not on file   Number of children: 0   Years of education: Not on file   Highest education level: Not on file  Occupational History    Employer: UNEMPLOYED   Occupation: Disabled/Retired  Tobacco Use   Smoking status: Former    Current packs/day: 0.00    Average packs/day: 0.5 packs/day for 47.0 years (23.5 ttl pk-yrs)    Types: Cigarettes    Start date: 05/13/1973    Quit date: 05/13/2020    Years since quitting: 2.9   Smokeless tobacco: Never   Tobacco comments:    stopped around Thanksgiving   Vaping Use   Vaping status: Never Used  Substance and Sexual Activity   Alcohol use: Yes    Alcohol/week: 0.0 standard drinks of alcohol    Comment: 11/22/2016 "2 drinks q other holiday"   Drug use: Not Currently    Types: Marijuana    Comment: 66/2018 "I stopped in the 1970s"   Sexual activity: Yes  Other Topics Concern   Not on file  Social History Narrative   Patient's new Phone # [phone] and [phone].   Lives alone.   Social Determinants of Health   Financial Resource Strain: Low Risk  (05/02/2023)   Overall Financial Resource Strain (CARDIA)    Difficulty of Paying Living Expenses: Not hard at all  Food Insecurity: No Food Insecurity (05/02/2023)   Hunger Vital Sign    Worried About Running Out of Food in the Last Year: Never true    Ran Out of Food in the Last Year: Never true  Transportation Needs: No Transportation Needs (05/02/2023)   PRAPARE - Administrator, Civil Service (Medical): No    Lack of Transportation (Non-Medical): No  Physical Activity: Inactive (05/02/2023)   Exercise Vital Sign    Days of Exercise per Week: 0 days    Minutes of Exercise per Session: 0 min  Stress: No Stress Concern Present (05/02/2023)   [employer]  of Occupational Health - Occupational Stress Questionnaire    Feeling of Stress : Not at all  Social Connections: Moderately Isolated (05/02/2023)   Social Connection and Isolation Panel [NHANES]    Frequency of Communication with Friends and Family: More than three times a week    Frequency of Social Gatherings with Friends and Family: More than three times a week    Attends Religious Services: More than 4 times per year    Active Member of [employer] or Organizations: No    Attends Banker Meetings: Never  Marital Status: Divorced    Tobacco Counseling Counseling given: Not Answered Tobacco comments: stopped around Thanksgiving    Clinical Intake:  Pre-visit preparation completed: Yes  Pain : 0-10 Pain Score: 7  Pain Type: Acute pain, Chronic pain Pain Location: Back Pain Orientation: Lower Pain Descriptors / Indicators: Aching, Discomfort, Constant Pain Onset: More than a month ago Pain Frequency: Constant Pain Relieving Factors: Tramadol Effect of Pain on Daily Activities: With movment  Pain Relieving Factors: Tramadol  BMI - recorded: 30.72 Nutritional Status: BMI > 30  Obese Nutritional Risks: None Diabetes: No  How often do you need to have someone help you when you read instructions, pamphlets, or other written materials from your doctor or pharmacy?: 1 - Never  Interpreter Needed?: No  Information entered by :: Nuel Dejaynes, RMA   Activities of Daily Living    05/02/2023   11:20 AM 03/22/2023    3:17 PM  In your present state of health, do you have any difficulty performing the following activities:  Hearing? 0 0  Vision? 0 0  Difficulty concentrating or making decisions? 0 0  Walking or climbing stairs? 1 0  Dressing or bathing? 1 0  Comment pt has an aide 7 days a week   Doing errands, shopping? 0 0  Comment family member takes her and she drivers sometimes   Quarry manager and eating ? N   Using the Toilet? N   In the past six  months, have you accidently leaked urine? Y   Comment wears depends over night   Do you have problems with loss of bowel control? N   Managing your Medications? N   Managing your Finances? N   Housekeeping or managing your Housekeeping? N     Patient Care Team: Masters, Florentina Addison, DO as PCP - General Mateo Flow, MD as Consulting Physician (Ophthalmology)  Indicate any recent Medical Services you may have received from other than [facility] providers in the past year (date may be approximate).     Assessment:   This is a routine wellness examination for Rolette.  Hearing/Vision screen Hearing Screening - Comments:: Denies hearing difficulties   Vision Screening - Comments:: Wears eyeglasses   Goals Addressed               This Visit's Progress     Patient Stated (pt-stated)        Stay healthy, want to start walking more, keep mobile.      Depression Screen    05/02/2023   11:33 AM 01/01/2023    2:44 PM 05/17/2022    1:27 PM 05/04/2022   10:47 AM 05/04/2022    8:44 AM 02/14/2022    9:21 AM 10/11/2021   10:04 AM  PHQ 2/9 Scores  PHQ - 2 Score 0 0 0 0 0 0 0  PHQ- 9 Score 0  0 1 1      Fall Risk    05/02/2023   11:26 AM 03/22/2023    3:17 PM 01/01/2023    2:42 PM 10/12/2022    3:14 PM 05/17/2022    1:26 PM  Fall Risk   Falls in the past year? 1 1 1  0 0  Number falls in past yr: 0 0 0 0 0  Injury with Fall? 1 1 1  0 0  Comment fractured pelvic      Risk for fall due to :     No Fall Risks  Follow up Falls evaluation completed;Falls prevention discussed Falls evaluation completed Falls  evaluation completed Falls evaluation completed Falls evaluation completed;Falls prevention discussed    MEDICARE RISK AT HOME: Medicare Risk at Home Any stairs in or around the home?: No Home free of loose throw rugs in walkways, pet beds, electrical cords, etc?: Yes Adequate lighting in your home to reduce risk of falls?: Yes Life alert?: No Use of a cane, walker or w/c?:  Yes Grab bars in the bathroom?: Yes Shower chair or bench in shower?: Yes Elevated toilet seat or a handicapped toilet?: Yes  TIMED UP AND GO:  Was the test performed?  No    Cognitive Function:        05/02/2023   11:28 AM 05/04/2022   10:47 AM  6CIT Screen  What Year? 0 points 0 points  What month? 0 points 0 points  What time? 0 points 0 points  Count back from 20 2 points 0 points  Months in reverse 0 points 0 points  Repeat phrase 0 points 0 points  Total Score 2 points 0 points    Immunizations Immunization History  Administered Date(s) Administered   Fluad Quad(high Dose 65+) 04/05/2021, 05/04/2022   Influenza Whole 05/11/2009   Influenza,inj,Quad PF,6+ Mos 08/04/2013, 03/16/2014, 03/28/2017, 03/11/2018   Influenza,inj,quad, With Preservative 03/19/2017   PFIZER(Purple Top)SARS-COV-2 Vaccination 09/11/2019, 10/06/2019   Pneumococcal Conjugate-13 08/31/2014   Pneumococcal Polysaccharide-23 08/04/2013   Tdap 03/16/2014    TDAP status: Up to date  Flu Vaccine status: Due, Education has been provided regarding the importance of this vaccine. Advised may receive this vaccine at local pharmacy or Health Dept. Aware to provide a copy of the vaccination record if obtained from local pharmacy or Health Dept. Verbalized acceptance and understanding.  Pneumococcal vaccine status: Up to date  Covid-19 vaccine status: Information provided on how to obtain vaccines.   Qualifies for Shingles Vaccine? Yes   Zostavax completed No   Shingrix Completed?: No.    Education has been provided regarding the importance of this vaccine. Patient has been advised to call insurance company to determine out of pocket expense if they have not yet received this vaccine. Advised may also receive vaccine at local pharmacy or Health Dept. Verbalized acceptance and understanding.  Screening Tests Health Maintenance  Topic Date Due   Hepatitis C Screening  Never done   COVID-19 Vaccine (3 -  Pfizer risk series) 11/03/2019   Colonoscopy  08/17/2020   Zoster Vaccines- Shingrix (1 of 2) 08/02/2023 (Originally 04/03/1965)   INFLUENZA VACCINE  09/17/2023 (Originally 01/18/2023)   MAMMOGRAM  07/14/2023   DTaP/Tdap/Td (2 - Td or Tdap) 03/16/2024   Lung Cancer Screening  03/27/2024   Medicare Annual Wellness (AWV)  05/01/2024   Pneumonia Vaccine 55+ Years old  Completed   DEXA SCAN  Completed   HPV VACCINES  Aged Out    Health Maintenance  Health Maintenance Due  Topic Date Due   Hepatitis C Screening  Never done   COVID-19 Vaccine (3 - Pfizer risk series) 11/03/2019   Colonoscopy  08/17/2020    Colorectal cancer screening: Type of screening: Colonoscopy. Completed 08/17/2017. Repeat every 3 years  Mammogram status: Completed 01/025/2024. Repeat every year  Bone Density status: Ordered 05/02/2023. Pt provided with contact info and advised to call to schedule appt.  Lung Cancer Screening: (Low Dose CT Chest recommended if Age 78-80 years, 20 pack-year currently smoking OR have quit w/in 15years.) does qualify.   Lung Cancer Screening Referral: 03/22/2023  Additional Screening:  Hepatitis C Screening: does qualify;   Vision Screening:  Recommended annual ophthalmology exams for early detection of glaucoma and other disorders of the eye. Is the patient up to date with their annual eye exam?  Yes  Who is the provider or what is the name of the office in which the patient attends annual eye exams? Dr. Benjamine Mola at [facility] office. If pt is not established with a provider, would they like to be referred to a provider to establish care? No .   Dental Screening: Recommended annual dental exams for proper oral hygiene   Community Resource Referral / Chronic Care Management: CRR required this visit?  No   CCM required this visit?  No     Plan:     I have personally reviewed and noted the following in the patient's chart:   Medical and social history Use of alcohol, tobacco  or illicit drugs  Current medications and supplements including opioid prescriptions. Patient is not currently taking opioid prescriptions. Functional ability and status Nutritional status Physical activity Advanced directives List of other physicians Hospitalizations, surgeries, and ER visits in previous 12 months Vitals Screenings to include cognitive, depression, and falls Referrals and appointments  In addition, I have reviewed and discussed with patient certain preventive protocols, quality metrics, and best practice recommendations. A written personalized care plan for preventive services as well as general preventive health recommendations were provided to patient.     Atalia Litzinger L Jahquan Klugh, CMA   05/02/2023   After Visit Summary: (Mail) Due to this being a telephonic visit, the after visit summary with patients personalized plan was offered to patient via mail   Nurse Notes: Patient is due for a Flu vaccine, which she would like to receive during her office visit tomorrow.  She is also due for a Covid vaccine and over due for a colonoscopy, as well as a DEXA.  Patient has never had a Hep C screening.  She had no other concerns to address today.

## 2023-05-03 DIAGNOSIS — M25552 Pain in left hip: Secondary | ICD-10-CM | POA: Diagnosis not present

## 2023-05-04 ENCOUNTER — Ambulatory Visit (INDEPENDENT_AMBULATORY_CARE_PROVIDER_SITE_OTHER): Payer: 59 | Admitting: Internal Medicine

## 2023-05-04 ENCOUNTER — Encounter: Payer: Self-pay | Admitting: Internal Medicine

## 2023-05-04 VITALS — BP 125/88 | HR 89 | Ht 68.0 in | Wt 211.1 lb

## 2023-05-04 DIAGNOSIS — J849 Interstitial pulmonary disease, unspecified: Secondary | ICD-10-CM | POA: Diagnosis not present

## 2023-05-04 DIAGNOSIS — E785 Hyperlipidemia, unspecified: Secondary | ICD-10-CM | POA: Diagnosis not present

## 2023-05-04 DIAGNOSIS — Z23 Encounter for immunization: Secondary | ICD-10-CM

## 2023-05-04 DIAGNOSIS — S32592D Other specified fracture of left pubis, subsequent encounter for fracture with routine healing: Secondary | ICD-10-CM

## 2023-05-04 DIAGNOSIS — Z720 Tobacco use: Secondary | ICD-10-CM | POA: Diagnosis not present

## 2023-05-04 DIAGNOSIS — R7303 Prediabetes: Secondary | ICD-10-CM

## 2023-05-04 DIAGNOSIS — J84112 Idiopathic pulmonary fibrosis: Secondary | ICD-10-CM | POA: Diagnosis not present

## 2023-05-04 DIAGNOSIS — I10 Essential (primary) hypertension: Secondary | ICD-10-CM

## 2023-05-04 DIAGNOSIS — E7800 Pure hypercholesterolemia, unspecified: Secondary | ICD-10-CM | POA: Diagnosis not present

## 2023-05-04 DIAGNOSIS — Z860100 Personal history of colon polyps, unspecified: Secondary | ICD-10-CM

## 2023-05-04 DIAGNOSIS — I252 Old myocardial infarction: Secondary | ICD-10-CM | POA: Insufficient documentation

## 2023-05-04 DIAGNOSIS — Z0000 Encounter for general adult medical examination without abnormal findings: Secondary | ICD-10-CM | POA: Insufficient documentation

## 2023-05-04 LAB — POCT GLYCOSYLATED HEMOGLOBIN (HGB A1C): Hemoglobin A1C: 5.7 % — AB (ref 4.0–5.6)

## 2023-05-04 LAB — GLUCOSE, CAPILLARY: Glucose-Capillary: 104 mg/dL — ABNORMAL HIGH (ref 70–99)

## 2023-05-04 NOTE — Assessment & Plan Note (Addendum)
For secondary prevention with history of MI. Adherent with crestor 10 mg.  P: Lipid panel  Addendum 11/22: LDL at 82, I called and talked with patient. She was open to increasing crestor back to 20 mg. She does not recall having side effects previously.

## 2023-05-04 NOTE — Assessment & Plan Note (Signed)
Repeat CT showed indeterminate findings for UIP. She is waiting on call from pulmonology about results. She is not interested in antifibrotic therapy. Most importantly she has successfully stopped smoking for the last few weeks which is a goal we talked about in July. -encourage continued smoking cessation.

## 2023-05-04 NOTE — Assessment & Plan Note (Signed)
Reports cessation for the last 2-3 weeks. She did not use anything to help with cessation.

## 2023-05-04 NOTE — Assessment & Plan Note (Signed)
Flu shot

## 2023-05-04 NOTE — Assessment & Plan Note (Signed)
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 Follows with Dr. Sharyn Lull who is requesting BMP, lipid panel and hepatic function panel. -labs completed in office

## 2023-05-04 NOTE — Assessment & Plan Note (Signed)
Lab Results  Component Value Date   HGBA1C 5.7 (A) 05/04/2023   A1c improved from prior. We reviewed lifestyle modifications. She continues to recover form fall earlier this year and is limited with mobility.

## 2023-05-04 NOTE — Progress Notes (Addendum)
Subjective:  CC: chronic conditions, labs for cardiology  HPI:  [name] is a 77 y.o. female with a past medical history stated below and presents today for lab evaluation for HTN, HLD, and pre-diabetes. She is waiting on imaging results from repeat CT chest. She remains firm with decision to not take anti-fibrotic medication for ILD. She stopped smoking cigarettes since last office visit. She denies shortness of breath.   Please see problem based assessment and plan for additional details.  Past Medical History:  Diagnosis Date   Chronic back pain    "mostly lower; left foot is numb all the time" (11/22/2016)   Chronic kidney disease    ?renal cyst on MRI    GERD (gastroesophageal reflux disease)    High cholesterol    IPF (idiopathic pulmonary fibrosis) (HCC)    Lymphadenopathy    documented on chest CT scan 12/15/2005   Substance abuse (HCC)    tobacco    Current Outpatient Medications on File Prior to Visit  Medication Sig Dispense Refill   MEDROL 4 MG TBPK tablet Take by mouth as directed.     acetaminophen (TYLENOL) 500 MG tablet Take 2 tablets (1,000 mg total) by mouth every 8 (eight) hours as needed. 100 tablet 2   albuterol (VENTOLIN HFA) 108 (90 Base) MCG/ACT inhaler INHALE 1-2 PUFFS BY MOUTH EVERY 6 HOURS AS NEEDED FOR WHEEZE OR SHORTNESS OF BREATH 8.5 each 2   amLODipine (NORVASC) 10 MG tablet Take 1 tablet (10 mg total) by mouth daily. 90 tablet 6   aspirin 81 MG chewable tablet Chew 81 mg by mouth daily.     diclofenac Sodium (VOLTAREN) 1 % GEL Apply 4 g topically 4 (four) times daily. 4 g 3   fluticasone (FLONASE) 50 MCG/ACT nasal spray USE 1-2 SPRAYS IN EACH NOSTRIL DAILY 48 mL 3   gabapentin (NEURONTIN) 600 MG tablet Take 1 tablet (600 mg total) by mouth 4 (four) times daily. 120 tablet 3   GEMTESA 75 MG TABS Take 1 tablet by mouth daily.     metoprolol succinate (TOPROL-XL) 25 MG 24 hr tablet Take 25 mg by mouth daily.     pantoprazole (PROTONIX)  40 MG tablet TAKE 1 TABLET BY MOUTH EVERY DAY 90 tablet 2   Sodium Chloride-Sodium Bicarb (NETI POT SINUS WASH) 2300-700 MG KIT Place 1 kit into the nose daily. 1 kit 0   traMADol (ULTRAM) 50 MG tablet Take 50 mg by mouth 3 (three) times daily as needed.     No current facility-administered medications on file prior to visit.    Family History  Problem Relation Age of Onset   Diabetes Father    Hypertension Father     Social History   Socioeconomic History   Marital status: Divorced    Spouse name: Not on file   Number of children: 0   Years of education: Not on file   Highest education level: Not on file  Occupational History    Employer: UNEMPLOYED   Occupation: Disabled/Retired  Tobacco Use   Smoking status: Former    Current packs/day: 0.00    Average packs/day: 0.5 packs/day for 47.0 years (23.5 ttl pk-yrs)    Types: Cigarettes    Start date: 05/13/1973    Quit date: 05/13/2020    Years since quitting: 2.9   Smokeless tobacco: Never   Tobacco comments:    stopped around Thanksgiving   Vaping Use   Vaping status: Never Used  Substance and  Sexual Activity   Alcohol use: Yes    Alcohol/week: 0.0 standard drinks of alcohol    Comment: 11/22/2016 "2 drinks q other holiday"   Drug use: Not Currently    Types: Marijuana    Comment: 66/2018 "I stopped in the 1970s"   Sexual activity: Yes  Other Topics Concern   Not on file  Social History Narrative   Patient's new Phone # [phone] and [phone].   Lives alone.   Social Determinants of Health   Financial Resource Strain: Low Risk  (05/02/2023)   Overall Financial Resource Strain (CARDIA)    Difficulty of Paying Living Expenses: Not hard at all  Food Insecurity: No Food Insecurity (05/02/2023)   Hunger Vital Sign    Worried About Running Out of Food in the Last Year: Never true    Ran Out of Food in the Last Year: Never true  Transportation Needs: No Transportation Needs (05/02/2023)   PRAPARE - Therapist, art (Medical): No    Lack of Transportation (Non-Medical): No  Physical Activity: Inactive (05/02/2023)   Exercise Vital Sign    Days of Exercise per Week: 0 days    Minutes of Exercise per Session: 0 min  Stress: No Stress Concern Present (05/02/2023)   [employer] of Occupational Health - Occupational Stress Questionnaire    Feeling of Stress : Not at all  Social Connections: Moderately Isolated (05/02/2023)   Social Connection and Isolation Panel [NHANES]    Frequency of Communication with Friends and Family: More than three times a week    Frequency of Social Gatherings with Friends and Family: More than three times a week    Attends Religious Services: More than 4 times per year    Active Member of [employer] or Organizations: No    Attends Banker Meetings: Never    Marital Status: Divorced  Catering manager Violence: Patient Unable To Answer (05/02/2023)   Humiliation, Afraid, Rape, and Kick questionnaire    Fear of Current or Ex-Partner: Patient unable to answer    Emotionally Abused: Patient unable to answer    Physically Abused: Patient unable to answer    Sexually Abused: Patient unable to answer    Review of Systems: ROS negative except for what is noted on the assessment and plan.  Objective:   Vitals:   05/04/23 0917  BP: 125/88  Pulse: 89  SpO2: 98%  Weight: 211 lb 1.6 oz (95.8 kg)  Height: 5\' 8"  (1.727 m)    Physical Exam: Constitutional: well-appearing  Cardiovascular: regular rate and rhythm, no m/r/g Pulmonary/Chest: normal work of breathing on room air, lungs clear to auscultation bilaterally Abdominal: soft, non-tender, non-distended Neurological: alert & oriented x 3, in wheelchair Skin: warm and dry  Assessment & Plan:  Tobacco use Reports cessation for the last 2-3 weeks. She did not use anything to help with cessation.   Pre-diabetes Lab Results  Component Value Date   HGBA1C 5.7 (A) 05/04/2023   A1c  improved from prior. We reviewed lifestyle modifications. She continues to recover form fall earlier this year and is limited with mobility.   IPF (idiopathic pulmonary fibrosis) (HCC) Repeat CT showed indeterminate findings for UIP. She is waiting on call from pulmonology about results. She is not interested in antifibrotic therapy. Most importantly she has successfully stopped smoking for the last few weeks which is a goal we talked about in July. -encourage continued smoking cessation.  HTN (hypertension) BP remains well controlled on amlodipine  10 mg P Repeat annual BMP  HLD (hyperlipidemia) For secondary prevention with history of MI. Adherent with crestor 10 mg.  P: Lipid panel  Addendum 11/22: LDL at 82, I called and talked with patient. She was open to increasing crestor back to 20 mg. She does not recall having side effects previously.   History of acute myocardial infarction Stress test in 2019 showed infarct to apex. Follows with Dr. Sharyn Lull who is requesting BMP, lipid panel and hepatic function panel. -labs completed in office  Healthcare maintenance Flu shot   Patient discussed with Dr. Cleda Daub   Rhonda Linan, D.O. [facility]  PGY-3 Pager: [phone]  Phone: [phone] Date 05/11/2023  Time 10:43 AM

## 2023-05-04 NOTE — Assessment & Plan Note (Signed)
BP remains well controlled on amlodipine 10 mg P Repeat annual BMP

## 2023-05-04 NOTE — Patient Instructions (Signed)
Thank you, Ms.Karli TRENICE AHSAN for allowing [place] to provide your care today.   I am checking 2/3 labs Dr. Sharyn Lull requested. I will call with results  Pre-diabetes A1c was5.7 which is good/  I am sending a message to pulm. GREAT WORK with stopping smoking.  I have ordered the following labs for you:   Lab Orders         POC Hbg A1C      I have ordered the following medication/changed the following medications:   Stop the following medications: There are no discontinued medications.   Start the following medications: No orders of the defined types were placed in this encounter.    Follow up: 6 months   We look forward to seeing you next time. Please call our clinic at [phone] if you have any questions or concerns. The best time to call is Monday-Friday from 9am-4pm, but there is someone available 24/7. If after hours or the weekend, call the main hospital number and ask for the Internal Medicine Resident On-Call. If you need medication refills, please notify your pharmacy one week in advance and they will send [place] a request.   Thank you for trusting me with your care. Wishing you the best!   Rudene Christians, DO [facility]

## 2023-05-05 LAB — HEPATIC FUNCTION PANEL
ALT: 9 [IU]/L (ref 0–32)
AST: 13 [IU]/L (ref 0–40)
Albumin: 3.5 g/dL — ABNORMAL LOW (ref 3.8–4.8)
Alkaline Phosphatase: 103 [IU]/L (ref 44–121)
Bilirubin Total: 0.3 mg/dL (ref 0.0–1.2)
Bilirubin, Direct: 0.14 mg/dL (ref 0.00–0.40)
Total Protein: 7 g/dL (ref 6.0–8.5)

## 2023-05-05 LAB — BMP8+ANION GAP
Anion Gap: 14 mmol/L (ref 10.0–18.0)
BUN/Creatinine Ratio: 22 (ref 12–28)
BUN: 22 mg/dL (ref 8–27)
CO2: 22 mmol/L (ref 20–29)
Calcium: 9.1 mg/dL (ref 8.7–10.3)
Chloride: 107 mmol/L — ABNORMAL HIGH (ref 96–106)
Creatinine, Ser: 1 mg/dL (ref 0.57–1.00)
Glucose: 94 mg/dL (ref 70–99)
Potassium: 4.2 mmol/L (ref 3.5–5.2)
Sodium: 143 mmol/L (ref 134–144)
eGFR: 59 mL/min/{1.73_m2} — ABNORMAL LOW (ref >59)

## 2023-05-05 LAB — LIPID PANEL
Chol/HDL Ratio: 4 ratio (ref 0.0–4.4)
Cholesterol, Total: 131 mg/dL (ref 100–199)
HDL: 33 mg/dL — ABNORMAL LOW (ref >39)
LDL Chol Calc (NIH): 85 mg/dL (ref 0–99)
Triglycerides: 59 mg/dL (ref 0–149)
VLDL Cholesterol Cal: 13 mg/dL (ref 5–40)

## 2023-05-07 NOTE — Progress Notes (Signed)
Internal Medicine Clinic Attending  Case discussed with the resident at the time of the visit.  We reviewed the resident's history and exam and pertinent patient test results.  I agree with the assessment, diagnosis, and plan of care documented in the resident's note.  

## 2023-05-07 NOTE — Addendum Note (Signed)
Addended by: Carlynn Purl C on: 05/07/2023 11:54 AM   Modules accepted: Level of Service

## 2023-05-09 ENCOUNTER — Encounter: Payer: Self-pay | Admitting: *Deleted

## 2023-05-09 NOTE — Progress Notes (Signed)
   Faxed a copy of lab results from 05-04-2023 [facility] office visit  to Dr Tyler Aas. (BMP, Lipid, Hepatic Function Panel and POCT A1c)  Fax [phone], 05-09-23 at 15:08  Alric Quan, PBT [facility] Lab 05-09-23  15:17

## 2023-05-10 DIAGNOSIS — M5416 Radiculopathy, lumbar region: Secondary | ICD-10-CM | POA: Diagnosis not present

## 2023-05-11 MED ORDER — ROSUVASTATIN CALCIUM 20 MG PO TABS
20.0000 mg | ORAL_TABLET | Freq: Every day | ORAL | 3 refills | Status: DC
Start: 2023-05-11 — End: 2024-01-03

## 2023-05-11 NOTE — Addendum Note (Signed)
Addended by: Lucille Passy on: 05/11/2023 10:43 AM   Modules accepted: Orders

## 2023-06-18 ENCOUNTER — Other Ambulatory Visit: Payer: Self-pay | Admitting: Internal Medicine

## 2023-06-18 DIAGNOSIS — M961 Postlaminectomy syndrome, not elsewhere classified: Secondary | ICD-10-CM

## 2023-06-21 ENCOUNTER — Telehealth: Payer: Self-pay | Admitting: Internal Medicine

## 2023-06-21 DIAGNOSIS — M961 Postlaminectomy syndrome, not elsewhere classified: Secondary | ICD-10-CM

## 2023-06-21 MED ORDER — GABAPENTIN 600 MG PO TABS
600.0000 mg | ORAL_TABLET | Freq: Four times a day (QID) | ORAL | 3 refills | Status: DC
Start: 2023-06-21 — End: 2023-11-15

## 2023-06-21 NOTE — Telephone Encounter (Signed)
 Sent in refill of patient's gabapentin. She takes 600 mg QID.

## 2023-07-05 DIAGNOSIS — H401131 Primary open-angle glaucoma, bilateral, mild stage: Secondary | ICD-10-CM | POA: Diagnosis not present

## 2023-07-05 DIAGNOSIS — H04123 Dry eye syndrome of bilateral lacrimal glands: Secondary | ICD-10-CM | POA: Diagnosis not present

## 2023-07-05 DIAGNOSIS — H532 Diplopia: Secondary | ICD-10-CM | POA: Diagnosis not present

## 2023-07-05 DIAGNOSIS — H25813 Combined forms of age-related cataract, bilateral: Secondary | ICD-10-CM | POA: Diagnosis not present

## 2023-07-05 DIAGNOSIS — H524 Presbyopia: Secondary | ICD-10-CM | POA: Diagnosis not present

## 2023-07-24 DIAGNOSIS — M5442 Lumbago with sciatica, left side: Secondary | ICD-10-CM | POA: Diagnosis not present

## 2023-08-01 DIAGNOSIS — E782 Mixed hyperlipidemia: Secondary | ICD-10-CM | POA: Diagnosis not present

## 2023-08-01 DIAGNOSIS — K219 Gastro-esophageal reflux disease without esophagitis: Secondary | ICD-10-CM | POA: Diagnosis not present

## 2023-08-01 DIAGNOSIS — J849 Interstitial pulmonary disease, unspecified: Secondary | ICD-10-CM | POA: Diagnosis not present

## 2023-08-01 DIAGNOSIS — I272 Pulmonary hypertension, unspecified: Secondary | ICD-10-CM | POA: Diagnosis not present

## 2023-08-15 ENCOUNTER — Telehealth: Payer: Self-pay | Admitting: *Deleted

## 2023-08-15 ENCOUNTER — Ambulatory Visit: Payer: 59 | Admitting: Student

## 2023-08-15 VITALS — BP 139/80 | HR 84 | Temp 97.8°F | Ht 68.0 in | Wt 213.2 lb

## 2023-08-15 DIAGNOSIS — E7800 Pure hypercholesterolemia, unspecified: Secondary | ICD-10-CM

## 2023-08-15 DIAGNOSIS — R7989 Other specified abnormal findings of blood chemistry: Secondary | ICD-10-CM | POA: Diagnosis not present

## 2023-08-15 DIAGNOSIS — M961 Postlaminectomy syndrome, not elsewhere classified: Secondary | ICD-10-CM

## 2023-08-15 DIAGNOSIS — E785 Hyperlipidemia, unspecified: Secondary | ICD-10-CM

## 2023-08-15 DIAGNOSIS — I10 Essential (primary) hypertension: Secondary | ICD-10-CM

## 2023-08-15 DIAGNOSIS — I252 Old myocardial infarction: Secondary | ICD-10-CM | POA: Diagnosis not present

## 2023-08-15 DIAGNOSIS — R7303 Prediabetes: Secondary | ICD-10-CM

## 2023-08-15 DIAGNOSIS — Z1159 Encounter for screening for other viral diseases: Secondary | ICD-10-CM | POA: Diagnosis not present

## 2023-08-15 DIAGNOSIS — Z0000 Encounter for general adult medical examination without abnormal findings: Secondary | ICD-10-CM

## 2023-08-15 NOTE — Telephone Encounter (Signed)
 Mammogram appointment March 5.2025 @ 11:30 a to arrive 11:15 a. Patient with appointment in hand. Aware of the 75.00 no show charge. Aware to call to cancel or reschedule.

## 2023-08-15 NOTE — Patient Instructions (Addendum)
 Keep up the great work!  The Medicare Diabetes Prevention Program helps those at risk of developing diabetes learn lifestyle changes to help decrease this risk. Participants work with a Lifestyle Coach and a small group of other members to develop knowledge and skills to promote sustainable lifestyle changes. This program includes 16 weekly sessions focusing on approaches to lifestyle change and their effect on preventing diabetes. After this time, you will have at least six lessons that occur monthly for continued support.  This program is FREE under Medicare. Please let me know if you have interested in a referral to get started with preventing diabetes and improving your health!    Follow-up in 6 months.

## 2023-08-15 NOTE — Progress Notes (Unsigned)
 CC: Follow-up  HPI:  [name] is a 78 y.o. female living with a history stated below and presents today for follow-up. Please see problem based assessment and plan for additional details.  Past Medical History:  Diagnosis Date   Chronic back pain    "mostly lower; left foot is numb all the time" (11/22/2016)   Chronic kidney disease    ?renal cyst on MRI    GERD (gastroesophageal reflux disease)    High cholesterol    IPF (idiopathic pulmonary fibrosis) (HCC)    Lymphadenopathy    documented on chest CT scan 12/15/2005   Substance abuse (HCC)    tobacco    Current Outpatient Medications on File Prior to Visit  Medication Sig Dispense Refill   albuterol (VENTOLIN HFA) 108 (90 Base) MCG/ACT inhaler INHALE 1-2 PUFFS BY MOUTH EVERY 6 HOURS AS NEEDED FOR WHEEZE OR SHORTNESS OF BREATH 8.5 each 2   amLODipine (NORVASC) 10 MG tablet Take 1 tablet (10 mg total) by mouth daily. 90 tablet 6   aspirin 81 MG chewable tablet Chew 81 mg by mouth daily.     diclofenac Sodium (VOLTAREN) 1 % GEL Apply 4 g topically 4 (four) times daily. 4 g 3   fluticasone (FLONASE) 50 MCG/ACT nasal spray USE 1-2 SPRAYS IN EACH NOSTRIL DAILY 48 mL 3   gabapentin (NEURONTIN) 600 MG tablet Take 1 tablet (600 mg total) by mouth 4 (four) times daily. 120 tablet 3   GEMTESA 75 MG TABS Take 1 tablet by mouth daily.     MEDROL 4 MG TBPK tablet Take by mouth as directed.     metoprolol succinate (TOPROL-XL) 25 MG 24 hr tablet Take 25 mg by mouth daily.     pantoprazole (PROTONIX) 40 MG tablet TAKE 1 TABLET BY MOUTH EVERY DAY 90 tablet 2   rosuvastatin (CRESTOR) 20 MG tablet Take 1 tablet (20 mg total) by mouth daily. 90 tablet 3   Sodium Chloride-Sodium Bicarb (NETI POT SINUS WASH) 2300-700 MG KIT Place 1 kit into the nose daily. 1 kit 0   traMADol (ULTRAM) 50 MG tablet Take 50 mg by mouth 3 (three) times daily as needed.     No current facility-administered medications on file prior to visit.    Family  History  Problem Relation Age of Onset   Diabetes Father    Hypertension Father     Social History   Socioeconomic History   Marital status: Divorced    Spouse name: Not on file   Number of children: 0   Years of education: Not on file   Highest education level: Not on file  Occupational History    Employer: UNEMPLOYED   Occupation: Disabled/Retired  Tobacco Use   Smoking status: Former    Current packs/day: 0.00    Average packs/day: 0.5 packs/day for 47.0 years (23.5 ttl pk-yrs)    Types: Cigarettes    Start date: 05/13/1973    Quit date: 05/13/2020    Years since quitting: 3.2   Smokeless tobacco: Never   Tobacco comments:    stopped around Thanksgiving   Vaping Use   Vaping status: Never Used  Substance and Sexual Activity   Alcohol use: Yes    Alcohol/week: 0.0 standard drinks of alcohol    Comment: 11/22/2016 "2 drinks q other holiday"   Drug use: Not Currently    Types: Marijuana    Comment: 66/2018 "I stopped in the 1970s"   Sexual activity: Yes  Other Topics Concern  Not on file  Social History Narrative   Patient's new Phone # [phone] and [phone].   Lives alone.   Social Drivers of Corporate investment banker Strain: Low Risk  (05/02/2023)   Overall Financial Resource Strain (CARDIA)    Difficulty of Paying Living Expenses: Not hard at all  Food Insecurity: No Food Insecurity (05/02/2023)   Hunger Vital Sign    Worried About Running Out of Food in the Last Year: Never true    Ran Out of Food in the Last Year: Never true  Transportation Needs: No Transportation Needs (05/02/2023)   PRAPARE - Administrator, Civil Service (Medical): No    Lack of Transportation (Non-Medical): No  Physical Activity: Inactive (05/02/2023)   Exercise Vital Sign    Days of Exercise per Week: 0 days    Minutes of Exercise per Session: 0 min  Stress: No Stress Concern Present (05/02/2023)   Laura [name] of Occupational Health - Occupational Stress  Questionnaire    Feeling of Stress : Not at all  Social Connections: Moderately Isolated (05/02/2023)   Social Connection and Isolation Panel [NHANES]    Frequency of Communication with Friends and Family: More than three times a week    Frequency of Social Gatherings with Friends and Family: More than three times a week    Attends Religious Services: More than 4 times per year    Active Member of [employer] or Organizations: No    Attends Banker Meetings: Never    Marital Status: Divorced  Catering manager Violence: Patient Unable To Answer (05/02/2023)   Humiliation, Afraid, Rape, and Kick questionnaire    Fear of Current or Ex-Partner: Patient unable to answer    Emotionally Abused: Patient unable to answer    Physically Abused: Patient unable to answer    Sexually Abused: Patient unable to answer    Review of Systems: ROS negative except for what is noted on the assessment and plan.  Vitals:   08/15/23 1308  BP: 139/80  Pulse: 84  Temp: 97.8 F (36.6 C)  TempSrc: Oral  SpO2: 95%  Weight: 213 lb 3.2 oz (96.7 kg)  Height: 5\' 8"  (1.727 m)    Physical Exam: Constitutional: well-appearing, sitting in chair, in no acute distress Cardiovascular: regular rate and rhythm, no m/r/g Pulmonary/Chest: normal work of breathing on room air, lungs clear to auscultation bilaterally Abdominal: soft, non-tender, non-distended MSK: normal bulk and tone Skin: warm and dry Psych: normal mood and behavior  Assessment & Plan:     Patient discussed with Dr. {[id number]::"[id number]","V. Hoffman","Mullen","Narendra","Vincent","Guilloud","Lau","Machen"}  No problem-specific Assessment & Plan notes found for this encounter.   Carmina Miller, D.O. [facility] Internal [facility], PGY-1 Phone: [phone] Date 08/15/2023 Time 1:16 PM  Tobacco use Reports cessation for the last 2-3 weeks. She did not use anything to help with cessation.    Pre-diabetes      Lab Results   Component Value Date    HGBA1C 5.7 (A) 05/04/2023    A1c improved from prior. We reviewed lifestyle modifications. She continues to recover form fall earlier this year and is limited with mobility.    IPF (idiopathic pulmonary fibrosis) (HCC) Repeat CT showed indeterminate findings for UIP. She is waiting on call from pulmonology about results. She is not interested in antifibrotic therapy. Most importantly she has successfully stopped smoking for the last few weeks which is a goal we talked about in July. -encourage continued smoking cessation.   HTN (hypertension) BP remains  well controlled on amlodipine 10 mg P Repeat annual BMP   HLD (hyperlipidemia) For secondary prevention with history of MI. Adherent with crestor 10 mg.  P: Lipid panel   Addendum 11/22: LDL at 82, I called and talked with patient. She was open to increasing crestor back to 20 mg. She does not recall having side effects previously.     History of acute myocardial infarction Stress test in 2019 showed infarct to apex. Follows with Dr. Sharyn Lull who is requesting BMP, lipid panel and hepatic function panel. -labs completed in office

## 2023-08-16 ENCOUNTER — Encounter: Payer: Self-pay | Admitting: Student

## 2023-08-16 LAB — HCV INTERPRETATION

## 2023-08-16 LAB — HEMOGLOBIN A1C
Est. average glucose Bld gHb Est-mCnc: 123 mg/dL
Hgb A1c MFr Bld: 5.9 % — ABNORMAL HIGH (ref 4.8–5.6)

## 2023-08-16 LAB — HCV AB W REFLEX TO QUANT PCR: HCV Ab: NONREACTIVE

## 2023-08-16 NOTE — Assessment & Plan Note (Signed)
 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 Follows with Dr. Sharyn Lull. Denies chest pain/SOB. -Continue ASA and Crestor

## 2023-08-16 NOTE — Assessment & Plan Note (Addendum)
 1C 04/2023 was 5.7.  She has continued to focus on diet/lifestyle modifications.  Denies polydipsia, polyuria, neuropathy. Patient given information about Medicare Diabetes Prevention Program. -A1c today

## 2023-08-16 NOTE — Assessment & Plan Note (Addendum)
 BP is 139/80. Had recent Cardiology appointment in which amlodipine was discontinued and metoprolol was increased from 25 to 50 mg daily.  Patient states this was because her heart was "fluttering".  Unfortunately do not have outside records for this appointment.  Today heart is RRR.  Asymptomatic.

## 2023-08-16 NOTE — Assessment & Plan Note (Signed)
 Referral for colonoscopy.  Hep C screening.  And mammography screening.

## 2023-08-16 NOTE — Assessment & Plan Note (Signed)
 LDL 04/2023 was 82 above goal for secondary prevention given MI.  At that time Crestor was increased from 10 to 20 mg.  Patient has been tolerating increase statin dose.  We can repeat lipid panel at follow-up.

## 2023-08-20 NOTE — Addendum Note (Signed)
 Addended by: Dickie La on: 08/20/2023 01:42 PM   Modules accepted: Level of Service

## 2023-08-20 NOTE — Progress Notes (Signed)
 Internal Medicine Clinic Attending  Case discussed with the resident at the time of the visit.  We reviewed the resident's history and exam and pertinent patient test results.  I agree with the assessment, diagnosis, and plan of care documented in the resident's note.

## 2023-08-22 ENCOUNTER — Ambulatory Visit: Payer: 59

## 2023-08-25 DIAGNOSIS — M545 Low back pain, unspecified: Secondary | ICD-10-CM | POA: Diagnosis not present

## 2023-08-25 DIAGNOSIS — M4727 Other spondylosis with radiculopathy, lumbosacral region: Secondary | ICD-10-CM | POA: Diagnosis not present

## 2023-08-25 DIAGNOSIS — Z79899 Other long term (current) drug therapy: Secondary | ICD-10-CM | POA: Diagnosis not present

## 2023-08-28 ENCOUNTER — Ambulatory Visit
Admission: RE | Admit: 2023-08-28 | Discharge: 2023-08-28 | Disposition: A | Discharge status: Home / Self Care | Source: Ambulatory Visit | Attending: Internal Medicine | Admitting: Internal Medicine

## 2023-08-28 DIAGNOSIS — Z1231 Encounter for screening mammogram for malignant neoplasm of breast: Secondary | ICD-10-CM | POA: Diagnosis not present

## 2023-08-28 DIAGNOSIS — Z0000 Encounter for general adult medical examination without abnormal findings: Secondary | ICD-10-CM

## 2023-09-03 DIAGNOSIS — Z860101 Personal history of adenomatous and serrated colon polyps: Secondary | ICD-10-CM | POA: Diagnosis not present

## 2023-09-26 DIAGNOSIS — K5730 Diverticulosis of large intestine without perforation or abscess without bleeding: Secondary | ICD-10-CM | POA: Diagnosis not present

## 2023-09-26 DIAGNOSIS — Z860101 Personal history of adenomatous and serrated colon polyps: Secondary | ICD-10-CM | POA: Diagnosis not present

## 2023-09-26 DIAGNOSIS — Z09 Encounter for follow-up examination after completed treatment for conditions other than malignant neoplasm: Secondary | ICD-10-CM | POA: Diagnosis not present

## 2023-09-26 LAB — HM COLONOSCOPY

## 2023-10-03 ENCOUNTER — Other Ambulatory Visit: Payer: Self-pay | Admitting: Internal Medicine

## 2023-10-03 DIAGNOSIS — R0981 Nasal congestion: Secondary | ICD-10-CM

## 2023-10-03 NOTE — Telephone Encounter (Signed)
 Spoke with pt Pt would like refill on albuterol NOT Flonase States she has this rx'd last year  CMA asked reason for need-pt states that she has been "a little more short of breath than usual" Denies chest pain Pt informed that she needs to be seen and given appt for tomorrow at 1545 Pt will obtain more urgent care if symptoms worsen or new symptoms develop.

## 2023-10-03 NOTE — Telephone Encounter (Signed)
 Copied from CRM [id number]. Topic: Clinical - Prescription Issue >> Oct 03, 2023  3:42 PM Brynn Caras wrote: Reason for CRM: The patient states her pharmacy [facility] will be faxing over a request of an inhaler she previously was prescribed by Dr. Jannice Mends last year. I advised the turn-around for an Rx refill request of 3 business days for a provider to sign-off. The patient is completely out of this and needs this sent over to the pharmacy by tomorrow, if possible? Callback #:

## 2023-10-03 NOTE — Telephone Encounter (Signed)
 Copied from CRM [id number]. Topic: Clinical - Medication Refill >> Oct 03, 2023  3:47 PM Brynn Caras wrote: Most Recent Primary Care Visit:  Provider: Ronni Colace  Department: [facility]  Visit Type: OPEN ESTABLISHED  Date: 08/15/2023  Medication: fluticasone (FLONASE) 50 MCG/ACT nasal spray   Has the patient contacted their pharmacy? Yes (Agent: If no, request that the patient contact the pharmacy for the refill. If patient does not wish to contact the pharmacy document the reason why and proceed with request.) (Agent: If yes, when and what did the pharmacy advise?)  Is this the correct pharmacy for this prescription? Yes If no, delete pharmacy and type the correct one.  This is the patient's preferred pharmacy:  [id number] [place], [place] - [address] AT [facility] [address] [place] [address] Phone: [phone] Fax: [phone]  [facility] - [place], [place] - [address] [place] [address] Phone: [phone] Fax: [phone]   Has the prescription been filled recently? No  Is the patient out of the medication? Yes  Has the patient been seen for an appointment in the last year OR does the patient have an upcoming appointment? No  Can we respond through MyChart? No  Agent: Please be advised that Rx refills may take up to 3 business days. We ask that you follow-up with your pharmacy.

## 2023-10-04 ENCOUNTER — Ambulatory Visit: Admitting: Student

## 2023-10-04 VITALS — BP 117/69 | HR 93 | Temp 97.6°F | Ht 68.0 in | Wt 214.3 lb

## 2023-10-04 DIAGNOSIS — E785 Hyperlipidemia, unspecified: Secondary | ICD-10-CM | POA: Diagnosis not present

## 2023-10-04 DIAGNOSIS — E7800 Pure hypercholesterolemia, unspecified: Secondary | ICD-10-CM

## 2023-10-04 DIAGNOSIS — J849 Interstitial pulmonary disease, unspecified: Secondary | ICD-10-CM

## 2023-10-04 DIAGNOSIS — J84112 Idiopathic pulmonary fibrosis: Secondary | ICD-10-CM | POA: Diagnosis not present

## 2023-10-04 DIAGNOSIS — Z0000 Encounter for general adult medical examination without abnormal findings: Secondary | ICD-10-CM

## 2023-10-04 MED ORDER — ALBUTEROL SULFATE HFA 108 (90 BASE) MCG/ACT IN AERS: 2.0000 | INHALATION_SPRAY | Freq: Four times a day (QID) | RESPIRATORY_TRACT | 2 refills | Status: DC | PRN

## 2023-10-04 NOTE — Assessment & Plan Note (Signed)
 This is a 78 year old female who presents today for refills needed for albuterol.  Denies any worsening of shortness of breath or cough from baseline.  Denies any limitation to her activities.  Reports that she does not use oxygen at home.  Denies any dizziness or lightheadedness.  States that she is only here for refills on her albuterol.  Denies any recent congestion sore throat or fevers.  Per physical exam, she has a Velcro-like crackles notable bilateral lower lung fields consistent with her history of idiopathic pulmonary fibrosis.  She does follow with Dr. Waylan Haggard, her pulmonologist, who had noted her progression and had offered antifibrotic therapy for which she was against it.  - Albuterol is refilled

## 2023-10-04 NOTE — Progress Notes (Signed)
 Acute Office Visit  Subjective:     Patient ID: Laura [name], female    DOB: 03-01-1946, 78 y.o.   MRN: [id number]  Chief Complaint  Patient presents with   Follow-up    Routine office visit for medication    HPI This is a 78 year old female with past medical history of hypertension, hyperlipidemia, prediabetes who presents today for shortness of breath and wants refill on albuterol.  ROS   As per assessment and plan  Objective:    BP 117/69 (BP Location: Left Arm, Patient Position: Sitting, Cuff Size: Normal)   Pulse 93   Temp 97.6 F (36.4 C) (Oral)   Ht 5\' 8"  (1.727 m)   Wt 214 lb 4.8 oz (97.2 kg)   SpO2 95%   BMI 32.58 kg/m  BP Readings from Last 3 Encounters:  10/04/23 117/69  08/15/23 139/80  05/04/23 125/88   Wt Readings from Last 3 Encounters:  10/04/23 214 lb 4.8 oz (97.2 kg)  08/15/23 213 lb 3.2 oz (96.7 kg)  05/04/23 211 lb 1.6 oz (95.8 kg)   SpO2 Readings from Last 3 Encounters:  10/04/23 95%  08/15/23 95%  05/04/23 98%      Physical Exam  General: Sitting in chair, no acute distress Cardiovascular: Regular rate, no murmurs appreciated Pulmonary: Breathing comfortably, +Velcro like crackles evident bilateral lower lung fields (consistent with IPF) Abdomen: Soft, nontender, nondistended, bowel sounds present MSK: Range of motion intact, no lower extremity edema  Assessment & Plan:    Problem List Items Addressed This Visit       Respiratory   IPF (idiopathic pulmonary fibrosis) (HCC) - Primary   This is a 78 year old female who presents today for refills needed for albuterol.  Denies any worsening of shortness of breath or cough from baseline.  Denies any limitation to her activities.  Reports that she does not use oxygen at home.  Denies any dizziness or lightheadedness.  States that she is only here for refills on her albuterol.  Denies any recent congestion sore throat or fevers.  Per physical exam, she has a Velcro-like crackles  notable bilateral lower lung fields consistent with her history of idiopathic pulmonary fibrosis.  She does follow with Dr. Isaiah Serge, her pulmonologist, who had noted her progression and had offered antifibrotic therapy for which she was against it.  - Albuterol is refilled      ILD (interstitial lung disease) (HCC)     Other   HLD (hyperlipidemia)   Lab Results  Component Value Date   CHOL 131 05/04/2023   HDL 33 (L) 05/04/2023   LDLCALC 85 05/04/2023   TRIG 59 05/04/2023   CHOLHDL 4.0 05/04/2023   Last LDL 85, goal < 70, presently taking Crestor 20 mg daily. I did offer rechecking her lipid panel today, she reports that she will get it done at her next office visit.       Healthcare maintenance   Patient reports that she had her last colonoscopy with Dr. Dulce Sellar last week who told her that she does not need to come back anymore.       Meds ordered this encounter  Medications   albuterol (VENTOLIN HFA) 108 (90 Base) MCG/ACT inhaler    Sig: Inhale 2 puffs into the lungs every 6 (six) hours as needed for wheezing or shortness of breath.    Dispense:  8.5 each    Refill:  2    Return in about 4 months (around 02/03/2024) for HLD .  Lanney Pitts, DO

## 2023-10-04 NOTE — Assessment & Plan Note (Signed)
 Patient reports that she had her last colonoscopy with Dr. Kimble Pennant last week who told her that she does not need to come back anymore.

## 2023-10-04 NOTE — Assessment & Plan Note (Signed)
 Lab Results  Component Value Date   CHOL 131 05/04/2023   HDL 33 (L) 05/04/2023   LDLCALC 85 05/04/2023   TRIG 59 05/04/2023   CHOLHDL 4.0 05/04/2023   Last LDL 85, goal < 70, presently taking Crestor 20 mg daily. I did offer rechecking her lipid panel today, she reports that she will get it done at her next office visit.

## 2023-10-04 NOTE — Patient Instructions (Signed)
 Thank you, [name] for allowing us  to provide your care today. Today we discussed:  Albuterol is sent to the pharmacy.     I have ordered the following labs for you:  Lab Orders  No laboratory test(s) ordered today     Tests ordered today:  None   Referrals ordered today:   Referral Orders  No referral(s) requested today     I have ordered the following medication/changed the following medications:   Stop the following medications: Medications Discontinued During This Encounter  Medication Reason   albuterol (VENTOLIN HFA) 108 (90 Base) MCG/ACT inhaler Reorder     Start the following medications: Meds ordered this encounter  Medications   albuterol (VENTOLIN HFA) 108 (90 Base) MCG/ACT inhaler    Sig: Inhale 2 puffs into the lungs every 6 (six) hours as needed for wheezing or shortness of breath.    Dispense:  8.5 each    Refill:  2     Follow up: 3-4 months    Remember: None  Should you have any questions or concerns please call the internal medicine clinic at [phone].     Lanney Pitts, DO [facility]

## 2023-10-05 NOTE — Progress Notes (Signed)
 Internal Medicine Clinic Attending  Case discussed with the resident at the time of the visit.  We reviewed the resident's history and exam and pertinent patient test results.  I agree with the assessment, diagnosis, and plan of care documented in the resident's note.

## 2023-10-31 DIAGNOSIS — I10 Essential (primary) hypertension: Secondary | ICD-10-CM | POA: Diagnosis not present

## 2023-10-31 DIAGNOSIS — J849 Interstitial pulmonary disease, unspecified: Secondary | ICD-10-CM | POA: Diagnosis not present

## 2023-10-31 DIAGNOSIS — K219 Gastro-esophageal reflux disease without esophagitis: Secondary | ICD-10-CM | POA: Diagnosis not present

## 2023-10-31 DIAGNOSIS — E782 Mixed hyperlipidemia: Secondary | ICD-10-CM | POA: Diagnosis not present

## 2023-11-15 ENCOUNTER — Ambulatory Visit (INDEPENDENT_AMBULATORY_CARE_PROVIDER_SITE_OTHER): Admitting: Student

## 2023-11-15 VITALS — BP 117/77 | HR 98 | Temp 98.0°F | Ht 68.0 in

## 2023-11-15 DIAGNOSIS — J849 Interstitial pulmonary disease, unspecified: Secondary | ICD-10-CM

## 2023-11-15 DIAGNOSIS — M961 Postlaminectomy syndrome, not elsewhere classified: Secondary | ICD-10-CM

## 2023-11-15 DIAGNOSIS — I2721 Secondary pulmonary arterial hypertension: Secondary | ICD-10-CM

## 2023-11-15 DIAGNOSIS — I272 Pulmonary hypertension, unspecified: Secondary | ICD-10-CM | POA: Diagnosis not present

## 2023-11-15 DIAGNOSIS — R0602 Shortness of breath: Secondary | ICD-10-CM | POA: Diagnosis not present

## 2023-11-15 DIAGNOSIS — J84112 Idiopathic pulmonary fibrosis: Secondary | ICD-10-CM

## 2023-11-15 DIAGNOSIS — Z130 Encounter for screening for diseases of the blood and blood-forming organs and certain disorders involving the immune mechanism: Secondary | ICD-10-CM | POA: Diagnosis not present

## 2023-11-15 MED ORDER — GABAPENTIN 600 MG PO TABS
600.0000 mg | ORAL_TABLET | Freq: Four times a day (QID) | ORAL | 3 refills | Status: DC
Start: 2023-11-15 — End: 2024-03-19

## 2023-11-15 MED ORDER — TYVASO DPI TITRATION KIT 16 & 32 & 48 MCG IN POWD: 16.0000 ug | Freq: Four times a day (QID) | RESPIRATORY_TRACT | 0 refills | Status: DC

## 2023-11-15 MED ORDER — TYVASO 0.6 MG/ML IN SOLN
18.0000 ug | RESPIRATORY_TRACT | 1 refills | Status: DC
Start: 2023-11-15 — End: 2023-11-15

## 2023-11-15 NOTE — Assessment & Plan Note (Addendum)
 Patient is a 78 year old with a past medical history of interstitial lung disease.  She recently followed with Dr. Waylan Haggard in September 2024 and was advised to start antifibrotic therapy.  At that time she refused because of fear of the secondary side effects.  She was to follow-up with a repeat high-resolution CT scan in 7 months which she has not yet done this.  She does have a follow-up with Dr. Waylan Haggard in August.  She presents today with increased shortness of breath and decreased oxygen saturation around 88%. She saw us  last month and was doing fine. A prescription of albuterol  was sent.  Her worsening shortness of breath started about a month ago.  She did see cardiology in February for atrial flutter and was told to stop albuterol  and her metoprolol dose was increased.  She denies any orthopnea but at that time she was having bilateral lower extremity edema.  She does have fine crackles in all lung fields on auscultation.  She did follow with us  in April and at the time she only had Velcro-like crackles at the lung bases.  She does have a significant history of smoking.  She did quit smoking 2 months ago but is still exposed significantly to secondhand smoke.  The room does smell like cigarette smoke today and she is not accompanied by anyone.  She did obtain PFTs in December of 2023 which showed decreased DLCO decreased FVC/FEV1 ratio but no signs of obstruction.  No significant improvement after bronchodilators. I do not think she would benefit from a bronchodilator at this time. A CT chest was done and it was concerning for emphysematous changes but also pulmonary hypertension.  Dr. Waylan Haggard was concerned about worsening fibrosis and hence he did suggest antifibrotics.  She is afebrile today and although she reports a cough it may be due to worsening UIP.  Ambulation trial with oxygen was done and she is able to keep her oxygenation over 90% with 2 L of oxygen.  She has not gotten a echo done since last year  and given her recent CT showing signs of pulmonary hypertension I do think would be beneficial for her to get an echo.  She does not have a JVD or hepatomegaly today although she reports that she has difficulty sleeping on her right side and can only sleep on her left.  I wonder if this increases preload on her right side to counteract the pulmonary hypertension she may have.  Given her pulmonary hypertension we will start TyVaso and she should follow with Dr. Waylan Haggard sooner if able once she gets the high resolution CT and consider starting antifibrotic therapy.   -CBC with Diff given SOB  -Iron panel  -Echo  -High resolution CT  -Tyvaso  -Start O2 at home  -Follow with Dr. Waylan Haggard

## 2023-11-15 NOTE — Progress Notes (Signed)
 SATURATION QUALIFICATIONS: (This note is used to comply with regulatory documentation for home oxygen)  Patient Saturations on Room Air at Rest = 90%  Patient Saturations on Room Air while Ambulating = 87%  Patient Saturations on 1 Liter of oxygen while Ambulating = 88%  Patient Saturations on 2 Liters of oxygen while Ambulating = 90%  Please briefly explain why patient needs home oxygen: see MD notes explanation

## 2023-11-15 NOTE — Progress Notes (Signed)
 CC:  Chief Complaint  Patient presents with   Shortness of Breath    Increased shortness of breath O2 Sat 87-88% on RA at rest    HPI:  [name] is a 78 y.o. female living with a history stated below and presents today for the above. Please see problem based assessment and plan for additional details.  Past Medical History:  Diagnosis Date   Chronic back pain    "mostly lower; left foot is numb all the time" (11/22/2016)   Chronic kidney disease    ?renal cyst on MRI    GERD (gastroesophageal reflux disease)    Health care maintenance 12/31/2012   High cholesterol    IPF (idiopathic pulmonary fibrosis) (HCC)    Lymphadenopathy    documented on chest CT scan 12/15/2005   Substance abuse (HCC)    tobacco    Current Outpatient Medications on File Prior to Visit  Medication Sig Dispense Refill   albuterol  (VENTOLIN  HFA) 108 (90 Base) MCG/ACT inhaler Inhale 2 puffs into the lungs every 6 (six) hours as needed for wheezing or shortness of breath. 8.5 each 2   aspirin  81 MG chewable tablet Chew 81 mg by mouth daily.     diclofenac  Sodium (VOLTAREN ) 1 % GEL Apply 4 g topically 4 (four) times daily. 4 g 3   fluticasone  (FLONASE ) 50 MCG/ACT nasal spray USE 1-2 SPRAYS IN EACH NOSTRIL DAILY 48 mL 3   GEMTESA  75 MG TABS Take 1 tablet by mouth daily.     MEDROL 4 MG TBPK tablet Take by mouth as directed.     metoprolol succinate (TOPROL-XL) 25 MG 24 hr tablet Take 50 mg by mouth daily.     pantoprazole  (PROTONIX ) 40 MG tablet TAKE 1 TABLET BY MOUTH EVERY DAY 90 tablet 2   rosuvastatin  (CRESTOR ) 20 MG tablet Take 1 tablet (20 mg total) by mouth daily. 90 tablet 3   Sodium Chloride-Sodium Bicarb (NETI POT SINUS WASH) 2300-700 MG KIT Place 1 kit into the nose daily. 1 kit 0   traMADol (ULTRAM) 50 MG tablet Take 50 mg by mouth 3 (three) times daily as needed.     No current facility-administered medications on file prior to visit.    Family History  Problem Relation Age of  Onset   Diabetes Father    Hypertension Father     Social History   Socioeconomic History   Marital status: Divorced    Spouse name: Not on file   Number of children: 0   Years of education: Not on file   Highest education level: Not on file  Occupational History    Employer: UNEMPLOYED   Occupation: Disabled/Retired  Tobacco Use   Smoking status: Former    Current packs/day: 0.00    Average packs/day: 0.5 packs/day for 47.0 years (23.5 ttl pk-yrs)    Types: Cigarettes    Start date: 05/13/1973    Quit date: 05/13/2020    Years since quitting: 3.5   Smokeless tobacco: Never   Tobacco comments:    stopped around Thanksgiving   Vaping Use   Vaping status: Never Used  Substance and Sexual Activity   Alcohol use: Yes    Alcohol/week: 0.0 standard drinks of alcohol    Comment: 11/22/2016 "2 drinks q other holiday"   Drug use: Not Currently    Types: Marijuana    Comment: 66/2018 "I stopped in the 1970s"   Sexual activity: Yes  Other Topics Concern   Not on file  Social History Narrative   Patient's new Phone # [phone] and [phone].   Lives alone.   Social Drivers of Corporate investment banker Strain: Low Risk  (05/02/2023)   Overall Financial Resource Strain (CARDIA)    Difficulty of Paying Living Expenses: Not hard at all  Food Insecurity: No Food Insecurity (05/02/2023)   Hunger Vital Sign    Worried About Running Out of Food in the Last Year: Never true    Ran Out of Food in the Last Year: Never true  Transportation Needs: No Transportation Needs (05/02/2023)   PRAPARE - Administrator, Civil Service (Medical): No    Lack of Transportation (Non-Medical): No  Physical Activity: Inactive (05/02/2023)   Exercise Vital Sign    Days of Exercise per Week: 0 days    Minutes of Exercise per Session: 0 min  Stress: No Stress Concern Present (05/02/2023)   [employer] of Occupational Health - Occupational Stress Questionnaire    Feeling of Stress :  Not at all  Social Connections: Moderately Isolated (05/02/2023)   Social Connection and Isolation Panel [NHANES]    Frequency of Communication with Friends and Family: More than three times a week    Frequency of Social Gatherings with Friends and Family: More than three times a week    Attends Religious Services: More than 4 times per year    Active Member of [employer] or Organizations: No    Attends Banker Meetings: Never    Marital Status: Divorced  Catering manager Violence: Patient Unable To Answer (05/02/2023)   Humiliation, Afraid, Rape, and Kick questionnaire    Fear of Current or Ex-Partner: Patient unable to answer    Emotionally Abused: Patient unable to answer    Physically Abused: Patient unable to answer    Sexually Abused: Patient unable to answer    Review of Systems: ROS negative except for what is noted on the assessment and plan.  Vitals:   11/15/23 0915  BP: 117/77  Pulse: 98  Temp: 98 F (36.7 C)  TempSrc: Oral  SpO2: (!) 87%  Height: 5\' 8"  (1.727 m)    Physical Exam: Constitutional: chronically ill appearing, in NAD, able to speak full sentences HENT: normocephalic atraumatic, mucous membranes moist Eyes: conjunctiva non-erythematous, non-jaundiced Cardiovascular: regular rate and rhythm, no m/r/g, no BLE, no JVD Pulmonary/Chest:alight accessory muscle use with fine crackles over all lung fields, ronchi present Abdominal: soft, non-tender, non-distended, no hepatosplenomegaly MSK: normal bulk and tone Neurological: alert & oriented x 3, no focal deficit Skin: warm and dry Psych: normal mood and behavior  Assessment & Plan:   Patient discussed with Dr. Lelia Putnam  IPF (idiopathic pulmonary fibrosis) (HCC) Patient is a 78 year old with a past medical history of interstitial lung disease.  She recently followed with Dr. Waylan Haggard in September 2024 and was advised to start antifibrotic therapy.  At that time she refused because of fear of the  secondary side effects.  She was to follow-up with a repeat high-resolution CT scan in 7 months which she has not yet done this.  She does have a follow-up with Dr. Waylan Haggard in August.  She presents today with increased shortness of breath and decreased oxygen saturation around 88%. She saw us  last month and was doing fine. A prescription of albuterol  was sent.  Her worsening shortness of breath started about a month ago.  She did see cardiology in February for atrial flutter and was told to stop albuterol  and her metoprolol dose  was increased.  She denies any orthopnea but at that time she was having bilateral lower extremity edema.  She does have fine crackles in all lung fields on auscultation.  She did follow with us  in April and at the time she only had Velcro-like crackles at the lung bases.  She does have a significant history of smoking.  She did quit smoking 2 months ago but is still exposed significantly to secondhand smoke.  The room does smell like cigarette smoke today and she is not accompanied by anyone.  She did obtain PFTs in December of 2023 which showed decreased DLCO decreased FVC/FEV1 ratio but no signs of obstruction.  No significant improvement after bronchodilators. I do not think she would benefit from a bronchodilator at this time. A CT chest was done and it was concerning for emphysematous changes but also pulmonary hypertension.  Dr. Waylan Haggard was concerned about worsening fibrosis and hence he did suggest antifibrotics.  She is afebrile today and although she reports a cough it may be due to worsening UIP.  Ambulation trial with oxygen was done and she is able to keep her oxygenation over 90% with 2 L of oxygen.  She has not gotten a echo done since last year and given her recent CT showing signs of pulmonary hypertension I do think would be beneficial for her to get an echo.  She does not have a JVD or hepatomegaly today although she reports that she has difficulty sleeping on her right side  and can only sleep on her left.  I wonder if this increases preload on her right side to counteract the pulmonary hypertension she may have.  Given her pulmonary hypertension we will start TyVaso and she should follow with Dr. Waylan Haggard sooner if able once she gets the high resolution CT and consider starting antifibrotic therapy.   -CBC with Diff given SOB  -Iron panel  -Echo  -High resolution CT  -Tyvaso  -Start O2 at home  -Follow with Dr. Waylan Haggard   Jose Ngo, MD [facility] Internal Medicine, PGY-1 Phone: [phone] Date 11/15/2023 Time 11:47 AM

## 2023-11-15 NOTE — Patient Instructions (Addendum)
 Thank you, Ms.Malinda MACENZIE BURFORD for allowing us  to provide your care today.   I have ordered the following labs for you:   Lab Orders         CBC with Diff         Iron, TIBC and Ferritin Panel      Referrals ordered today:    Referral Orders         Ambulatory Referral for DME    - Oxygen  I have ordered the following medication/changed the following medications:   Start the following medications: Meds ordered this encounter  Medications   gabapentin  (NEURONTIN ) 600 MG tablet    Sig: Take 1 tablet (600 mg total) by mouth 4 (four) times daily.    Dispense:  120 tablet    Refill:  3   Treprostinil (TYVASO) 0.6 MG/ML SOLN    Sig: Inhale 18 mcg into the lungs as directed. Four times daily every 4 hours while awake.    Dispense:  8.7 mL    Refill:  1    Please review with patient how to use this medication    -Get High resolution CT scan  -Call Dr Waylan Haggard for an earlier appointment  -Use oxygen at home- do not smoke with it or be near a flame.  -Start Tyvaso- call us  if unable to tolerate the three inhalations four times a day  -Echo  Follow up: 1 month or sooner if needed   Should you have any questions or concerns please call the internal medicine clinic at [phone]     Jose Ngo, MD [facility]

## 2023-11-15 NOTE — Addendum Note (Signed)
 Addended by: Jose Ngo on: 11/15/2023 03:16 PM   Modules accepted: Orders

## 2023-11-16 ENCOUNTER — Telehealth: Payer: Self-pay | Admitting: Internal Medicine

## 2023-11-16 ENCOUNTER — Ambulatory Visit: Payer: Self-pay | Admitting: Student

## 2023-11-16 LAB — CBC WITH DIFFERENTIAL/PLATELET
Basophils Absolute: 0.1 10*3/uL (ref 0.0–0.2)
Basos: 1 %
EOS (ABSOLUTE): 0.4 10*3/uL (ref 0.0–0.4)
Eos: 4 %
Hematocrit: 41.5 % (ref 34.0–46.6)
Hemoglobin: 13 g/dL (ref 11.1–15.9)
Immature Grans (Abs): 0.1 10*3/uL (ref 0.0–0.1)
Immature Granulocytes: 1 %
Lymphocytes Absolute: 2.1 10*3/uL (ref 0.7–3.1)
Lymphs: 23 %
MCH: 31.3 pg (ref 26.6–33.0)
MCHC: 31.3 g/dL — ABNORMAL LOW (ref 31.5–35.7)
MCV: 100 fL — ABNORMAL HIGH (ref 79–97)
Monocytes Absolute: 0.8 10*3/uL (ref 0.1–0.9)
Monocytes: 9 %
Neutrophils Absolute: 5.9 10*3/uL (ref 1.4–7.0)
Neutrophils: 62 %
Platelets: 274 10*3/uL (ref 150–450)
RBC: 4.15 x10E6/uL (ref 3.77–5.28)
RDW: 13 % (ref 11.7–15.4)
WBC: 9.4 10*3/uL (ref 3.4–10.8)

## 2023-11-16 LAB — IRON,TIBC AND FERRITIN PANEL
Ferritin: 313 ng/mL — ABNORMAL HIGH (ref 15–150)
Iron Saturation: 34 % (ref 15–55)
Iron: 75 ug/dL (ref 27–139)
Total Iron Binding Capacity: 220 ug/dL — ABNORMAL LOW (ref 250–450)
UIBC: 145 ug/dL (ref 118–369)

## 2023-11-16 NOTE — Telephone Encounter (Signed)
 Copied from CRM [id number]. Topic: Clinical - Medication Question >> Nov 16, 2023  9:10 AM Julie Oddi wrote: Reason for CRM: Tarri Farm called in from [facility] called for clarification regarding Treprostinil (TYVASO DPI TITRATION KIT) 16 & 32 & 48 MCG POWD. Please contact to provide clarification on how patient should take this medication. She can be reached at [phone]

## 2023-11-16 NOTE — Addendum Note (Signed)
 Addended by: Kirt Pereyra B on: 11/16/2023 08:28 AM   Modules accepted: Level of Service

## 2023-11-16 NOTE — Progress Notes (Signed)
 Internal Medicine Clinic Attending  I was physically present during the key portions of the resident provided service and participated in the medical decision making of patient's management care. I reviewed pertinent patient test results.  The assessment, diagnosis, and plan were formulated together and I agree with the documentation in the resident's note.  Cherylene Corrente, MD

## 2023-11-16 NOTE — Progress Notes (Signed)
 No IDA. Macrocytosis. May benefit from B12 and folate testing at next OV

## 2023-11-19 ENCOUNTER — Telehealth: Payer: Self-pay | Admitting: *Deleted

## 2023-11-19 NOTE — Telephone Encounter (Signed)
 Copied from CRM [id number]. Topic: General - Other >> Nov 19, 2023  1:45 PM Retta Caster wrote: Reason for CRM: Patient had visit on 05/29 and is requesting to be retested of oxygen before she is put on oxygen. Needs call back [phone]

## 2023-11-20 ENCOUNTER — Telehealth: Payer: Self-pay

## 2023-11-20 NOTE — Telephone Encounter (Signed)
 Received a fax from the pharmacy regarding a rx for Tyvaso , per pharmacy please clarify the directions are unclear. Please provide complete prescription directions: - Dose of medication:How may breathes should patient be taking qid? -Has this patient been on this dose before as a maintenance dose or is this a new therapy for the patient? If so will the patient need titration instructions?   The form has been placed in the yellow team box to be completed.

## 2023-11-27 ENCOUNTER — Telehealth: Payer: Self-pay | Admitting: *Deleted

## 2023-11-27 NOTE — Telephone Encounter (Signed)
 Spoke with patient regarding her appointment at [facility] for CT / appointment 12-10-2023 @ 8:00 a m to arrive 7:45 a m. Patient is aware of the appointment/ appointment mailed to the patient.

## 2023-11-28 ENCOUNTER — Other Ambulatory Visit: Payer: Self-pay | Admitting: Internal Medicine

## 2023-11-28 DIAGNOSIS — R0981 Nasal congestion: Secondary | ICD-10-CM

## 2023-11-28 NOTE — Telephone Encounter (Signed)
 Medication sent to pharmacy

## 2023-11-30 ENCOUNTER — Telehealth: Payer: Self-pay

## 2023-11-30 NOTE — Telephone Encounter (Signed)
 Prior Authorization for patient (Tyvaso  DPI Titration Kit 16 & 32 & powder) came through on cover my meds was submitted with last office notes awaiting approval or denial.  JWJ:[id number]

## 2023-11-30 NOTE — Telephone Encounter (Signed)
 [place] (Key: [id number]) PA Case ID #: [id number] Need Help? Call us  at [phone] Outcome Approved today by [employer] 2017 NCPDP Request Reference Number: [id number]. TYVASO  DPI POW 16-32-48 is approved through 06/18/2024. Your patient may now fill this prescription and it will be covered. Effective Date: 11/30/2023 Authorization Expiration Date: 06/18/2024 Drug Tyvaso  DPI Titration Kit 16 & 32 & powder ePA cloud logo Form [employer] Medicare Part D Electronic Prior Authorization Form [id number] NCPDP)

## 2023-12-03 ENCOUNTER — Telehealth: Payer: Self-pay | Admitting: *Deleted

## 2023-12-03 ENCOUNTER — Telehealth: Payer: Self-pay | Admitting: Internal Medicine

## 2023-12-03 NOTE — Telephone Encounter (Signed)
 I called and talked with patient.  She is frustrated as she had not been able to get inhaler (Tyvaso ) that was ordered at 5/30 office visit. [facility] gets her medications from [facility] which is [facility] the inhaler was sent. However she was told that medication would come [facility] was needed for this inhaler and will come through the mail which patient is aware of. She is concerned about how to use inhaler.  She also has not received oxygen that was ordered at office visit 5/30. She wants to be retested for hypoxia and has follow-up in clinic on 6/30.  P: Message sent to triage RN to help clarify why O2 hasn't been delivered yet. Patient is open to using until visit on 6/30.  At follow-up, she will need ambulatory saturation retested. She will also want to talk about her protonix . She feels that acid reflex is well controlled and wishes to decrease pill burden. She will also need education on how to use tyvaso  inhaler.

## 2023-12-03 NOTE — Telephone Encounter (Signed)
 Copied from CRM [id number]. Topic: General - Other >> Nov 30, 2023  2:40 PM Laura [name] wrote: Reason for CRM: Patient called and at first she refused to speak with me until I informed her that the clinic closed at 12 on Friday, she only wants to speak to May. After some convincing she told me that she wants someone to call her back about canceling the order for her oxygen, I informed the patient that per the note someone tried to reach out to her but the number was wrong so I updated the number. The patient is wanting to speak with provider about being retested before she is put on oxygen. could you please assist the patient? Call back number is [phone]

## 2023-12-04 ENCOUNTER — Ambulatory Visit (HOSPITAL_COMMUNITY)
Admission: RE | Admit: 2023-12-04 | Discharge: 2023-12-04 | Disposition: A | Discharge status: Home / Self Care | Source: Ambulatory Visit | Attending: Internal Medicine | Admitting: Internal Medicine

## 2023-12-04 DIAGNOSIS — J849 Interstitial pulmonary disease, unspecified: Secondary | ICD-10-CM | POA: Diagnosis not present

## 2023-12-04 DIAGNOSIS — I371 Nonrheumatic pulmonary valve insufficiency: Secondary | ICD-10-CM | POA: Diagnosis not present

## 2023-12-04 DIAGNOSIS — I272 Pulmonary hypertension, unspecified: Secondary | ICD-10-CM | POA: Insufficient documentation

## 2023-12-04 DIAGNOSIS — I080 Rheumatic disorders of both mitral and aortic valves: Secondary | ICD-10-CM | POA: Diagnosis not present

## 2023-12-04 LAB — ECHOCARDIOGRAM COMPLETE
Area-P 1/2: 2.94 cm2
S' Lateral: 2.9 cm

## 2023-12-05 ENCOUNTER — Telehealth: Payer: Self-pay | Admitting: Internal Medicine

## 2023-12-05 NOTE — Telephone Encounter (Signed)
 Connected [name] with [name] to [name] regarding a discrepancy in the documents

## 2023-12-05 NOTE — Telephone Encounter (Signed)
 Called to check on patient and go over ECHO results.  Surprisingly no evidence of PH on echo.  Does not mean she doesn't have it especially given severity of dilation of PA on chest CT imaging.  Discussed holding off on Tyvaso  until she gets a RHC.  Oxygen level at home running between 88-95% her oxygen has not been delivered yet but she says she should be able to get it shortly.  HRCT still pending but will be done before her follow up visit with us  on 6/30.  She can't get her appointment moved up with Dr. Waylan Haggard unless someone cancels.

## 2023-12-06 ENCOUNTER — Telehealth: Payer: Self-pay | Admitting: *Deleted

## 2023-12-06 NOTE — Telephone Encounter (Signed)
 Copied from CRM [id number]. Topic: General - Other >> Nov 30, 2023  2:40 PM Shamecia H wrote: Reason for CRM: Patient called and at first she refused to speak with me until I informed her that the clinic closed at 12 on Friday, she only wants to speak to May. After some convincing she told me that she wants someone to call her back about canceling the order for her oxygen, I informed the patient that per the note someone tried to reach out to her but the number was wrong so I updated the number. The patient is wanting to speak with provider about being retested before she is put on oxygen. could you please assist the patient? Call back number is [phone] >> Dec 06, 2023  2:58 PM Retta Caster wrote: Patient calling on update for this ASAP. Needs call back. [phone]

## 2023-12-06 NOTE — Addendum Note (Signed)
 Addended by: Kirt Pereyra B on: 12/06/2023 08:51 AM   Modules accepted: Orders

## 2023-12-07 ENCOUNTER — Encounter: Payer: Self-pay | Admitting: *Deleted

## 2023-12-10 ENCOUNTER — Ambulatory Visit (HOSPITAL_COMMUNITY)
Admission: RE | Admit: 2023-12-10 | Discharge: 2023-12-10 | Disposition: A | Discharge status: Home / Self Care | Source: Ambulatory Visit | Attending: Internal Medicine | Admitting: Internal Medicine

## 2023-12-10 DIAGNOSIS — J449 Chronic obstructive pulmonary disease, unspecified: Secondary | ICD-10-CM | POA: Diagnosis not present

## 2023-12-10 DIAGNOSIS — J849 Interstitial pulmonary disease, unspecified: Secondary | ICD-10-CM | POA: Insufficient documentation

## 2023-12-10 DIAGNOSIS — J432 Centrilobular emphysema: Secondary | ICD-10-CM | POA: Diagnosis not present

## 2023-12-10 DIAGNOSIS — N189 Chronic kidney disease, unspecified: Secondary | ICD-10-CM | POA: Diagnosis not present

## 2023-12-10 DIAGNOSIS — M545 Low back pain, unspecified: Secondary | ICD-10-CM | POA: Diagnosis not present

## 2023-12-10 DIAGNOSIS — R590 Localized enlarged lymph nodes: Secondary | ICD-10-CM | POA: Diagnosis not present

## 2023-12-11 NOTE — Progress Notes (Signed)
 Attempted to call patient with no success. Echo WNL no signs of right heart strain.

## 2023-12-12 ENCOUNTER — Telehealth: Payer: Self-pay | Admitting: *Deleted

## 2023-12-12 NOTE — Telephone Encounter (Signed)
 Received a call from [facility] at [facility] Radiology dept with CT Chest result. Final report is in EPIC.   IMPRESSION: 1. Pulmonary parenchymal pattern of interstitial lung disease, as detailed above, progressive from 09/27/2022. Findings are categorized as probable UIP per consensus guidelines: Diagnosis of Idiopathic Pulmonary Fibrosis: An Official ATS/ERS/JRS/ALAT Clinical Practice Guideline. Am JINNY Honey Crit Care Med Vol 198, Iss 5, [phone], Feb 17 2017. 2. Enlarging anterolateral left lower lobe nodule, worrisome for primary bronchogenic carcinoma. Consider PET in further evaluation. These results will be called to the ordering clinician or representative by the Radiologist Assistant, and communication documented in the PACS or [employer]. 3. Enlarged subcarinal lymph node. Recommend attention on follow-up imaging. 4. Chronic calcific pancreatitis. 5. Aortic atherosclerosis (ICD10-I70.0). Coronary artery calcification. 6. Enlarged pulmonic trunk, indicative of pulmonary arterial hypertension.

## 2023-12-13 ENCOUNTER — Telehealth: Payer: Self-pay | Admitting: *Deleted

## 2023-12-13 DIAGNOSIS — R911 Solitary pulmonary nodule: Secondary | ICD-10-CM

## 2023-12-13 NOTE — Telephone Encounter (Signed)
 Copied from CRM [id number]. Topic: Appointments - Scheduling Inquiry for Clinic >> Dec 13, 2023  3:06 PM Leila BROCKS wrote: Reason for CRM: Patient [phone] states Dr. Elicia primary care provider at [facility] urge patient to see Dr. Theophilus before 02/13/24 appointment due to spot in lung is bigger in CT scan 12/10/23 at [facility]. Patient is asking to be seen as soon as possible. Per CAL, send a message. Please advise and call back.    ----------------------------------------------------------------------- From previous Reason for Contact - Appt Info/Confirm: Patient/patient representative is calling for information regarding an appointment.  Dr. Theophilus, Do you want us  to schedule this patient in a 15 minute slot in July on your schedule or see if we can get her to see Dr. Shelah?  Please advise.  Thank you.

## 2023-12-14 NOTE — Telephone Encounter (Signed)
 I called and discussed CT findings with the patient.  CT shows worsening interstitial lung disease, pulmonary fibrosis and an enlarging nodule of concern in the left lower lobe  Margie- Please order PET scan for further evaluation.  She may need a bronchoscopy for evaluation pending PET scan results Please check schedule if we can get her in sooner with me or APP if there is a cancellation.  Ronell Boldin MD [facility] 12/14/2023, 1:12 PM

## 2023-12-14 NOTE — Telephone Encounter (Signed)
 PET has been ordered.  Front, please add patient to wait list. Thank you

## 2023-12-14 NOTE — Telephone Encounter (Signed)
 Patient is now added to wait list!

## 2023-12-17 ENCOUNTER — Ambulatory Visit: Admitting: Student

## 2023-12-17 VITALS — BP 135/89 | HR 90 | Temp 98.2°F | Ht 68.0 in | Wt 215.4 lb

## 2023-12-17 DIAGNOSIS — Z23 Encounter for immunization: Secondary | ICD-10-CM

## 2023-12-17 DIAGNOSIS — K219 Gastro-esophageal reflux disease without esophagitis: Secondary | ICD-10-CM

## 2023-12-17 DIAGNOSIS — R0982 Postnasal drip: Secondary | ICD-10-CM

## 2023-12-17 DIAGNOSIS — J849 Interstitial pulmonary disease, unspecified: Secondary | ICD-10-CM | POA: Diagnosis not present

## 2023-12-17 DIAGNOSIS — J84112 Idiopathic pulmonary fibrosis: Secondary | ICD-10-CM

## 2023-12-17 MED ORDER — PANTOPRAZOLE SODIUM 40 MG PO TBEC: 40.0000 mg | DELAYED_RELEASE_TABLET | Freq: Two times a day (BID) | ORAL | 2 refills | Status: DC

## 2023-12-17 NOTE — Progress Notes (Unsigned)
   Established Patient Office Visit  Subjective   Patient ID: AZARIYA FREEMAN, female    DOB: 1945-09-25  Age: 78 y.o. MRN: [id number]  No chief complaint on file.   HPI This is a 78 year old female with past medical history of interstitial lung disease, IPF, pulmonary nodules, atrial flutter, hypertension presents today  Past Medical History:  Diagnosis Date   Chronic back pain    mostly lower; left foot is numb all the time (11/22/2016)   Chronic kidney disease    ?renal cyst on MRI    GERD (gastroesophageal reflux disease)    Health care maintenance 12/31/2012   High cholesterol    IPF (idiopathic pulmonary fibrosis) (HCC)    Lymphadenopathy    documented on chest CT scan 12/15/2005   Substance abuse (HCC)    tobacco      ROS    Objective:     BP 135/89 (BP Location: Right Arm, Patient Position: Sitting, Cuff Size: Normal)   Pulse 90   Temp 98.2 F (36.8 C) (Oral)   Ht 5' 8 (1.727 m)   Wt 215 lb 6.4 oz (97.7 kg)   SpO2 (!) 88%   BMI 32.75 kg/m  BP Readings from Last 3 Encounters:  12/17/23 135/89  11/15/23 117/77  10/04/23 117/69   Wt Readings from Last 3 Encounters:  12/17/23 215 lb 6.4 oz (97.7 kg)  10/04/23 214 lb 4.8 oz (97.2 kg)  08/15/23 213 lb 3.2 oz (96.7 kg)   SpO2 Readings from Last 3 Encounters:  12/17/23 (!) 88%  11/15/23 (!) 87%  10/04/23 95%      Physical Exam  Pulmonary: Velcro-like crackles at the lung bases No results found for any visits on 12/17/23.  {Labs (Optional):23779}  The ASCVD Risk score (Arnett DK, et al., 2019) failed to calculate for the following reasons:   Risk score cannot be calculated because patient has a medical history suggesting prior/existing ASCVD    Assessment & Plan:  IPF/ILD Tobacco use - Follows Dr. Theophilus, was advised to start antifibrotic therapy; was seen in the clinic on 5/29 with concerns of dyspnea and hypoxia. - CT chest HS 12/10/2023: Pulmonary parenchymal pattern of ILD, UIP; also  noted enlarging anterolateral LLL nodule worrisome for primary bronchogenic carcinoma, with consideration for PET for further evaluation.  Also noted enlarged subcarinal lymph node.  Enlarged pulmonic trunk indicated for PAF. - NM PET ordered by Dr. Avis, may need bronchoscopy for evaluation. - Patient is currently on a wait list to be seen by Dr. Theophilus, pulmonology, currently scheduled for 02/13/2024 - Home oxygen: [employer] for small POC 4-6 pounds, DME re-education on how to use the machine  - Patient can not carry more than 5 pounds - Uses oxygen all night, oonly takes it off when in the kitchen and dressings  - Put in on 2L, above 90%    Dysphagia GERD - Eating meals, acid reflux, gurgling, metallic taste  - Worse after meal, seven 7 and ginger ale at night - NO fevers or chills. No abdominal pain  - No recent travels  - Coughing lots of phlegm, reports snifllinf - No nausea or vomiting  - Uses flonase   - Protonix  40 mg in the AM - Increase BID  - EGD    GAD 0 PHQ 9 = 3.  No concerns     Problem List Items Addressed This Visit   None   No follow-ups on file.    Toma Edwards, DO

## 2023-12-17 NOTE — Progress Notes (Unsigned)
 SATURATION QUALIFICATIONS: (This note is used to comply with regulatory documentation for home oxygen)  Patient Saturations on Room Air at Rest = 89%   Patient Saturations on Room Air while Ambulating = 85%  Patient Saturations on 2 Liters of oxygen while Ambulating = 86%  Patient Saturations on 3 Liters of oxygen while Ambulating = 90%  Please briefly explain why patient needs home oxygen: see physician office visit notes for explanation

## 2023-12-17 NOTE — Patient Instructions (Signed)
 Thank you, [name] for allowing us  to provide your care today. Today we discussed:  You will receive a call about PET scan for your lungs [employer] services will reach out to you for your oxygen needs Please take Protonix  40 mg two times daily before meals   I have ordered the following labs for you:  Lab Orders  No laboratory test(s) ordered today     Tests ordered today:  None   Referrals ordered today:   Referral Orders  No referral(s) requested today     I have ordered the following medication/changed the following medications:   Stop the following medications: Medications Discontinued During This Encounter  Medication Reason   pantoprazole  (PROTONIX ) 40 MG tablet Reorder     Start the following medications: Meds ordered this encounter  Medications   pantoprazole  (PROTONIX ) 40 MG tablet    Sig: Take 1 tablet (40 mg total) by mouth 2 (two) times daily before a meal.    Dispense:  90 tablet    Refill:  2     Follow up: 3-4 months for follow up on GERD and Lungs    Remember:   Should you have any questions or concerns please call the internal medicine clinic at [phone].     Rayann Atway, D.O. [facility]

## 2023-12-18 NOTE — Addendum Note (Signed)
 Addended by: Corean Yoshimura L on: 12/18/2023 09:10 AM   Modules accepted: Level of Service

## 2023-12-18 NOTE — Assessment & Plan Note (Signed)
 Patient follows Dr. Theophilus, was advised to start antifibrotic therapy, was seen in the clinic on 5/29 with concern of dyspnea and hypoxia.  High-resolution CT chest on 12/10/2023 noted pulmonary parenchymal pattern of ILD, UIP; also noted enlarging anterolateral LLL nodule worrisome for primary bronchogenic carcinoma, with consideration for PET for further evaluation.  The findings were also for enlarged subcarinal lymph node, enlarged pulmonic trunk indicated for PAH.  Dr. Theophilus, pulmonologist informed, plan for NM PET to further evaluate the pulmonary nodule.  Per Dr. Jiles note, patient may need bronchoscopy for evaluation based on the PET scan findings.  Patient reports that she is currently using home oxygen intermittently, usually when she is resting she places 2 L oxygen on and she saturates above 90%, and takes off during activity and while she is in the kitchen.  Reports that the oxygen tanks are too heavy for her.   On exam, patient saturated at 86% on a walk test with 2 L; patient was trialed again with a walk test on 3 L, she saturate above 90%.  On lung evaluation, patient has a Velcro-like crackles that is evident on bibasilar lung fields.  - Our referrals team reached out to [employer] for small oxygen tanks 4-6 pounds -DME order placed for oxygen and reeducation on how to use the machine -Patient was advised to use 3 L oxygen with activity -Plan for NM PET scan of the lung -Plan for follow-up with pulmonologist, Dr. Theophilus

## 2023-12-18 NOTE — Progress Notes (Signed)
 Internal Medicine Clinic Attending  Case discussed with the resident at the time of the visit.  We reviewed the resident's history and exam and pertinent patient test results.  I agree with the assessment, diagnosis, and plan of care documented in the resident's note.     Patient's CT was concerning for lung cancer. Our team has discussed this with Dr. Theophilus with Pulmonology, and patient is now on the wait list to be seen by Pulm earlier. Pulm office has also ordered PET scan. Dr. Toma answered patient's questions today & helped coordinate care for appropriate O2 tanks.

## 2023-12-18 NOTE — Assessment & Plan Note (Signed)
 Patient reports chronic GERD, reports symptoms of acid reflux that is worst right after eating meals and at night when laying flat.  Patient reports that sometimes she feels gurgling in the back of her throat and metallic taste.  Reports that it has made it harder for her to eat meals and swallow food.  Denies any fever or chills.  Denies any abdominal pain no recent travels.  Reports in the morning she is coughing up lots of phlegm, reports postnasal drip.  No nausea or vomiting.  Reports that she takes Protonix  40 mg in the morning.  No EGD per chart review. -Increase Protonix  40 mg to twice daily dosing -Will reevaluate in the next office visit, if patient continues to have acid reflux even with 40 mg twice daily, next plan for EGD with GI

## 2023-12-24 ENCOUNTER — Telehealth: Payer: Self-pay | Admitting: *Deleted

## 2023-12-24 NOTE — Telephone Encounter (Unsigned)
 Copied from CRM [id number]. Topic: Clinical - Order For Equipment >> Dec 24, 2023 11:39 AM [name] wrote: Reason for CRM: Patient called in from [phone], she states that she dropped off some paperwork this more, but she is needing some assistance with her oxygent tanks she states its too loud and very disturbing, patient states she is also requesting assistance with getting another tank because she does have a disability and is unable to carry that tank or move it around because it's extremely heavy.

## 2023-12-24 NOTE — Telephone Encounter (Signed)
 pT HAS BEE   Copied from CRM [id number]. Topic: General - Other >> Dec 19, 2023  4:44 PM Fredrica W wrote: Reason for CRM: Patient called states she forgot to leave form for disability plaque. Will bring it buy one day this week to be completed by provider. Also  requested rush put on oxygen order due to patient not able to carry big tank.

## 2023-12-25 ENCOUNTER — Ambulatory Visit
Admission: RE | Admit: 2023-12-25 | Discharge: 2023-12-25 | Disposition: A | Discharge status: Home / Self Care | Source: Ambulatory Visit | Attending: Pulmonary Disease | Admitting: Pulmonary Disease

## 2023-12-25 DIAGNOSIS — N83201 Unspecified ovarian cyst, right side: Secondary | ICD-10-CM | POA: Diagnosis not present

## 2023-12-25 DIAGNOSIS — L989 Disorder of the skin and subcutaneous tissue, unspecified: Secondary | ICD-10-CM | POA: Insufficient documentation

## 2023-12-25 DIAGNOSIS — R590 Localized enlarged lymph nodes: Secondary | ICD-10-CM | POA: Diagnosis not present

## 2023-12-25 DIAGNOSIS — R918 Other nonspecific abnormal finding of lung field: Secondary | ICD-10-CM | POA: Diagnosis not present

## 2023-12-25 DIAGNOSIS — R911 Solitary pulmonary nodule: Secondary | ICD-10-CM | POA: Insufficient documentation

## 2023-12-25 DIAGNOSIS — J849 Interstitial pulmonary disease, unspecified: Secondary | ICD-10-CM | POA: Diagnosis not present

## 2023-12-25 LAB — GLUCOSE, CAPILLARY: Glucose-Capillary: 95 mg/dL (ref 70–99)

## 2023-12-25 MED ORDER — FLUDEOXYGLUCOSE F - 18 (FDG) INJECTION
11.9700 | Freq: Once | INTRAVENOUS | Status: AC | PRN
  Administered 2023-12-25: 11.97 via INTRAVENOUS

## 2023-12-27 ENCOUNTER — Ambulatory Visit: Payer: Self-pay | Admitting: Pulmonary Disease

## 2023-12-27 ENCOUNTER — Ambulatory Visit: Payer: Self-pay

## 2023-12-27 DIAGNOSIS — J849 Interstitial pulmonary disease, unspecified: Secondary | ICD-10-CM

## 2023-12-27 DIAGNOSIS — R911 Solitary pulmonary nodule: Secondary | ICD-10-CM

## 2023-12-27 NOTE — Telephone Encounter (Signed)
 Couldn't finish triage as pt is snappy and brief, while trying to answer triage assessment questions pt states that the oxygen is the beginning of the problem and she doesn't know why she is on oxygen. When ask when the cough started she states when she started the oxygen, when asked how bad the cough is she stated listen just have the doctor call me and then disconnected the call.

## 2023-12-27 NOTE — Telephone Encounter (Signed)
 FYI Only or Action Required?: Action required by provider: clinical question for provider and wants doctor to call her.  Patient was last seen in primary care on 12/17/2023 by Heddy Barren, DO.  Called Nurse Triage reporting Cough.    Interventions attempted: Nothing.  Symptoms are: unchanged.  Triage Disposition: No disposition on file.  Patient/caregiver understands and will follow disposition?:       Copied from CRM [id number]. Topic: Clinical - Red Word Triage >> Dec 27, 2023  2:18 PM Cherylann RAMAN wrote: Red Word that prompted transfer to Nurse Triage: Patient states that the prescribed oxygen is making her feel worse. She states that she feels like she has no energy and that it is not doing what is supposed to be doing. Patient indicates she feels as though it is giving her pneumonia, making her cold, taking her energy, and just making her overall health worse. As she does not know why she was prescribed the oxygen. Reason for Disposition  Cough has been present for > 3 weeks  Answer Assessment - Initial Assessment Questions 1. ONSET: When did the cough begin?      Started when she got on oxygen 2. SEVERITY: How bad is the cough today?      *No Answer* 3. SPUTUM: Describe the color of your sputum (e.g., none, dry cough; clear, white, yellow, green)     *No Answer* 4. HEMOPTYSIS: Are you coughing up any blood? If Yes, ask: How much? (e.g., flecks, streaks, tablespoons, etc.)     *No Answer* 5. DIFFICULTY BREATHING: Are you having difficulty breathing? If Yes, ask: How bad is it? (e.g., mild, moderate, severe)      *No Answer* 6. FEVER: Do you have a fever? If Yes, ask: What is your temperature, how was it measured, and when did it start?     *No Answer* 7. CARDIAC HISTORY: Do you have any history of heart disease? (e.g., heart attack, congestive heart failure)      *No Answer* 8. LUNG HISTORY: Do you have any history of lung disease?  (e.g., pulmonary  embolus, asthma, emphysema)     *No Answer* 9. PE RISK FACTORS: Do you have a history of blood clots? (or: recent major surgery, recent prolonged travel, bedridden)     *No Answer* 10. OTHER SYMPTOMS: Do you have any other symptoms? (e.g., runny nose, wheezing, chest pain)       *No Answer* 11. PREGNANCY: Is there any chance you are pregnant? When was your last menstrual period?       *No Answer* 12. TRAVEL: Have you traveled out of the country in the last month? (e.g., travel history, exposures)       *No Answer*  Protocols used: Cough - Acute Productive-A-AH

## 2023-12-31 ENCOUNTER — Other Ambulatory Visit: Payer: Self-pay | Admitting: Student

## 2023-12-31 ENCOUNTER — Telehealth: Payer: Self-pay | Admitting: *Deleted

## 2023-12-31 DIAGNOSIS — J849 Interstitial pulmonary disease, unspecified: Secondary | ICD-10-CM

## 2023-12-31 DIAGNOSIS — J84112 Idiopathic pulmonary fibrosis: Secondary | ICD-10-CM

## 2023-12-31 NOTE — Telephone Encounter (Signed)
 Copied from CRM [id number]. Topic: General - Other >> Dec 31, 2023 11:29 AM Mercer PEDLAR wrote: Reason for CRM: Aldean From [employer] called stating that they received an order for reeducation for oxygen tanks. Small tanks needed but needs order for POC evaluation. Needs to say 1-6 pulse dose if qualified pleaser dispense.   Callback: [phone] Fax: [phone]

## 2023-12-31 NOTE — Addendum Note (Signed)
 Addended by: Mi Balla C on: 12/31/2023 12:15 PM   Modules accepted: Orders

## 2024-01-03 ENCOUNTER — Other Ambulatory Visit: Payer: Self-pay

## 2024-01-03 DIAGNOSIS — K219 Gastro-esophageal reflux disease without esophagitis: Secondary | ICD-10-CM

## 2024-01-03 DIAGNOSIS — E7800 Pure hypercholesterolemia, unspecified: Secondary | ICD-10-CM

## 2024-01-03 MED ORDER — PANTOPRAZOLE SODIUM 40 MG PO TBEC: 40.0000 mg | DELAYED_RELEASE_TABLET | Freq: Two times a day (BID) | ORAL | 2 refills | Status: DC

## 2024-01-03 MED ORDER — ROSUVASTATIN CALCIUM 20 MG PO TABS: 20.0000 mg | ORAL_TABLET | Freq: Every day | ORAL | 3 refills | Status: AC

## 2024-01-07 ENCOUNTER — Telehealth: Payer: Self-pay | Admitting: *Deleted

## 2024-01-07 NOTE — Telephone Encounter (Unsigned)
 Copied from CRM [id number]. Topic: Clinical - Medical Advice >> Jan 07, 2024  2:10 PM Laura [name] wrote: Reason for CRM: Patient states she starts sneezing and getting congested, and watery eyes whenever she uses her oxygen, please call her at [phone]

## 2024-02-11 ENCOUNTER — Ambulatory Visit: Payer: Self-pay

## 2024-02-11 NOTE — Telephone Encounter (Addendum)
 FYI Only or Action Required?: Action required by provider: update on patient condition.  Patient was last seen in primary care on 12/17/2023 by Heddy Barren, DO.  Called Nurse Triage reporting Shortness of Breath.  Symptoms began several months ago.  Interventions attempted: Prescription medications: home oxygen.  Symptoms are: stable.  Triage Disposition: See PCP Within 2 Weeks  Patient/caregiver understands and will follow disposition?: Yes, but will wait- Patient says she will f/u with PCP after her Pulm appt on 8/27. Pt also says she purchased a pulse ox to make sure her oxygen stays above 90% as advised by PCP.  Copied from CRM [id number]. Topic: Clinical - Medical Advice >> Feb 11, 2024  1:33 PM Mercer PEDLAR wrote: Reason for CRM: Patient stated that she was prescribed oxygen but it is not helping with her symptoms, patient would like callback to address concerns.   Answer Assessment - Initial Assessment Questions 1. RESPIRATORY STATUS: Describe your breathing? (e.g., wheezing, shortness of breath, unable to speak, severe coughing)      Shortness of breath  2. ONSET: When did this breathing problem begin?      Ongoing for over a month'  3. PATTERN Does the difficult breathing come and go, or has it been constant since it started?      Comes and goes  4. SEVERITY: How bad is your breathing? (e.g., mild, moderate, severe)      Mild to moderate  5. RECURRENT SYMPTOM: Have you had difficulty breathing before? If Yes, ask: When was the last time? and What happened that time?      Yes, started on oxygen by PCP  6. CARDIAC HISTORY: Do you have any history of heart disease? (e.g., heart attack, angina, bypass surgery, angioplasty)      No  7. LUNG HISTORY: Do you have any history of lung disease?  (e.g., pulmonary embolus, asthma, emphysema)    IPF,   8. CAUSE: What do you think is causing the breathing problem?      Unsure of cause, has f/u with pulmonology  on 8/27  9. OTHER SYMPTOMS: Do you have any other symptoms? (e.g., chest pain, cough, dizziness, fever, runny nose)     No  10. O2 SATURATION MONITOR:  Do you use an oxygen saturation monitor (pulse oximeter) at home? If Yes, ask: What is your reading (oxygen level) today? What is your usual oxygen saturation reading? (e.g., 95%)       Started at 88%-90%  11. PREGNANCY: Is there any chance you are pregnant? When was your last menstrual period?       No  12. TRAVEL: Have you traveled out of the country in the last month? (e.g., travel history, exposures)       no  Protocols used: Breathing Difficulty-A-A

## 2024-02-11 NOTE — Telephone Encounter (Signed)
 Reason for Disposition . [1] MILD longstanding difficulty breathing (e.g., minimal/no SOB at rest, SOB with walking, pulse < 100) AND [2] SAME as normal  Answer Assessment - Initial Assessment Questions 1. RESPIRATORY STATUS: Describe your breathing? (e.g., wheezing, shortness of breath, unable to speak, severe coughing)      Shortness of breath  2. ONSET: When did this breathing problem begin?      Ongoing for over a month'  3. PATTERN Does the difficult breathing come and go, or has it been constant since it started?      Comes and goes  4. SEVERITY: How bad is your breathing? (e.g., mild, moderate, severe)      Mild to moderate  5. RECURRENT SYMPTOM: Have you had difficulty breathing before? If Yes, ask: When was the last time? and What happened that time?      Yes, started on oxygen by PCP  6. CARDIAC HISTORY: Do you have any history of heart disease? (e.g., heart attack, angina, bypass surgery, angioplasty)      No  7. LUNG HISTORY: Do you have any history of lung disease?  (e.g., pulmonary embolus, asthma, emphysema)    IPF,   8. CAUSE: What do you think is causing the breathing problem?      Unsure of cause, has f/u with pulmonology on 8/27  9. OTHER SYMPTOMS: Do you have any other symptoms? (e.g., chest pain, cough, dizziness, fever, runny nose)     No  10. O2 SATURATION MONITOR:  Do you use an oxygen saturation monitor (pulse oximeter) at home? If Yes, ask: What is your reading (oxygen level) today? What is your usual oxygen saturation reading? (e.g., 95%)       Started at 88%-90%  11. PREGNANCY: Is there any chance you are pregnant? When was your last menstrual period?       No  12. TRAVEL: Have you traveled out of the country in the last month? (e.g., travel history, exposures)       no  Protocols used: Breathing Difficulty-A-AH

## 2024-02-12 NOTE — Telephone Encounter (Signed)
 Call to patient staes has the oxygen drops when she moves around,  Does not want to wear the Oxygen all thew time.  Asked patient to make sure and use her Oxygen when she moves around.  States when sitting it stays above the 90's. Has a Pulse ox that she keeps check on her levels.  Patient has an appointment with Pulmonary tomorrow morning.  Encouraged her to go to that appointment.  Also patient asked if she can get a smaller tank as she says the tank she has is too heavy. Patient to ask about at her appointment on tomorrow ans see who will need to place the order for a smaller tank.

## 2024-02-13 ENCOUNTER — Telehealth: Payer: Self-pay | Admitting: Pulmonary Disease

## 2024-02-13 ENCOUNTER — Ambulatory Visit (INDEPENDENT_AMBULATORY_CARE_PROVIDER_SITE_OTHER): Admitting: Pulmonary Disease

## 2024-02-13 ENCOUNTER — Encounter: Payer: Self-pay | Admitting: Pulmonary Disease

## 2024-02-13 VITALS — BP 118/88 | HR 83 | Temp 98.5°F | Ht 68.0 in | Wt 220.0 lb

## 2024-02-13 DIAGNOSIS — Z5181 Encounter for therapeutic drug level monitoring: Secondary | ICD-10-CM | POA: Diagnosis not present

## 2024-02-13 DIAGNOSIS — R0689 Other abnormalities of breathing: Secondary | ICD-10-CM

## 2024-02-13 DIAGNOSIS — R0600 Dyspnea, unspecified: Secondary | ICD-10-CM | POA: Diagnosis not present

## 2024-02-13 DIAGNOSIS — J849 Interstitial pulmonary disease, unspecified: Secondary | ICD-10-CM | POA: Diagnosis not present

## 2024-02-13 LAB — BRAIN NATRIURETIC PEPTIDE: Pro B Natriuretic peptide (BNP): 98 pg/mL (ref 0.0–100.0)

## 2024-02-13 LAB — COMPREHENSIVE METABOLIC PANEL WITH GFR
ALT: 10 U/L (ref 0–35)
AST: 14 U/L (ref 0–37)
Albumin: 3.2 g/dL — ABNORMAL LOW (ref 3.5–5.2)
Alkaline Phosphatase: 83 U/L (ref 39–117)
BUN: 11 mg/dL (ref 6–23)
CO2: 28 meq/L (ref 19–32)
Calcium: 8.4 mg/dL (ref 8.4–10.5)
Chloride: 106 meq/L (ref 96–112)
Creatinine, Ser: 0.84 mg/dL (ref 0.40–1.20)
GFR: 67.76 mL/min (ref >60.00)
Glucose, Bld: 88 mg/dL (ref 70–99)
Potassium: 4.3 meq/L (ref 3.5–5.1)
Sodium: 140 meq/L (ref 135–145)
Total Bilirubin: 0.4 mg/dL (ref 0.2–1.2)
Total Protein: 6.9 g/dL (ref 6.0–8.3)

## 2024-02-13 NOTE — Patient Instructions (Signed)
  VISIT SUMMARY: During today's visit, we discussed your idiopathic pulmonary fibrosis and the associated symptoms and treatments. We reviewed your current oxygen therapy, the progression of your lung condition, and the recent findings from your CT and PET scans. We also talked about starting a new medication to help slow the progression of your disease and considered the possibility of clinical trials.  YOUR PLAN: -IDIOPATHIC PULMONARY FIBROSIS WITH CHRONIC HYPOXEMIA: Idiopathic pulmonary fibrosis is a lung disease that causes scarring of the lung tissue, leading to difficulty breathing and low oxygen levels. We will start you on nintedanib to help slow the progression of the disease. We will also order baseline liver function tests and monitor your liver function monthly for the first six months. Additionally, we will arrange for smaller oxygen tanks to make your oxygen therapy more manageable. If you are interested, we can evaluate you for clinical trials as an alternative treatment option.  -STABLE LEFT LUNG NODULE: A nodule in your left lung was observed on your recent CT scan, which is likely scar tissue from your pulmonary fibrosis. The PET scan showed no activity, indicating that it is not likely to be cancerous.  -CLUBBING OF FINGERS SECONDARY TO LUNG DISEASE: Clubbing of the fingers is a condition where the tips of the fingers become enlarged and the nails curve around the fingertips. This is often associated with chronic lung diseases like idiopathic pulmonary fibrosis and low oxygen levels.  -GOALS OF CARE: We discussed the progressive nature of your lung disease and the potential need for increased oxygen support in the future. You have decided to start nintedanib to potentially slow the disease progression, understanding the risks and side effects. You are also open to considering clinical trials as an alternative treatment option.  INSTRUCTIONS: Please start taking nintedanib as  prescribed. We will order baseline liver function tests and monitor your liver function monthly for the first six months. We will also arrange for smaller oxygen tanks for your convenience. If you are interested in clinical trials, let us  know so we can evaluate your eligibility. Follow up with us  as scheduled to monitor your condition and adjust your treatment plan as needed.

## 2024-02-13 NOTE — Telephone Encounter (Signed)
 Per Avelina at [employer]-  The tanks the patient has are the smallest continuous option we have currently.

## 2024-02-13 NOTE — Progress Notes (Signed)
 Laura [name]    [id number]    [birth year]  Primary Care Physician:Tawkaliyar, Toma, DO  Referring Physician: Kenn Pagan, DO No address on file  Chief complaint: Follow-up for abnormal CT, interstitial lung disease  HPI: 78 y.o. who  has a past medical history of Chronic back pain, Chronic kidney disease, GERD (gastroesophageal reflux disease), Health care maintenance (12/31/2012), High cholesterol, IPF (idiopathic pulmonary fibrosis) (HCC), Lymphadenopathy, and Substance abuse (HCC).   Referred for evaluation of abnormal imaging on low-dose screening CT which showed pulmonary fibrosis History notable for ongoing smoking, marijuana use.  She used cocaine until 2021 Prescribed nintedanib in 2024 but after review of side effects she declined to start therapy.  Interim history: Discussed the use of AI scribe software for clinical note transcription with the patient, who gave verbal consent to proceed.  History of Present Illness Laura [name] is a 78 year old female with idiopathic pulmonary fibrosis who presents for follow-up regarding her lung condition and oxygen therapy.  Dyspnea and functional status - Dyspnea on exertion, described as 'I breathe real hard' when active - Symptoms improve with rest and breathing exercises - No oxygen use during the visit due to finding the therapy cumbersome  Oxygen therapy - Initiated on oxygen therapy approximately one month ago for oxygen saturation dropping to 88% - Uses oxygen at night as instructed - Finds current oxygen setup impractical for her lifestyle - Did not qualify for a portable oxygen concentrator on evaluation at the DME company - Requests smaller oxygen tanks  Pulmonary fibrosis progression - Idiopathic pulmonary fibrosis with progressive course - Worsening lung scarring on CT scan in June 2025 compared to 2023 - Previously prescribed nintedanib (Ofev ) to slow disease progression but declined due to  concerns about side effects, including liver issues and gastrointestinal symptoms  Pulmonary nodule - CT scan in June 2025 revealed a left lung nodule - PET scan in early July 2025 showed no activity, suggesting nodule may be related to scarring from pulmonary fibrosis  Pulmonary hypertension evaluation - Echocardiogram in June 2025 showed no evidence of pulmonary hypertension    Relevant pulmonary history Pets: Had a dog in the past Occupation: Retired Building control surveyor ILD questionnaire 09/07/2022: No mold, hot tub, Jacuzzi.  No feather pillows or comforters. Smoking history: 28-pack-year smoker.  Smokes up to 8 cigarettes/day and 1-2 marijuana joints per day Travel history: Previously lived in [place]  in [place] .  No significant recent travel Relevant family history: Her siblings have unspecified lung issues.  The are smokers.  Outpatient Encounter Medications as of 02/13/2024  Medication Sig   aspirin  81 MG chewable tablet Chew 81 mg by mouth daily.   fluticasone  (FLONASE ) 50 MCG/ACT nasal spray USE 1-2 SPRAYS IN EACH NOSTRIL DAILY   gabapentin  (NEURONTIN ) 600 MG tablet Take 1 tablet (600 mg total) by mouth 4 (four) times daily.   GEMTESA  75 MG TABS Take 1 tablet by mouth daily.   metoprolol succinate (TOPROL-XL) 25 MG 24 hr tablet Take 50 mg by mouth daily.   pantoprazole  (PROTONIX ) 40 MG tablet Take 1 tablet (40 mg total) by mouth 2 (two) times daily before a meal.   rosuvastatin  (CRESTOR ) 20 MG tablet Take 1 tablet (20 mg total) by mouth daily.   albuterol  (VENTOLIN  HFA) 108 (90 Base) MCG/ACT inhaler TAKE 2 PUFFS BY MOUTH EVERY 6 HOURS AS NEEDED FOR WHEEZE OR SHORTNESS OF BREATH   diclofenac  Sodium (VOLTAREN ) 1 % GEL Apply  4 g topically 4 (four) times daily.   MEDROL 4 MG TBPK tablet Take by mouth as directed.   Sodium Chloride-Sodium Bicarb (NETI POT SINUS WASH) 2300-700 MG KIT Place 1 kit into the nose daily.   traMADol (ULTRAM) 50 MG tablet Take 50 mg by  mouth 3 (three) times daily as needed.   No facility-administered encounter medications on file as of 02/13/2024.   Vitals:   02/13/24 1000  BP: 118/88  Pulse: 83  Temp: 98.5 F (36.9 C)  Height: 5' 8 (1.727 m)  Weight: 220 lb (99.8 kg)  SpO2: 95%  TempSrc: Oral  BMI (Calculated): 33.46    Physical Exam GEN: No acute distress CV: Regular rate and rhythm, no murmurs LUNGS: Crackles heard in lungs, normal respiratory effort SKIN JOINTS: Warm and dry, no rash EXTREMITIES: Clubbing in fingernails    Data Reviewed: Imaging: CT chest 12/09/2014-mild emphysema, subcentimeter pulmonary nodule, subtle reticulation at the lung base CT chest 05/09/2022-stable pulmonary nodule, basilar predominant reticular opacities with traction bronchiectasis appears progressive from 2016. High resolution CT 09/27/2022-mild pulmonary fibrosis and probable UIP pattern, dilated pulmonary artery, emphysema. High-resolution CT 03/28/2023-pulmonary fibrosis and probable UIP pattern, emphysema High-resolution CT 12/10/2023-pulmonary fibrosis in probable UIP pattern.  Progressive from 2024, enlarging left lower lobe nodule, subcarinal lymphadenopathy, enlarged pulmonary trunk PET scan 12/25/2023-no metabolic activity in the left lower lobe nodule,-metabolic cutaneous lesion in the left thigh suggestive of cellulitis I have reviewed the images personally.  PFTs: 05/23/2022 FVC 2.60 [80%], FEV1 2.13 [87%], F/F82, TLC 5.98 [108%], DLCO 13.36 [48%] Severe diffusion defect  Labs: CTD serologies 09/08/2022 significant only for ANA 1: 80, cytoplasmic  Cardiac: Echocardiogram  10/03/2022-LVEF 55-60%, grade 1 diastolic dysfunction, normal PA systolic pressure 12/04/2023-LVEF 60 to 65%, grade 1 diastolic dysfunction.  Normal RV size and function.  Normal PA systolic pressure Assessment & Plan Idiopathic pulmonary fibrosis with chronic hypoxemia requiring supplemental oxygen CT shows pulmonary fibrosis in probable UIP  pattern.  There are subtle changes back in 2016 and appears to have progressed since then.  There are no significant exposures or symptoms of autoimmune disease.  Titers are low which is likely nonsignificant as she does not have any evidence of lupus PFTs reviewed with isolated diffusion defect with normal lung volumes and spirometry.  No evidence of pulmonary hypertension on echocardiogram This is likely to be IPF and as she has progression on CT scan she is a candidate for antifibrotic therapy.   She has worsening lung scarring on CT scan from June 2025 compared to 2023. Chronic hypoxemia with oxygen saturation dropping to 88% and has been started on supplemental oxygen. Previously declined nintedanib due to side effects but now agrees to start therapy to slow disease progression. Echocardiogram in June 2025 showed no pulmonary hypertension. Discussed clinical trials as an alternative treatment option. - Initiate nintedanib therapy - Order baseline liver function tests - Monitor liver function monthly for the first six months - Order smaller oxygen tanks - Evaluate for clinical trials if interested - Follow-up PFTs  Stable left lung nodule, likely scar Left lung nodule observed on CT scan in June 2025, likely scar tissue from pulmonary fibrosis. PET scan in early July 2025 showed no activity, indicating low suspicion for malignancy.  Clubbing of fingers secondary to lung disease Clubbing of fingers likely secondary to chronic lung disease and hypoxemia associated with idiopathic pulmonary fibrosis.  Goals of Care Discussed the progressive nature of IPF and potential need for increased oxygen support. She is aware of  the potential decline in lung function leading to increased oxygen dependency and mortality. Decided to start nintedanib to potentially slow disease progression, understanding the risks and side effects. Open to considering clinical trials as an alternative treatment option. No  current evidence of pulmonary hypertension, which is favorable for her prognosis.   Plan/Recommendations: Initiate nintedanib Check CMP, BNP for therapeutic drug monitoring PFTs and follow-up in 3 months  Lonna Coder MD [facility] 02/13/2024, 10:08 AM  CC: Masters, Izetta, DO

## 2024-02-14 ENCOUNTER — Ambulatory Visit: Payer: Self-pay

## 2024-02-14 NOTE — Telephone Encounter (Addendum)
ATC pt--unable to leave vm due to mailbox not being setup.  

## 2024-02-14 NOTE — Telephone Encounter (Signed)
 I called pt who stated she wants to talk to the doctor about oxygen and her x-rays. She has an appt 02/21/24.

## 2024-02-14 NOTE — Telephone Encounter (Signed)
 FYI Only or Action Required?: Action required by provider: request for appointment.  Patient was last seen in primary care on 12/17/2023 by Heddy Barren, DO.  Called Nurse Triage reporting No chief complaint on file..  Symptoms began yesterday.  Interventions attempted: Nothing.  Symptoms are: unchanged.  Triage Disposition: See PCP Within 2 Weeks  Patient/caregiver understands and will follow disposition?: YesCopied from [employer] [phone]. Topic: Clinical - Red Word Triage >> Feb 14, 2024 10:39 AM Zane F wrote: [facility] that prompted transfer to Nurse Triage:   Bad reaction to oxygen; makes her fatigue; when she takes the oxygen off  She has more trouble breathing than when she doesn't use it Reason for Disposition  Requesting regular office appointment  Answer Assessment - Initial Assessment Questions 1. REASON FOR CALL: What is the main reason for your call? or How can I best help you?   Pt had questions from pulmonary appt yesterday. Pt wants to see xray and concerns of consistent use of oxygen. When pt wears it she feels worse. Pt wants to explore ideas of why and what can she do to help with that without uses oxygen. CAL utilized due to PCP not populating. Appt is 9/4.  Protocols used: Information Only Call - No Triage-A-AH

## 2024-02-14 NOTE — Telephone Encounter (Signed)
 Pt unable to see pcp Dr Toma, but agreed to see Dr Hadassah Ala. Pt wishes to talk about her 02/13/2024 visit with Pulmonology and PET scan results. Appt given for 02/21/2024 at 0915.  No further action needed at this time, phone call complete.Laura Godina Cassady8/28/202511:12 AM

## 2024-02-14 NOTE — Telephone Encounter (Signed)
 Pt stated she does not wear the oxygen all the time wants to discuss her x-rays. Stated she will be ok until nest week.

## 2024-02-15 ENCOUNTER — Telehealth: Payer: Self-pay

## 2024-02-15 NOTE — Telephone Encounter (Signed)
 Copied from CRM [id number]. Topic: Clinical - Lab/Test Results >> Feb 14, 2024 11:09 AM Devaughn RAMAN wrote: Reason for CRM: Patient called regarding CT scan, patient stated she would like to see a picture of her CT scan in her MyChart. Patient stated her results are there but she would like to see the picture. Patient stated she has questions regarding her CT scan. Patient stated she wants to know the ways she can improve her condition and the reason why she needs to use oxygen, she stated she feels worst using her oxygen, patient would like other alternatives then the oxygen.   Tried to call patient back x1  No answer and VM full   ( She can request from where she had her CT done for a copy of it and O2 should be at 3L as needed )

## 2024-02-21 ENCOUNTER — Ambulatory Visit (INDEPENDENT_AMBULATORY_CARE_PROVIDER_SITE_OTHER): Admitting: Student

## 2024-02-21 ENCOUNTER — Encounter: Payer: Self-pay | Admitting: Student

## 2024-02-21 ENCOUNTER — Ambulatory Visit: Payer: Self-pay | Admitting: Pulmonary Disease

## 2024-02-21 VITALS — BP 129/71 | HR 83 | Temp 97.7°F | Ht 68.0 in | Wt 215.0 lb

## 2024-02-21 DIAGNOSIS — J84112 Idiopathic pulmonary fibrosis: Secondary | ICD-10-CM | POA: Diagnosis not present

## 2024-02-21 DIAGNOSIS — J849 Interstitial pulmonary disease, unspecified: Secondary | ICD-10-CM

## 2024-02-21 DIAGNOSIS — J9611 Chronic respiratory failure with hypoxia: Secondary | ICD-10-CM

## 2024-02-21 MED ORDER — ALBUTEROL SULFATE HFA 108 (90 BASE) MCG/ACT IN AERS: 2.0000 | INHALATION_SPRAY | RESPIRATORY_TRACT | 2 refills | Status: AC | PRN

## 2024-02-21 NOTE — Assessment & Plan Note (Signed)
 Patient presented to clinic with confusion about the rapid progression of disease, recent nightly oxygen use, and about the medications offered in consultation with pulmonology. We discussed the process of fibrosis, reviewed her 2024 and 2025 scans together, and the role of the Tki Dr. Theophilus offered during their 01/2024 visit.   At this point, patient is frustrated with communication with [facility] and specialists. I reviewed the AVS where different doctors describe the visit discussions and next steps. Patient understands role of Tki and does not like the side effect profile. She understands the disease process and that her O2 therapy needs may increase with time.   We discussed the role of palliative care in advance care planning, goals of care, symptom management. She would like to start this process to be ready. Her mood is sad but her thought content is coherent; I will place the referral.  There are worsening emphysematous changes in CT; last PFTs with isolated diffusion defect with normal lung volumes. She is a former smoker. I see that Dr. Theophilus has ordered PFTs to be do in the near future.  Wonder if this is a COPD-IPF overlap presentation. We discuss using albuterol  inhaler for now.   - Visit with Dr. Theophilus in December 2025 - O2 therapy at night and with Sao2 <89% - Albuterol  inhaler as needed for wheezing - Referral to palliative care - PFTs with pulmonology - Will message Dr. Theophilus to inquire about eligibility for pulm rehab

## 2024-02-21 NOTE — Patient Instructions (Addendum)
 Thank you, Ms.Mckinzey GEORGINE WILTSE for allowing us  to provide your care today. Today we discussed   Pulmonary fibrosis: - This is a progressive disease even with treatment. I have sent the referral to palliative care. They should help you with this - I will email Dr. Theophilus about the potential COPD and what else we could do in addition to the albuterol  inhaler. I will call you  - I will also ask about some pulmonary rehabilitation to try to keep as much lung function as possible  Oxygen use: Use your finger oxygen meter to guide if you need the oxygen in the nasal canula The goal of your finger oxygen measurements are between 89 - 93%, if it falls below it, use your nasal canula.  If you run out of your albuterol  inhaler (for wheezing), please call your pharmacy   My Chart Access: [email or web address]  Please follow-up in: 3 months    We look forward to seeing you next time. Please call our clinic at [phone] if you have any questions or concerns. The best time to call is Monday-Friday from 9am-4pm, but there is someone available 24/7. If after hours or the weekend, call the main hospital number and ask for the Internal Medicine Resident On-Call. If you need medication refills, please notify your pharmacy one week in advance and they will send us  a request.   Thank you for letting us  take part in your care. Wishing you the best!  Elnora Ip, MD 02/21/2024, 10:32 AM [facility]

## 2024-02-21 NOTE — Telephone Encounter (Signed)
 ATC pt x2 unable to leave vm due to mailbox being full.  Will close encounter per office protocol.

## 2024-02-21 NOTE — Progress Notes (Signed)
 Subjective:  CC: respiratory problems  HPI:  [name] is a 78 y.o. female with a past medical history stated below and presents today for follow up on CT scans, discussion of IPF and oxygent treatment, and discussion of progression of disease. Please see problem based assessment and plan for additional details.  Past Medical History:  Diagnosis Date   Chronic back pain    mostly lower; left foot is numb all the time (11/22/2016)   Chronic kidney disease    ?renal cyst on MRI    GERD (gastroesophageal reflux disease)    Health care maintenance 12/31/2012   High cholesterol    IPF (idiopathic pulmonary fibrosis) (HCC)    Lymphadenopathy    documented on chest CT scan 12/15/2005   Substance abuse (HCC)    tobacco    Current Outpatient Medications on File Prior to Visit  Medication Sig Dispense Refill   aspirin  81 MG chewable tablet Chew 81 mg by mouth daily.     diclofenac  Sodium (VOLTAREN ) 1 % GEL Apply 4 g topically 4 (four) times daily. 4 g 3   fluticasone  (FLONASE ) 50 MCG/ACT nasal spray USE 1-2 SPRAYS IN EACH NOSTRIL DAILY 48 mL 3   gabapentin  (NEURONTIN ) 600 MG tablet Take 1 tablet (600 mg total) by mouth 4 (four) times daily. 120 tablet 3   GEMTESA  75 MG TABS Take 1 tablet by mouth daily.     MEDROL 4 MG TBPK tablet Take by mouth as directed.     metoprolol succinate (TOPROL-XL) 25 MG 24 hr tablet Take 50 mg by mouth daily.     pantoprazole  (PROTONIX ) 40 MG tablet Take 1 tablet (40 mg total) by mouth 2 (two) times daily before a meal. 90 tablet 2   rosuvastatin  (CRESTOR ) 20 MG tablet Take 1 tablet (20 mg total) by mouth daily. 90 tablet 3   Sodium Chloride-Sodium Bicarb (NETI POT SINUS WASH) 2300-700 MG KIT Place 1 kit into the nose daily. 1 kit 0   traMADol (ULTRAM) 50 MG tablet Take 50 mg by mouth 3 (three) times daily as needed.     No current facility-administered medications on file prior to visit.    Family History  Problem Relation Age of Onset    Diabetes Father    Hypertension Father     Social History   Socioeconomic History   Marital status: Divorced    Spouse name: Not on file   Number of children: 0   Years of education: Not on file   Highest education level: Not on file  Occupational History    Employer: UNEMPLOYED   Occupation: Disabled/Retired  Tobacco Use   Smoking status: Former    Current packs/day: 0.00    Average packs/day: 0.5 packs/day for 47.0 years (23.5 ttl pk-yrs)    Types: Cigarettes    Start date: 05/13/1973    Quit date: 05/13/2020    Years since quitting: 3.7   Smokeless tobacco: Never   Tobacco comments:    stopped around Thanksgiving   Vaping Use   Vaping status: Never Used  Substance and Sexual Activity   Alcohol use: Yes    Alcohol/week: 0.0 standard drinks of alcohol    Comment: 11/22/2016 2 drinks q other holiday   Drug use: Not Currently    Types: Marijuana    Comment: 66/2018 I stopped in the 1970s   Sexual activity: Yes  Other Topics Concern   Not on file  Social History Narrative   Patient's new  Phone # [phone] and [phone].   Lives alone.   Social Drivers of Corporate investment banker Strain: Low Risk  (05/02/2023)   Overall Financial Resource Strain (CARDIA)    Difficulty of Paying Living Expenses: Not hard at all  Food Insecurity: No Food Insecurity (05/02/2023)   Hunger Vital Sign    Worried About Running Out of Food in the Last Year: Never true    Ran Out of Food in the Last Year: Never true  Transportation Needs: No Transportation Needs (05/02/2023)   PRAPARE - Administrator, Civil Service (Medical): No    Lack of Transportation (Non-Medical): No  Physical Activity: Inactive (05/02/2023)   Exercise Vital Sign    Days of Exercise per Week: 0 days    Minutes of Exercise per Session: 0 min  Stress: No Stress Concern Present (05/02/2023)   [employer] of Occupational Health - Occupational Stress Questionnaire    Feeling of Stress : Not at all   Social Connections: Moderately Isolated (05/02/2023)   Social Connection and Isolation Panel    Frequency of Communication with Friends and Family: More than three times a week    Frequency of Social Gatherings with Friends and Family: More than three times a week    Attends Religious Services: More than 4 times per year    Active Member of [employer] or Organizations: No    Attends Banker Meetings: Never    Marital Status: Divorced  Catering manager Violence: Patient Unable To Answer (05/02/2023)   Humiliation, Afraid, Rape, and Kick questionnaire    Fear of Current or Ex-Partner: Patient unable to answer    Emotionally Abused: Patient unable to answer    Physically Abused: Patient unable to answer    Sexually Abused: Patient unable to answer    Review of Systems: ROS negative except for what is noted on the assessment and plan.  Objective:   Vitals:   02/21/24 0931  BP: 129/71  Pulse: 83  Temp: 97.7 F (36.5 C)  TempSrc: Oral  SpO2: 93%  Weight: 215 lb (97.5 kg)  Height: 5' 8 (1.727 m)    Physical Exam: Constitutional: well-appearing elderly woman sitting in wheeled chair, in no acute distress Eyes: conjunctiva non-erythematous Cardiovascular: regular rate and rhythm, no m/r/g, no lower extremity edema Pulmonary/Chest: normal work of breathing on room air, coarse and fine crackles throughout, accentuated on bilateral bases Abdominal: soft, non-tender, non-distended Neurological: alert & oriented x 3 Skin: warm and dry Psych: Sad mood and affect     Assessment & Plan:   IPF (idiopathic pulmonary fibrosis) (HCC) Patient presented to clinic with confusion about the rapid progression of disease, recent nightly oxygen use, and about the medications offered in consultation with pulmonology. We discussed the process of fibrosis, reviewed her 2024 and 2025 scans together, and the role of the Tki Dr. Theophilus offered during their 01/2024 visit.   At this point,  patient is frustrated with communication with [facility] and specialists. I reviewed the AVS where different doctors describe the visit discussions and next steps. Patient understands role of Tki and does not like the side effect profile. She understands the disease process and that her O2 therapy needs may increase with time.   We discussed the role of palliative care in advance care planning, goals of care, symptom management. She would like to start this process to be ready. Her mood is sad but her thought content is coherent; I will place the referral.  There  are worsening emphysematous changes in CT; last PFTs with isolated diffusion defect with normal lung volumes. She is a former smoker. I see that Dr. Theophilus has ordered PFTs to be do in the near future.  Wonder if this is a COPD-IPF overlap presentation. We discuss using albuterol  inhaler for now.   - Visit with Dr. Theophilus in December 2025 - O2 therapy at night and with Sao2 <89% - Albuterol  inhaler as needed for wheezing - Referral to palliative care - PFTs with pulmonology - Will message Dr. Theophilus to inquire about eligibility for pulm rehab    Return in about 3 months (around 05/22/2024) for Chronic condition follow up.  Patient discussed with Dr. Karna Hadassah Kristy Rosario, MD [facility] Res[facility]

## 2024-02-22 NOTE — Progress Notes (Signed)
 Internal Medicine Clinic Attending  Case discussed with the resident at the time of the visit.  We reviewed the resident's history and exam and pertinent patient test results.  I agree with the assessment, diagnosis, and plan of care documented in the resident's note.

## 2024-02-28 ENCOUNTER — Telehealth: Payer: Self-pay

## 2024-02-28 ENCOUNTER — Other Ambulatory Visit (HOSPITAL_COMMUNITY): Payer: Self-pay

## 2024-02-28 DIAGNOSIS — J849 Interstitial pulmonary disease, unspecified: Secondary | ICD-10-CM

## 2024-02-28 DIAGNOSIS — Z5181 Encounter for therapeutic drug level monitoring: Secondary | ICD-10-CM

## 2024-02-28 NOTE — Telephone Encounter (Signed)
 Received new start paperwork for Ofev . Attempted to submit a prior authorization but was notified there was already an approval on file. The case number is [id number] and is approved from 06/20/23 to 06/18/24.  Test claim shows that for 30 day supply the copay is $0.

## 2024-03-03 MED ORDER — OFEV 150 MG PO CAPS: 150.0000 mg | ORAL_CAPSULE | Freq: Two times a day (BID) | ORAL | 0 refills | Status: DC

## 2024-03-03 NOTE — Telephone Encounter (Signed)
 Patient counseled on purpose, proper use, and potential adverse effects including diarrhea, nausea, vomiting, abdominal pain, decreased appetite, weight loss, and increased blood pressure. Stressed the importance of routine lab monitoring. Will monitor LFT's every month for the first 6 months of treatment then every 3 months. Will monitor CBC every 3 months.  Ofev  dose will be 150 mg capsule every 12 hours with food. Stressed importance of taking with food to minimize stomach upset.  Scheduled lab appt for 04/04/24 for repeat LFTs.   Rx sent to [facility]. Instructed patient to call later this week to coordinate shipment to home. Provided pharmacy team phone number if patient has questions about side effects or concerns after starting therapy.   Aleck Puls, PharmD, BCPS Clinical Pharmacist  [facility]

## 2024-03-06 NOTE — Telephone Encounter (Signed)
 Received an email from Ray with [employer] that [facility] has been trying to get in touch with the patient but they have been unsuccessful.  LVM with patient to advise her to reach out to [facility] to schedule shipment.   Laura [name] phone #: [phone]

## 2024-03-07 ENCOUNTER — Telehealth (HOSPITAL_BASED_OUTPATIENT_CLINIC_OR_DEPARTMENT_OTHER): Payer: Self-pay

## 2024-03-07 MED ORDER — TRELEGY ELLIPTA 200-62.5-25 MCG/ACT IN AEPB: 1.0000 | INHALATION_SPRAY | Freq: Every day | RESPIRATORY_TRACT | 11 refills | Status: AC

## 2024-03-07 NOTE — Telephone Encounter (Signed)
 Patient has decided not to go start nintedanib. Discussed with Dr. Kristy, primary care.  Will make referral to pulmonary rehab and start inhaler therapy with Trelegy as she has combined pulmonary fibrosis and emphysema  Lonna Coder MD [facility] 03/07/2024, 12:26 PM

## 2024-03-07 NOTE — Addendum Note (Signed)
 Addended byBETHA THEOPHILUS ROOSEVELT on: 03/07/2024 12:27 PM   Modules accepted: Orders

## 2024-03-07 NOTE — Telephone Encounter (Signed)
 Copied from CRM #[id number]. Topic: Medical Record Request - Records Request >> Mar 05, 2024 11:18 AM Shona RAMAN wrote: Reason for CRM: patient is requesting her medicsl records be mailed to her.

## 2024-03-10 ENCOUNTER — Telehealth (HOSPITAL_COMMUNITY): Payer: Self-pay

## 2024-03-10 NOTE — Telephone Encounter (Deleted)
 Called patient to see if she was interested in participating in the Pulmonary Rehab Program. Patient will come in for orientation on 9/26@1030  and will attend the 1015 exercise class.   Sent packet.

## 2024-03-10 NOTE — Telephone Encounter (Signed)
 Pt insurance is active and benefits verified through [facility] Medicare Dual Co-pay 0, DED 0/0 met, out of pocket $9,350/$940.21 met, co-insurance 20%. no pre-authorization required, [employer] 03/10/2024@2 :57, REF# [id number]

## 2024-03-10 NOTE — Telephone Encounter (Signed)
 Called patient to see if she was interested in participating in the Pulmonary Rehab Program. Patient will come in for orientation on 9/26@1030  and will attend the 1015 exercise class.   Sent packet.

## 2024-03-10 NOTE — Telephone Encounter (Signed)
 ,

## 2024-03-13 ENCOUNTER — Telehealth (HOSPITAL_COMMUNITY): Payer: Self-pay

## 2024-03-13 NOTE — Telephone Encounter (Signed)
 Called to confirm appt. Pt confirmed appt. Instructed pt on proper footwear. Gave directions along with department number.

## 2024-03-14 ENCOUNTER — Encounter (HOSPITAL_COMMUNITY)
Admission: RE | Admit: 2024-03-14 | Discharge: 2024-03-14 | Disposition: A | Discharge status: Home / Self Care | Source: Ambulatory Visit | Attending: Pulmonary Disease | Admitting: Pulmonary Disease

## 2024-03-14 ENCOUNTER — Encounter (HOSPITAL_COMMUNITY): Payer: Self-pay

## 2024-03-14 VITALS — BP 112/60 | HR 100 | Wt 217.4 lb

## 2024-03-14 DIAGNOSIS — J849 Interstitial pulmonary disease, unspecified: Secondary | ICD-10-CM | POA: Diagnosis present

## 2024-03-14 NOTE — Progress Notes (Signed)
 Pulmonary Individual Treatment Plan  Patient Details  Name: Laura [name] MRN: [id number] Date of Birth: 07-22-1945 Referring Provider:   Conrad Ports Pulmonary Rehab Walk Test from 03/14/2024 in [facility]  Referring Provider Dr. Theophilus    Initial Encounter Date:  Flowsheet Row Pulmonary Rehab Walk Test from 03/14/2024 in [facility]  Date 03/14/24    Visit Diagnosis: ILD (interstitial lung disease) (HCC)  Patient's Home Medications on Admission:   Current Outpatient Medications:    albuterol  (VENTOLIN  HFA) 108 (90 Base) MCG/ACT inhaler, Inhale 2 puffs into the lungs every 4 (four) hours as needed for wheezing or shortness of breath., Disp: 8.5 each, Rfl: 2   aspirin  81 MG chewable tablet, Chew 81 mg by mouth daily., Disp: , Rfl:    fluticasone  (FLONASE ) 50 MCG/ACT nasal spray, USE 1-2 SPRAYS IN EACH NOSTRIL DAILY, Disp: 48 mL, Rfl: 3   Fluticasone -Umeclidin-Vilant (TRELEGY ELLIPTA ) 200-62.5-25 MCG/ACT AEPB, Inhale 1 puff into the lungs daily., Disp: 60 each, Rfl: 11   gabapentin  (NEURONTIN ) 600 MG tablet, Take 1 tablet (600 mg total) by mouth 4 (four) times daily., Disp: 120 tablet, Rfl: 3   GEMTESA  75 MG TABS, Take 1 tablet by mouth daily., Disp: , Rfl:    metoprolol succinate (TOPROL-XL) 25 MG 24 hr tablet, Take 50 mg by mouth daily., Disp: , Rfl:    pantoprazole  (PROTONIX ) 40 MG tablet, Take 1 tablet (40 mg total) by mouth 2 (two) times daily before a meal., Disp: 90 tablet, Rfl: 2   rosuvastatin  (CRESTOR ) 20 MG tablet, Take 1 tablet (20 mg total) by mouth daily., Disp: 90 tablet, Rfl: 3   diclofenac  Sodium (VOLTAREN ) 1 % GEL, Apply 4 g topically 4 (four) times daily., Disp: 4 g, Rfl: 3   MEDROL 4 MG TBPK tablet, Take by mouth as directed., Disp: , Rfl:    Nintedanib (OFEV ) 150 MG CAPS, Take 1 capsule (150 mg total) by mouth 2 (two) times daily. (Patient not  taking: Reported on 03/14/2024), Disp: 180 capsule, Rfl: 0   Sodium Chloride-Sodium Bicarb (NETI POT SINUS WASH) 2300-700 MG KIT, Place 1 kit into the nose daily., Disp: 1 kit, Rfl: 0   traMADol (ULTRAM) 50 MG tablet, Take 50 mg by mouth 3 (three) times daily as needed., Disp: , Rfl:   Past Medical History: Past Medical History:  Diagnosis Date   Chronic back pain    mostly lower; left foot is numb all the time (11/22/2016)   Chronic kidney disease    ?renal cyst on MRI    GERD (gastroesophageal reflux disease)    Health care maintenance 12/31/2012   High cholesterol    IPF (idiopathic pulmonary fibrosis) (HCC)    Lymphadenopathy    documented on chest CT scan 12/15/2005   Substance abuse (HCC)    tobacco    Tobacco Use: Social History   Tobacco Use  Smoking Status Former   Current packs/day: 0.00   Average packs/day: 0.5 packs/day for 47.0 years (23.5 ttl pk-yrs)   Types: Cigarettes   Start date: 05/13/1973   Quit date: 05/13/2020   Years since quitting: 3.8  Smokeless Tobacco Never  Tobacco Comments   stopped around Thanksgiving     Labs: Review Flowsheet  More data exists      Latest Ref Rng & Units 04/05/2021 10/12/2021 05/04/2022 05/04/2023 08/15/2023  Labs for ITP Cardiac and Pulmonary Rehab  Cholestrol 100 -  199 mg/dL - 893  883  868  -  LDL (calc) 0 - 99 mg/dL - 63  66  85  -  HDL-C >39 mg/dL - 28  34  33  -  Trlycerides 0 - 149 mg/dL - 72  77  59  -  Hemoglobin A1c 4.8 - 5.6 % 5.6  - 5.8  5.7  5.9     Capillary Blood Glucose: Lab Results  Component Value Date   GLUCAP 95 12/25/2023   GLUCAP 104 (H) 05/04/2023   GLUCAP 105 (H) 04/05/2021   GLUCAP 77 12/08/2019   GLUCAP 117 (H) 05/02/2017     Pulmonary Assessment Scores:  Pulmonary Assessment Scores     Row Name 03/14/24 1033         ADL UCSD   ADL Phase Entry     SOB Score total 92       CAT Score   CAT Score 31       mMRC Score   mMRC Score 4       UCSD: Self-administered rating of  dyspnea associated with activities of daily living (ADLs) 6-point scale (0 = not at all to 5 = maximal or unable to do because of breathlessness)  Scoring Scores range from 0 to 120.  Minimally important difference is 5 units  CAT: CAT can identify the health impairment of COPD patients and is better correlated with disease progression.  CAT has a scoring range of zero to 40. The CAT score is classified into four groups of low (less than 10), medium (10 - 20), high (21-30) and very high (31-40) based on the impact level of disease on health status. A CAT score over 10 suggests significant symptoms.  A worsening CAT score could be explained by an exacerbation, poor medication adherence, poor inhaler technique, or progression of COPD or comorbid conditions.  CAT MCID is 2 points  mMRC: mMRC (Modified Medical Research Council) Dyspnea Scale is used to assess the degree of baseline functional disability in patients of respiratory disease due to dyspnea. No minimal important difference is established. A decrease in score of 1 point or greater is considered a positive change.   Pulmonary Function Assessment:  Pulmonary Function Assessment - 03/14/24 1034       Breath   Bilateral Breath Sounds Rales    Shortness of Breath Yes;Limiting activity          Exercise Target Goals: Exercise Program Goal: Individual exercise prescription set using results from initial 6 min walk test and THRR while considering  patient's activity barriers and safety.   Exercise Prescription Goal: Initial exercise prescription builds to 30-45 minutes a day of aerobic activity, 2-3 days per week.  Home exercise guidelines will be given to patient during program as part of exercise prescription that the participant will acknowledge.  Activity Barriers & Risk Stratification:  Activity Barriers & Cardiac Risk Stratification - 03/14/24 1051       Activity Barriers & Cardiac Risk Stratification   Activity Barriers  Deconditioning;Muscular Weakness;Shortness of Breath;Back Problems;Assistive Device    Cardiac Risk Stratification Moderate          6 Minute Walk:  6 Minute Walk     Row Name 03/14/24 1303         6 Minute Walk   Phase Initial     Distance 660 feet     Walk Time 6 minutes     # of Rest Breaks 1  2:55-3:47  MPH 1.25     METS 1.32     RPE 11     Perceived Dyspnea  2     VO2 Peak 4.64     Symptoms Yes (comment)     Comments back and hip pain     Resting HR 100 bpm     Resting BP 112/60     Resting Oxygen  Saturation  93 %     Exercise Oxygen  Saturation  during 6 min walk 85 %     Max Ex. HR 112 bpm     Max Ex. BP 132/60     2 Minute Post BP 122/60       Interval HR   1 Minute HR 100     2 Minute HR 104     3 Minute HR 102     4 Minute HR 101     5 Minute HR 100     6 Minute HR 112     2 Minute Post HR 86     Interval Heart Rate? Yes       Interval Oxygen    Interval Oxygen ? Yes     Baseline Oxygen  Saturation % 93 %     1 Minute Oxygen  Saturation % 95 %     1 Minute Liters of Oxygen  3 L     2 Minute Oxygen  Saturation % 90 %     2 Minute Liters of Oxygen  3 L     3 Minute Oxygen  Saturation % 85 %     3 Minute Liters of Oxygen  3 L  increased to 4L     4 Minute Oxygen  Saturation % 90 %     4 Minute Liters of Oxygen  4 L     5 Minute Oxygen  Saturation % 90 %     5 Minute Liters of Oxygen  4 L     6 Minute Oxygen  Saturation % 87 %     6 Minute Liters of Oxygen  4 L  increased to 6L     2 Minute Post Oxygen  Saturation % 96 %     2 Minute Post Liters of Oxygen  6 L        Oxygen  Initial Assessment:  Oxygen  Initial Assessment - 03/14/24 1052       Home Oxygen    Home Oxygen  Device Home Concentrator;E-Tanks    Sleep Oxygen  Prescription Continuous    Liters per minute 3    Home Exercise Oxygen  Prescription Continuous    Liters per minute 3    Home Resting Oxygen  Prescription Continuous    Liters per minute 3    Compliance with Home Oxygen  Use Yes       Initial 6 min Walk   Oxygen  Used Continuous    Liters per minute 6      Program Oxygen  Prescription   Program Oxygen  Prescription Continuous    Liters per minute 6      Intervention   Short Term Goals To learn and exhibit compliance with exercise, home and travel O2 prescription;To learn and understand importance of monitoring SPO2 with pulse oximeter and demonstrate accurate use of the pulse oximeter.;To learn and understand importance of maintaining oxygen  saturations>88%;To learn and demonstrate proper pursed lip breathing techniques or other breathing techniques. ;To learn and demonstrate proper use of respiratory medications    Long  Term Goals Exhibits compliance with exercise, home  and travel O2 prescription;Maintenance of O2 saturations>88%;Compliance with respiratory medication;Verbalizes importance of monitoring SPO2 with pulse oximeter and return demonstration;Exhibits proper  breathing techniques, such as pursed lip breathing or other method taught during program session;Demonstrates proper use of MDI's          Oxygen  Re-Evaluation:   Oxygen  Discharge (Final Oxygen  Re-Evaluation):   Initial Exercise Prescription:  Initial Exercise Prescription - 03/14/24 1300       Date of Initial Exercise RX and Referring Provider   Date 03/14/24    Referring Provider Dr. Theophilus    Expected Discharge Date 06/10/24      Oxygen    Oxygen  Continuous    Liters 3-6    Maintain Oxygen  Saturation 88% or higher      NuStep   Level 1    SPM 60    Minutes 15    METs 1.5      Track   Laps 10    Minutes 15    METs 1.8      Prescription Details   Frequency (times per week) 2    Duration Progress to 30 minutes of continuous aerobic without signs/symptoms of physical distress      Intensity   THRR 40-80% of Max Heartrate 58-116    Ratings of Perceived Exertion 11-13    Perceived Dyspnea 0-4      Progression   Progression Continue to progress workloads to maintain intensity  without signs/symptoms of physical distress.      Resistance Training   Training Prescription No          Perform Capillary Blood Glucose checks as needed.  Exercise Prescription Changes:   Exercise Comments:   Exercise Goals and Review:   Exercise Goals     Row Name 03/14/24 1018             Exercise Goals   Increase Physical Activity Yes       Intervention Provide advice, education, support and counseling about physical activity/exercise needs.;Develop an individualized exercise prescription for aerobic and resistive training based on initial evaluation findings, risk stratification, comorbidities and participant's personal goals.       Expected Outcomes Short Term: Attend rehab on a regular basis to increase amount of physical activity.;Long Term: Add in home exercise to make exercise part of routine and to increase amount of physical activity.;Long Term: Exercising regularly at least 3-5 days a week.       Increase Strength and Stamina Yes       Intervention Provide advice, education, support and counseling about physical activity/exercise needs.;Develop an individualized exercise prescription for aerobic and resistive training based on initial evaluation findings, risk stratification, comorbidities and participant's personal goals.       Expected Outcomes Short Term: Increase workloads from initial exercise prescription for resistance, speed, and METs.;Short Term: Perform resistance training exercises routinely during rehab and add in resistance training at home;Long Term: Improve cardiorespiratory fitness, muscular endurance and strength as measured by increased METs and functional capacity ( )       Able to understand and use rate of perceived exertion (RPE) scale Yes       Intervention Provide education and explanation on how to use RPE scale       Expected Outcomes Short Term: Able to use RPE daily in rehab to express subjective intensity level;Long Term:  Able to use  RPE to guide intensity level when exercising independently       Able to understand and use Dyspnea scale Yes       Intervention Provide education and explanation on how to use Dyspnea scale  Expected Outcomes Short Term: Able to use Dyspnea scale daily in rehab to express subjective sense of shortness of breath during exertion;Long Term: Able to use Dyspnea scale to guide intensity level when exercising independently       Knowledge and understanding of Target Heart Rate Range (THRR) Yes       Intervention Provide education and explanation of THRR including how the numbers were predicted and where they are located for reference       Expected Outcomes Short Term: Able to state/look up THRR;Long Term: Able to use THRR to govern intensity when exercising independently;Short Term: Able to use daily as guideline for intensity in rehab       Understanding of Exercise Prescription Yes       Intervention Provide education, explanation, and written materials on patient's individual exercise prescription       Expected Outcomes Short Term: Able to explain program exercise prescription;Long Term: Able to explain home exercise prescription to exercise independently          Exercise Goals Re-Evaluation :   Discharge Exercise Prescription (Final Exercise Prescription Changes):   Nutrition:  Target Goals: Understanding of nutrition guidelines, daily intake of sodium 1500mg , cholesterol 200mg , calories 30% from fat and 7% or less from saturated fats, daily to have 5 or more servings of fruits and vegetables.  Biometrics:  Pre Biometrics - 03/14/24 1306       Pre Biometrics   Grip Strength 18 kg           Nutrition Therapy Plan and Nutrition Goals:   Nutrition Assessments:  MEDIFICTS Score Key: >=70 Need to make dietary changes  40-70 Heart Healthy Diet <= 40 Therapeutic Level Cholesterol Diet   Picture Your Plate Scores: <59 Unhealthy dietary pattern with much room for  improvement. 41-50 Dietary pattern unlikely to meet recommendations for good health and room for improvement. 51-60 More healthful dietary pattern, with some room for improvement.  >60 Healthy dietary pattern, although there may be some specific behaviors that could be improved.    Nutrition Goals Re-Evaluation:   Nutrition Goals Discharge (Final Nutrition Goals Re-Evaluation):   Psychosocial: Target Goals: Acknowledge presence or absence of significant depression and/or stress, maximize coping skills, provide positive support system. Participant is able to verbalize types and ability to use techniques and skills needed for reducing stress and depression.  Initial Review & Psychosocial Screening:  Initial Psych Review & Screening - 03/14/24 1050       Initial Review   Current issues with None Identified      Family Dynamics   Good Support System? Yes      Barriers   Psychosocial barriers to participate in program There are no identifiable barriers or psychosocial needs.      Screening Interventions   Interventions Encouraged to exercise    Expected Outcomes Short Term goal: Identification and review with participant of any Quality of Life or Depression concerns found by scoring the questionnaire.;Long Term goal: The participant improves quality of Life and PHQ9 Scores as seen by post scores and/or verbalization of changes          Quality of Life Scores:  Scores of 19 and below usually indicate a poorer quality of life in these areas.  A difference of  2-3 points is a clinically meaningful difference.  A difference of 2-3 points in the total score of the Quality of Life Index has been associated with significant improvement in overall quality of life, self-image, physical symptoms, and  general health in studies assessing change in quality of life.  PHQ-9: Review Flowsheet  More data exists      03/14/2024 12/17/2023 10/04/2023 08/15/2023 05/02/2023  Depression screen PHQ 2/9   Decreased Interest 0 0 0 0 0  Down, Depressed, Hopeless 0 0 0 0 0  PHQ - 2 Score 0 0 0 0 0  Altered sleeping 0 0 - 0 0  Tired, decreased energy 1 3 - 0 0  Change in appetite 0 0 - 0 0  Feeling bad or failure about yourself  0 0 - 0 0  Trouble concentrating 0 0 - 0 0  Moving slowly or fidgety/restless 0 0 - 0 0  Suicidal thoughts 0 - - 0 0  PHQ-9 Score 1 3 - 0 0  Difficult doing work/chores Somewhat difficult Not difficult at all - Not difficult at all Not difficult at all   Interpretation of Total Score  Total Score Depression Severity:  1-4 = Minimal depression, 5-9 = Mild depression, 10-14 = Moderate depression, 15-19 = Moderately severe depression, 20-27 = Severe depression   Psychosocial Evaluation and Intervention:  Psychosocial Evaluation - 03/14/24 1302       Psychosocial Evaluation & Interventions   Interventions Encouraged to exercise with the program and follow exercise prescription    Comments Shan denies any psychosocial barriers or concerns at this time.    Expected Outcomes For Quanesha to participate in PR free of any psychosocial barriers or concerns    Continue Psychosocial Services  No Follow up required          Psychosocial Re-Evaluation:   Psychosocial Discharge (Final Psychosocial Re-Evaluation):   Education: Education Goals: Education classes will be provided on a weekly basis, covering required topics. Participant will state understanding/return demonstration of topics presented.  Learning Barriers/Preferences:  Learning Barriers/Preferences - 03/14/24 1050       Learning Barriers/Preferences   Learning Barriers Sight   wears glasses   Learning Preferences None          Education Topics: Know Your Numbers Group instruction that is supported by a PowerPoint presentation. Instructor discusses importance of knowing and understanding resting, exercise, and post-exercise oxygen  saturation, heart rate, and blood pressure. Oxygen  saturation,  heart rate, blood pressure, rating of perceived exertion, and dyspnea are reviewed along with a normal range for these values.    Exercise for the Pulmonary Patient Group instruction that is supported by a PowerPoint presentation. Instructor discusses benefits of exercise, core components of exercise, frequency, duration, and intensity of an exercise routine, importance of utilizing pulse oximetry during exercise, safety while exercising, and options of places to exercise outside of rehab.    MET Level  Group instruction provided by PowerPoint, verbal discussion, and written material to support subject matter. Instructor reviews what METs are and how to increase METs.    Pulmonary Medications Verbally interactive group education provided by instructor with focus on inhaled medications and proper administration.   Anatomy and Physiology of the Respiratory System Group instruction provided by PowerPoint, verbal discussion, and written material to support subject matter. Instructor reviews respiratory cycle and anatomical components of the respiratory system and their functions. Instructor also reviews differences in obstructive and restrictive respiratory diseases with examples of each.    Oxygen  Safety Group instruction provided by PowerPoint, verbal discussion, and written material to support subject matter. There is an overview of "What is Oxygen " and "Why do we need it".  Instructor also reviews how to create a safe environment for  oxygen  use, the importance of using oxygen  as prescribed, and the risks of noncompliance. There is a brief discussion on traveling with oxygen  and resources the patient may utilize.   Oxygen  Use Group instruction provided by PowerPoint, verbal discussion, and written material to discuss how supplemental oxygen  is prescribed and different types of oxygen  supply systems. Resources for more information are provided.    Breathing Techniques Group instruction that  is supported by demonstration and informational handouts. Instructor discusses the benefits of pursed lip and diaphragmatic breathing and detailed demonstration on how to perform both.     Risk Factor Reduction Group instruction that is supported by a PowerPoint presentation. Instructor discusses the definition of a risk factor, different risk factors for pulmonary disease, and how the heart and lungs work together.   Pulmonary Diseases Group instruction provided by PowerPoint, verbal discussion, and written material to support subject matter. Instructor gives an overview of the different type of pulmonary diseases. There is also a discussion on risk factors and symptoms as well as ways to manage the diseases.   Stress and Energy Conservation Group instruction provided by PowerPoint, verbal discussion, and written material to support subject matter. Instructor gives an overview of stress and the impact it can have on the body. Instructor also reviews ways to reduce stress. There is also a discussion on energy conservation and ways to conserve energy throughout the day.   Warning Signs and Symptoms Group instruction provided by PowerPoint, verbal discussion, and written material to support subject matter. Instructor reviews warning signs and symptoms of stroke, heart attack, cold and flu. Instructor also reviews ways to prevent the spread of infection.   Other Education Group or individual verbal, written, or video instructions that support the educational goals of the pulmonary rehab program.    Knowledge Questionnaire Score:  Knowledge Questionnaire Score - 03/14/24 1031       Knowledge Questionnaire Score   Pre Score 15/18          Core Components/Risk Factors/Patient Goals at Admission:  Personal Goals and Risk Factors at Admission - 03/14/24 1051       Core Components/Risk Factors/Patient Goals on Admission   Improve shortness of breath with ADL's Yes    Intervention  Provide education, individualized exercise plan and daily activity instruction to help decrease symptoms of SOB with activities of daily living.    Expected Outcomes Short Term: Improve cardiorespiratory fitness to achieve a reduction of symptoms when performing ADLs;Long Term: Be able to perform more ADLs without symptoms or delay the onset of symptoms    Increase knowledge of respiratory medications and ability to use respiratory devices properly  Yes    Intervention Provide education and demonstration as needed of appropriate use of medications, inhalers, and oxygen  therapy.    Expected Outcomes Short Term: Achieves understanding of medications use. Understands that oxygen  is a medication prescribed by physician. Demonstrates appropriate use of inhaler and oxygen  therapy.;Long Term: Maintain appropriate use of medications, inhalers, and oxygen  therapy.          Core Components/Risk Factors/Patient Goals Review:    Core Components/Risk Factors/Patient Goals at Discharge (Final Review):    ITP Comments:   Comments: Dr. Slater Staff is Medical Director for Pulmonary Rehab at [facility].

## 2024-03-14 NOTE — Progress Notes (Signed)
   03/14/24 1303  6 Minute Walk  Phase Initial  Distance 660 feet  Walk Time 6 minutes  # of Rest Breaks 1 (2:55-3:47)  MPH 1.25  METS 1.32  RPE 11  Perceived Dyspnea  2  VO2 Peak 4.64  Symptoms Yes (comment)  Comments back and hip pain  Resting HR 100 bpm  Resting BP 112/60  Resting Oxygen  Saturation  93 %  Exercise Oxygen  Saturation  during 6 min walk 85 %  Max Ex. HR 112 bpm  Max Ex. BP 132/60  2 Minute Post BP 122/60  Interval HR  1 Minute HR 100  2 Minute HR 104  3 Minute HR 102  4 Minute HR 101  5 Minute HR 100  6 Minute HR 112  2 Minute Post HR 86  Interval Heart Rate? Yes  Interval Oxygen   Interval Oxygen ? Yes  Baseline Oxygen  Saturation % 93 %  1 Minute Oxygen  Saturation % 95 %  1 Minute Liters of Oxygen  3 L  2 Minute Oxygen  Saturation % 90 %  2 Minute Liters of Oxygen  3 L  3 Minute Oxygen  Saturation % 85 %  3 Minute Liters of Oxygen  3 L (increased to 4L)  4 Minute Oxygen  Saturation % 90 %  4 Minute Liters of Oxygen  4 L  5 Minute Oxygen  Saturation % 90 %  5 Minute Liters of Oxygen  4 L  6 Minute Oxygen  Saturation % 87 %  6 Minute Liters of Oxygen  4 L (increased to 6L)  2 Minute Post Oxygen  Saturation % 96 %  2 Minute Post Liters of Oxygen  6 L

## 2024-03-14 NOTE — Progress Notes (Signed)
 Laura [name] Laura [name] 78 y.o. female Pulmonary Rehab Orientation Note This patient who was referred to Pulmonary Rehab by Dr. Theophilus with the diagnosis of ILD arrived today in Cardiac and Pulmonary Rehab. She  arrived ambulatory with assistive device with normal gait. She  does carry portable oxygen . [employer] is the provider for their DME. Per patient, [name] uses oxygen  while sleeping and with activity. Color good, skin warm and dry. Patient is oriented to time and place. Patient's medical history, psychosocial health, and medications reviewed. Psychosocial assessment reveals patient lives with alone. [name] is currently unemployed, disabled and retired. Patient hobbies include watching tv and spending time with others. Patient reports her stress level is low. Areas of stress/anxiety include health. Patient does not exhibit signs of depression. PHQ2/9 score 0/1. [name] shows good  coping skills with positive outlook on life. Offered emotional support and reassurance. Will continue to monitor. Physical assessment performed by Nurse pick: Ronal Levin RN. Please see their orientation physical assessment note. [name] reports she does take medications as prescribed. Patient states she follows a regular  diet. The patient reports no specific efforts to gain or lose weight.. Patient's weight will be monitored closely. Demonstration and practice of PLB using pulse oximeter. Memory able to return demonstration satisfactorily. Safety and hand hygiene in the exercise area reviewed with patient. [name] voices understanding of the information reviewed. Department expectations discussed with patient and achievable goals were set. The patient shows enthusiasm about attending the program and we look forward to working with Laura [name]. [name] completed a 6 min walk test today and is scheduled to begin exercise on 03/20/24.   [id number] Augustin KATHEE Sharps, BSRT

## 2024-03-19 ENCOUNTER — Other Ambulatory Visit: Payer: Self-pay

## 2024-03-19 DIAGNOSIS — M961 Postlaminectomy syndrome, not elsewhere classified: Secondary | ICD-10-CM

## 2024-03-19 NOTE — Progress Notes (Signed)
 Pulmonary Individual Treatment Plan  Patient Details  Name: XENIA NILE MRN: [id number] Date of Birth: June 13, 1946 Referring Provider:   Conrad Ports Pulmonary Rehab Walk Test from 03/14/2024 in [facility]  Referring Provider Dr. Theophilus    Initial Encounter Date:  Flowsheet Row Pulmonary Rehab Walk Test from 03/14/2024 in [facility]  Date 03/14/24    Visit Diagnosis: ILD (interstitial lung disease) (HCC)  Patient's Home Medications on Admission:   Current Outpatient Medications:    albuterol  (VENTOLIN  HFA) 108 (90 Base) MCG/ACT inhaler, Inhale 2 puffs into the lungs every 4 (four) hours as needed for wheezing or shortness of breath., Disp: 8.5 each, Rfl: 2   aspirin  81 MG chewable tablet, Chew 81 mg by mouth daily., Disp: , Rfl:    fluticasone  (FLONASE ) 50 MCG/ACT nasal spray, USE 1-2 SPRAYS IN EACH NOSTRIL DAILY, Disp: 48 mL, Rfl: 3   Fluticasone -Umeclidin-Vilant (TRELEGY ELLIPTA ) 200-62.5-25 MCG/ACT AEPB, Inhale 1 puff into the lungs daily., Disp: 60 each, Rfl: 11   gabapentin  (NEURONTIN ) 600 MG tablet, Take 1 tablet (600 mg total) by mouth 4 (four) times daily., Disp: 120 tablet, Rfl: 3   GEMTESA  75 MG TABS, Take 1 tablet by mouth daily., Disp: , Rfl:    metoprolol succinate (TOPROL-XL) 25 MG 24 hr tablet, Take 50 mg by mouth daily., Disp: , Rfl:    pantoprazole  (PROTONIX ) 40 MG tablet, Take 1 tablet (40 mg total) by mouth 2 (two) times daily before a meal., Disp: 90 tablet, Rfl: 2   rosuvastatin  (CRESTOR ) 20 MG tablet, Take 1 tablet (20 mg total) by mouth daily., Disp: 90 tablet, Rfl: 3   diclofenac  Sodium (VOLTAREN ) 1 % GEL, Apply 4 g topically 4 (four) times daily., Disp: 4 g, Rfl: 3   MEDROL 4 MG TBPK tablet, Take by mouth as directed., Disp: , Rfl:    Nintedanib (OFEV ) 150 MG CAPS, Take 1 capsule (150 mg total) by mouth 2 (two) times daily. (Patient not  taking: Reported on 03/14/2024), Disp: 180 capsule, Rfl: 0   Sodium Chloride-Sodium Bicarb (NETI POT SINUS WASH) 2300-700 MG KIT, Place 1 kit into the nose daily., Disp: 1 kit, Rfl: 0   traMADol (ULTRAM) 50 MG tablet, Take 50 mg by mouth 3 (three) times daily as needed., Disp: , Rfl:   Past Medical History: Past Medical History:  Diagnosis Date   Chronic back pain    mostly lower; left foot is numb all the time (11/22/2016)   Chronic kidney disease    ?renal cyst on MRI    GERD (gastroesophageal reflux disease)    Health care maintenance 12/31/2012   High cholesterol    IPF (idiopathic pulmonary fibrosis) (HCC)    Lymphadenopathy    documented on chest CT scan 12/15/2005   Substance abuse (HCC)    tobacco    Tobacco Use: Social History   Tobacco Use  Smoking Status Former   Current packs/day: 0.00   Average packs/day: 0.5 packs/day for 47.0 years (23.5 ttl pk-yrs)   Types: Cigarettes   Start date: 05/13/1973   Quit date: 05/13/2020   Years since quitting: 3.8  Smokeless Tobacco Never  Tobacco Comments   stopped around Thanksgiving     Labs: Review Flowsheet  More data exists      Latest Ref Rng & Units 04/05/2021 10/12/2021 05/04/2022 05/04/2023 08/15/2023  Labs for ITP Cardiac and Pulmonary Rehab  Cholestrol 100 -  199 mg/dL - 893  883  868  -  LDL (calc) 0 - 99 mg/dL - 63  66  85  -  HDL-C >39 mg/dL - 28  34  33  -  Trlycerides 0 - 149 mg/dL - 72  77  59  -  Hemoglobin A1c 4.8 - 5.6 % 5.6  - 5.8  5.7  5.9     Capillary Blood Glucose: Lab Results  Component Value Date   GLUCAP 95 12/25/2023   GLUCAP 104 (H) 05/04/2023   GLUCAP 105 (H) 04/05/2021   GLUCAP 77 12/08/2019   GLUCAP 117 (H) 05/02/2017     Pulmonary Assessment Scores:  Pulmonary Assessment Scores     Row Name 03/14/24 1033         ADL UCSD   ADL Phase Entry     SOB Score total 92       CAT Score   CAT Score 31       mMRC Score   mMRC Score 4       UCSD: Self-administered rating of  dyspnea associated with activities of daily living (ADLs) 6-point scale (0 = not at all to 5 = maximal or unable to do because of breathlessness)  Scoring Scores range from 0 to 120.  Minimally important difference is 5 units  CAT: CAT can identify the health impairment of COPD patients and is better correlated with disease progression.  CAT has a scoring range of zero to 40. The CAT score is classified into four groups of low (less than 10), medium (10 - 20), high (21-30) and very high (31-40) based on the impact level of disease on health status. A CAT score over 10 suggests significant symptoms.  A worsening CAT score could be explained by an exacerbation, poor medication adherence, poor inhaler technique, or progression of COPD or comorbid conditions.  CAT MCID is 2 points  mMRC: mMRC (Modified Medical Research Council) Dyspnea Scale is used to assess the degree of baseline functional disability in patients of respiratory disease due to dyspnea. No minimal important difference is established. A decrease in score of 1 point or greater is considered a positive change.   Pulmonary Function Assessment:  Pulmonary Function Assessment - 03/14/24 1034       Breath   Bilateral Breath Sounds Rales    Shortness of Breath Yes;Limiting activity          Exercise Target Goals: Exercise Program Goal: Individual exercise prescription set using results from initial 6 min walk test and THRR while considering  patient's activity barriers and safety.   Exercise Prescription Goal: Initial exercise prescription builds to 30-45 minutes a day of aerobic activity, 2-3 days per week.  Home exercise guidelines will be given to patient during program as part of exercise prescription that the participant will acknowledge.  Activity Barriers & Risk Stratification:  Activity Barriers & Cardiac Risk Stratification - 03/14/24 1051       Activity Barriers & Cardiac Risk Stratification   Activity Barriers  Deconditioning;Muscular Weakness;Shortness of Breath;Back Problems;Assistive Device    Cardiac Risk Stratification Moderate          6 Minute Walk:  6 Minute Walk     Row Name 03/14/24 1303         6 Minute Walk   Phase Initial     Distance 660 feet     Walk Time 6 minutes     # of Rest Breaks 1  2:55-3:47  MPH 1.25     METS 1.32     RPE 11     Perceived Dyspnea  2     VO2 Peak 4.64     Symptoms Yes (comment)     Comments back and hip pain     Resting HR 100 bpm     Resting BP 112/60     Resting Oxygen  Saturation  93 %     Exercise Oxygen  Saturation  during 6 min walk 85 %     Max Ex. HR 112 bpm     Max Ex. BP 132/60     2 Minute Post BP 122/60       Interval HR   1 Minute HR 100     2 Minute HR 104     3 Minute HR 102     4 Minute HR 101     5 Minute HR 100     6 Minute HR 112     2 Minute Post HR 86     Interval Heart Rate? Yes       Interval Oxygen    Interval Oxygen ? Yes     Baseline Oxygen  Saturation % 93 %     1 Minute Oxygen  Saturation % 95 %     1 Minute Liters of Oxygen  3 L     2 Minute Oxygen  Saturation % 90 %     2 Minute Liters of Oxygen  3 L     3 Minute Oxygen  Saturation % 85 %     3 Minute Liters of Oxygen  3 L  increased to 4L     4 Minute Oxygen  Saturation % 90 %     4 Minute Liters of Oxygen  4 L     5 Minute Oxygen  Saturation % 90 %     5 Minute Liters of Oxygen  4 L     6 Minute Oxygen  Saturation % 87 %     6 Minute Liters of Oxygen  4 L  increased to 6L     2 Minute Post Oxygen  Saturation % 96 %     2 Minute Post Liters of Oxygen  6 L        Oxygen  Initial Assessment:  Oxygen  Initial Assessment - 03/14/24 1052       Home Oxygen    Home Oxygen  Device Home Concentrator;E-Tanks    Sleep Oxygen  Prescription Continuous    Liters per minute 3    Home Exercise Oxygen  Prescription Continuous    Liters per minute 3    Home Resting Oxygen  Prescription Continuous    Liters per minute 3    Compliance with Home Oxygen  Use Yes       Initial 6 min Walk   Oxygen  Used Continuous    Liters per minute 6      Program Oxygen  Prescription   Program Oxygen  Prescription Continuous    Liters per minute 6      Intervention   Short Term Goals To learn and exhibit compliance with exercise, home and travel O2 prescription;To learn and understand importance of monitoring SPO2 with pulse oximeter and demonstrate accurate use of the pulse oximeter.;To learn and understand importance of maintaining oxygen  saturations>88%;To learn and demonstrate proper pursed lip breathing techniques or other breathing techniques. ;To learn and demonstrate proper use of respiratory medications    Long  Term Goals Exhibits compliance with exercise, home  and travel O2 prescription;Maintenance of O2 saturations>88%;Compliance with respiratory medication;Verbalizes importance of monitoring SPO2 with pulse oximeter and return demonstration;Exhibits proper  breathing techniques, such as pursed lip breathing or other method taught during program session;Demonstrates proper use of MDI's          Oxygen  Re-Evaluation:  Oxygen  Re-Evaluation     Row Name 03/17/24 0859             Program Oxygen  Prescription   Program Oxygen  Prescription Continuous       Liters per minute 6         Home Oxygen    Home Oxygen  Device Home Concentrator;E-Tanks       Sleep Oxygen  Prescription Continuous       Liters per minute 3       Home Exercise Oxygen  Prescription Continuous       Liters per minute 3       Home Resting Oxygen  Prescription Continuous       Liters per minute 3       Compliance with Home Oxygen  Use Yes         Goals/Expected Outcomes   Short Term Goals To learn and exhibit compliance with exercise, home and travel O2 prescription;To learn and understand importance of monitoring SPO2 with pulse oximeter and demonstrate accurate use of the pulse oximeter.;To learn and understand importance of maintaining oxygen  saturations>88%;To learn and demonstrate proper  pursed lip breathing techniques or other breathing techniques. ;To learn and demonstrate proper use of respiratory medications       Long  Term Goals Exhibits compliance with exercise, home  and travel O2 prescription;Maintenance of O2 saturations>88%;Compliance with respiratory medication;Verbalizes importance of monitoring SPO2 with pulse oximeter and return demonstration;Exhibits proper breathing techniques, such as pursed lip breathing or other method taught during program session;Demonstrates proper use of MDI's       Goals/Expected Outcomes Compliance and understanding of oxygen  saturation monitoring and breathing techniques to decrease shortness of breath.          Oxygen  Discharge (Final Oxygen  Re-Evaluation):  Oxygen  Re-Evaluation - 03/17/24 0859       Program Oxygen  Prescription   Program Oxygen  Prescription Continuous    Liters per minute 6      Home Oxygen    Home Oxygen  Device Home Concentrator;E-Tanks    Sleep Oxygen  Prescription Continuous    Liters per minute 3    Home Exercise Oxygen  Prescription Continuous    Liters per minute 3    Home Resting Oxygen  Prescription Continuous    Liters per minute 3    Compliance with Home Oxygen  Use Yes      Goals/Expected Outcomes   Short Term Goals To learn and exhibit compliance with exercise, home and travel O2 prescription;To learn and understand importance of monitoring SPO2 with pulse oximeter and demonstrate accurate use of the pulse oximeter.;To learn and understand importance of maintaining oxygen  saturations>88%;To learn and demonstrate proper pursed lip breathing techniques or other breathing techniques. ;To learn and demonstrate proper use of respiratory medications    Long  Term Goals Exhibits compliance with exercise, home  and travel O2 prescription;Maintenance of O2 saturations>88%;Compliance with respiratory medication;Verbalizes importance of monitoring SPO2 with pulse oximeter and return demonstration;Exhibits proper  breathing techniques, such as pursed lip breathing or other method taught during program session;Demonstrates proper use of MDI's    Goals/Expected Outcomes Compliance and understanding of oxygen  saturation monitoring and breathing techniques to decrease shortness of breath.          Initial Exercise Prescription:  Initial Exercise Prescription - 03/14/24 1300       Date of Initial Exercise RX and Referring  Provider   Date 03/14/24    Referring Provider Dr. Theophilus    Expected Discharge Date 06/10/24      Oxygen    Oxygen  Continuous    Liters 3-6    Maintain Oxygen  Saturation 88% or higher      NuStep   Level 1    SPM 60    Minutes 15    METs 1.5      Track   Laps 10    Minutes 15    METs 1.8      Prescription Details   Frequency (times per week) 2    Duration Progress to 30 minutes of continuous aerobic without signs/symptoms of physical distress      Intensity   THRR 40-80% of Max Heartrate 58-116    Ratings of Perceived Exertion 11-13    Perceived Dyspnea 0-4      Progression   Progression Continue to progress workloads to maintain intensity without signs/symptoms of physical distress.      Resistance Training   Training Prescription No          Perform Capillary Blood Glucose checks as needed.  Exercise Prescription Changes:   Exercise Comments:   Exercise Goals and Review:   Exercise Goals     Row Name 03/14/24 1018             Exercise Goals   Increase Physical Activity Yes       Intervention Provide advice, education, support and counseling about physical activity/exercise needs.;Develop an individualized exercise prescription for aerobic and resistive training based on initial evaluation findings, risk stratification, comorbidities and participant's personal goals.       Expected Outcomes Short Term: Attend rehab on a regular basis to increase amount of physical activity.;Long Term: Add in home exercise to make exercise part of routine and  to increase amount of physical activity.;Long Term: Exercising regularly at least 3-5 days a week.       Increase Strength and Stamina Yes       Intervention Provide advice, education, support and counseling about physical activity/exercise needs.;Develop an individualized exercise prescription for aerobic and resistive training based on initial evaluation findings, risk stratification, comorbidities and participant's personal goals.       Expected Outcomes Short Term: Increase workloads from initial exercise prescription for resistance, speed, and METs.;Short Term: Perform resistance training exercises routinely during rehab and add in resistance training at home;Long Term: Improve cardiorespiratory fitness, muscular endurance and strength as measured by increased METs and functional capacity ( )       Able to understand and use rate of perceived exertion (RPE) scale Yes       Intervention Provide education and explanation on how to use RPE scale       Expected Outcomes Short Term: Able to use RPE daily in rehab to express subjective intensity level;Long Term:  Able to use RPE to guide intensity level when exercising independently       Able to understand and use Dyspnea scale Yes       Intervention Provide education and explanation on how to use Dyspnea scale       Expected Outcomes Short Term: Able to use Dyspnea scale daily in rehab to express subjective sense of shortness of breath during exertion;Long Term: Able to use Dyspnea scale to guide intensity level when exercising independently       Knowledge and understanding of Target Heart Rate Range (THRR) Yes       Intervention Provide education and explanation  of THRR including how the numbers were predicted and where they are located for reference       Expected Outcomes Short Term: Able to state/look up THRR;Long Term: Able to use THRR to govern intensity when exercising independently;Short Term: Able to use daily as guideline for intensity in  rehab       Understanding of Exercise Prescription Yes       Intervention Provide education, explanation, and written materials on patient's individual exercise prescription       Expected Outcomes Short Term: Able to explain program exercise prescription;Long Term: Able to explain home exercise prescription to exercise independently          Exercise Goals Re-Evaluation :  Exercise Goals Re-Evaluation     Row Name 03/17/24 0858             Exercise Goal Re-Evaluation   Exercise Goals Review Increase Physical Activity;Able to understand and use Dyspnea scale;Understanding of Exercise Prescription;Increase Strength and Stamina;Knowledge and understanding of Target Heart Rate Range (THRR);Able to understand and use rate of perceived exertion (RPE) scale       Comments Kisha is scheduled to begin exercise on 10/2.       Expected Outcomes Through exercise at rehab and home, the patient will decrease shortness of breath with daily activities and feel confident in carrying out an exercise regimen at home.          Discharge Exercise Prescription (Final Exercise Prescription Changes):   Nutrition:  Target Goals: Understanding of nutrition guidelines, daily intake of sodium 1500mg , cholesterol 200mg , calories 30% from fat and 7% or less from saturated fats, daily to have 5 or more servings of fruits and vegetables.  Biometrics:  Pre Biometrics - 03/14/24 1306       Pre Biometrics   Grip Strength 18 kg           Nutrition Therapy Plan and Nutrition Goals:   Nutrition Assessments:  MEDIFICTS Score Key: >=70 Need to make dietary changes  40-70 Heart Healthy Diet <= 40 Therapeutic Level Cholesterol Diet   Picture Your Plate Scores: <59 Unhealthy dietary pattern with much room for improvement. 41-50 Dietary pattern unlikely to meet recommendations for good health and room for improvement. 51-60 More healthful dietary pattern, with some room for improvement.  >60 Healthy  dietary pattern, although there may be some specific behaviors that could be improved.    Nutrition Goals Re-Evaluation:   Nutrition Goals Discharge (Final Nutrition Goals Re-Evaluation):   Psychosocial: Target Goals: Acknowledge presence or absence of significant depression and/or stress, maximize coping skills, provide positive support system. Participant is able to verbalize types and ability to use techniques and skills needed for reducing stress and depression.  Initial Review & Psychosocial Screening:  Initial Psych Review & Screening - 03/14/24 1050       Initial Review   Current issues with None Identified      Family Dynamics   Good Support System? Yes      Barriers   Psychosocial barriers to participate in program There are no identifiable barriers or psychosocial needs.      Screening Interventions   Interventions Encouraged to exercise    Expected Outcomes Short Term goal: Identification and review with participant of any Quality of Life or Depression concerns found by scoring the questionnaire.;Long Term goal: The participant improves quality of Life and PHQ9 Scores as seen by post scores and/or verbalization of changes          Quality of  Life Scores:  Scores of 19 and below usually indicate a poorer quality of life in these areas.  A difference of  2-3 points is a clinically meaningful difference.  A difference of 2-3 points in the total score of the Quality of Life Index has been associated with significant improvement in overall quality of life, self-image, physical symptoms, and general health in studies assessing change in quality of life.  PHQ-9: Review Flowsheet  More data exists      03/14/2024 12/17/2023 10/04/2023 08/15/2023 05/02/2023  Depression screen PHQ 2/9  Decreased Interest 0 0 0 0 0  Down, Depressed, Hopeless 0 0 0 0 0  PHQ - 2 Score 0 0 0 0 0  Altered sleeping 0 0 - 0 0  Tired, decreased energy 1 3 - 0 0  Change in appetite 0 0 - 0 0   Feeling bad or failure about yourself  0 0 - 0 0  Trouble concentrating 0 0 - 0 0  Moving slowly or fidgety/restless 0 0 - 0 0  Suicidal thoughts 0 - - 0 0  PHQ-9 Score 1 3 - 0 0  Difficult doing work/chores Somewhat difficult Not difficult at all - Not difficult at all Not difficult at all   Interpretation of Total Score  Total Score Depression Severity:  1-4 = Minimal depression, 5-9 = Mild depression, 10-14 = Moderate depression, 15-19 = Moderately severe depression, 20-27 = Severe depression   Psychosocial Evaluation and Intervention:  Psychosocial Evaluation - 03/14/24 1302       Psychosocial Evaluation & Interventions   Interventions Encouraged to exercise with the program and follow exercise prescription    Comments Lauriana denies any psychosocial barriers or concerns at this time.    Expected Outcomes For Desha to participate in PR free of any psychosocial barriers or concerns    Continue Psychosocial Services  No Follow up required          Psychosocial Re-Evaluation:  Psychosocial Re-Evaluation     Row Name 03/18/24 [phone]             Psychosocial Re-Evaluation   Current issues with None Identified       Comments Maddelyn is scheduled to start PR on 03/20/24. No new psychosocial barriers or concerns since orientation.       Expected Outcomes For Tamberlyn to participate in PR free of any psychosocial barriers or concerns at this time.       Interventions Encouraged to attend Pulmonary Rehabilitation for the exercise       Continue Psychosocial Services  No Follow up required          Psychosocial Discharge (Final Psychosocial Re-Evaluation):  Psychosocial Re-Evaluation - 03/18/24 0741       Psychosocial Re-Evaluation   Current issues with None Identified    Comments Josephyne is scheduled to start PR on 03/20/24. No new psychosocial barriers or concerns since orientation.    Expected Outcomes For Rema to participate in PR free of any psychosocial barriers or  concerns at this time.    Interventions Encouraged to attend Pulmonary Rehabilitation for the exercise    Continue Psychosocial Services  No Follow up required          Education: Education Goals: Education classes will be provided on a weekly basis, covering required topics. Participant will state understanding/return demonstration of topics presented.  Learning Barriers/Preferences:  Learning Barriers/Preferences - 03/14/24 1050       Learning Barriers/Preferences   Learning Barriers Sight  wears glasses   Learning Preferences None          Education Topics: Know Your Numbers Group instruction that is supported by a PowerPoint presentation. Instructor discusses importance of knowing and understanding resting, exercise, and post-exercise oxygen  saturation, heart rate, and blood pressure. Oxygen  saturation, heart rate, blood pressure, rating of perceived exertion, and dyspnea are reviewed along with a normal range for these values.    Exercise for the Pulmonary Patient Group instruction that is supported by a PowerPoint presentation. Instructor discusses benefits of exercise, core components of exercise, frequency, duration, and intensity of an exercise routine, importance of utilizing pulse oximetry during exercise, safety while exercising, and options of places to exercise outside of rehab.    MET Level  Group instruction provided by PowerPoint, verbal discussion, and written material to support subject matter. Instructor reviews what METs are and how to increase METs.    Pulmonary Medications Verbally interactive group education provided by instructor with focus on inhaled medications and proper administration.   Anatomy and Physiology of the Respiratory System Group instruction provided by PowerPoint, verbal discussion, and written material to support subject matter. Instructor reviews respiratory cycle and anatomical components of the respiratory system and their  functions. Instructor also reviews differences in obstructive and restrictive respiratory diseases with examples of each.    Oxygen  Safety Group instruction provided by PowerPoint, verbal discussion, and written material to support subject matter. There is an overview of "What is Oxygen " and "Why do we need it".  Instructor also reviews how to create a safe environment for oxygen  use, the importance of using oxygen  as prescribed, and the risks of noncompliance. There is a brief discussion on traveling with oxygen  and resources the patient may utilize.   Oxygen  Use Group instruction provided by PowerPoint, verbal discussion, and written material to discuss how supplemental oxygen  is prescribed and different types of oxygen  supply systems. Resources for more information are provided.    Breathing Techniques Group instruction that is supported by demonstration and informational handouts. Instructor discusses the benefits of pursed lip and diaphragmatic breathing and detailed demonstration on how to perform both.     Risk Factor Reduction Group instruction that is supported by a PowerPoint presentation. Instructor discusses the definition of a risk factor, different risk factors for pulmonary disease, and how the heart and lungs work together.   Pulmonary Diseases Group instruction provided by PowerPoint, verbal discussion, and written material to support subject matter. Instructor gives an overview of the different type of pulmonary diseases. There is also a discussion on risk factors and symptoms as well as ways to manage the diseases.   Stress and Energy Conservation Group instruction provided by PowerPoint, verbal discussion, and written material to support subject matter. Instructor gives an overview of stress and the impact it can have on the body. Instructor also reviews ways to reduce stress. There is also a discussion on energy conservation and ways to conserve energy throughout the  day.   Warning Signs and Symptoms Group instruction provided by PowerPoint, verbal discussion, and written material to support subject matter. Instructor reviews warning signs and symptoms of stroke, heart attack, cold and flu. Instructor also reviews ways to prevent the spread of infection.   Other Education Group or individual verbal, written, or video instructions that support the educational goals of the pulmonary rehab program.    Knowledge Questionnaire Score:  Knowledge Questionnaire Score - 03/14/24 1031       Knowledge Questionnaire Score   Pre Score  15/18          Core Components/Risk Factors/Patient Goals at Admission:  Personal Goals and Risk Factors at Admission - 03/14/24 1051       Core Components/Risk Factors/Patient Goals on Admission   Improve shortness of breath with ADL's Yes    Intervention Provide education, individualized exercise plan and daily activity instruction to help decrease symptoms of SOB with activities of daily living.    Expected Outcomes Short Term: Improve cardiorespiratory fitness to achieve a reduction of symptoms when performing ADLs;Long Term: Be able to perform more ADLs without symptoms or delay the onset of symptoms    Increase knowledge of respiratory medications and ability to use respiratory devices properly  Yes    Intervention Provide education and demonstration as needed of appropriate use of medications, inhalers, and oxygen  therapy.    Expected Outcomes Short Term: Achieves understanding of medications use. Understands that oxygen  is a medication prescribed by physician. Demonstrates appropriate use of inhaler and oxygen  therapy.;Long Term: Maintain appropriate use of medications, inhalers, and oxygen  therapy.          Core Components/Risk Factors/Patient Goals Review:   Goals and Risk Factor Review     Row Name 03/18/24 0743             Core Components/Risk Factors/Patient Goals Review   Personal Goals Review  Improve shortness of breath with ADL's;Develop more efficient breathing techniques such as purse lipped breathing and diaphragmatic breathing and practicing self-pacing with activity.;Increase knowledge of respiratory medications and ability to use respiratory devices properly.       Review Neeya is scheduled to start PR on 03/20/24. Unable to asses goals at this time.       Expected Outcomes To improve shortness of breath with ADL's, develop more efficient breathing techniques such as purse lipped breathing and diaphragmatic breathing; and practicing self-pacing with activity and increase knowledge of respiratory medications.          Core Components/Risk Factors/Patient Goals at Discharge (Final Review):   Goals and Risk Factor Review - 03/18/24 0743       Core Components/Risk Factors/Patient Goals Review   Personal Goals Review Improve shortness of breath with ADL's;Develop more efficient breathing techniques such as purse lipped breathing and diaphragmatic breathing and practicing self-pacing with activity.;Increase knowledge of respiratory medications and ability to use respiratory devices properly.    Review Dalonda is scheduled to start PR on 03/20/24. Unable to asses goals at this time.    Expected Outcomes To improve shortness of breath with ADL's, develop more efficient breathing techniques such as purse lipped breathing and diaphragmatic breathing; and practicing self-pacing with activity and increase knowledge of respiratory medications.          ITP Comments: Pt is making expected progress toward Pulmonary Rehab goals after completing 0 session(s). Recommend continued exercise, life style modification, education, and utilization of breathing techniques to increase stamina and strength, while also decreasing shortness of breath with exertion.  Dr. Slater Staff is Medical Director for Pulmonary Rehab at [facility].

## 2024-03-20 ENCOUNTER — Telehealth (HOSPITAL_COMMUNITY): Payer: Self-pay

## 2024-03-20 ENCOUNTER — Encounter (HOSPITAL_COMMUNITY)

## 2024-03-20 NOTE — Telephone Encounter (Signed)
 Patient c/o for 10:15 PR class, states it is supposed to be her 1st day of class but she is very out of breath and can't make it today.

## 2024-03-21 MED ORDER — GABAPENTIN 600 MG PO TABS: 600.0000 mg | ORAL_TABLET | Freq: Four times a day (QID) | ORAL | 3 refills | Status: DC

## 2024-03-25 ENCOUNTER — Telehealth (HOSPITAL_COMMUNITY): Payer: Self-pay

## 2024-03-25 ENCOUNTER — Encounter (HOSPITAL_COMMUNITY): Admission: RE | Admit: 2024-03-25 | Source: Ambulatory Visit

## 2024-03-25 NOTE — Telephone Encounter (Signed)
 Patient left message after start of 10:15 PR class stating she would not be coming in for that class today, asked us  to call her back. Returned patient's call, she stated she was busy on another call and would call us  back later.

## 2024-03-25 NOTE — Telephone Encounter (Signed)
 Patient called stating she missed her 10:15 PR appt due to a dr appt she had forgotten about, states she will start her 1st day of class on Thursday 10/09.

## 2024-03-26 ENCOUNTER — Telehealth: Payer: Self-pay | Admitting: *Deleted

## 2024-03-26 ENCOUNTER — Other Ambulatory Visit: Payer: Self-pay | Admitting: Pharmacist

## 2024-03-26 DIAGNOSIS — J849 Interstitial pulmonary disease, unspecified: Secondary | ICD-10-CM

## 2024-03-26 NOTE — Telephone Encounter (Signed)
 Error

## 2024-03-26 NOTE — Telephone Encounter (Signed)
 Copied from CRM [id number]. Topic: Clinical - Order For Equipment >> Mar 26, 2024 11:40 AM Graeme ORN wrote: Reason for CRM: Patient called states she needs - order sent to [employer] for water bottle to attach to oxygen  concentrator.  [employer] provider advised it was needed and has to come from pcp. Thank You

## 2024-03-27 ENCOUNTER — Telehealth: Payer: Self-pay

## 2024-03-27 ENCOUNTER — Telehealth (HOSPITAL_COMMUNITY): Payer: Self-pay

## 2024-03-27 ENCOUNTER — Encounter (HOSPITAL_COMMUNITY): Admission: RE | Admit: 2024-03-27 | Source: Ambulatory Visit

## 2024-03-27 NOTE — Telephone Encounter (Signed)
 Patient called earlier stating she was having car trouble and would try to make the 10:15 PR class but if she did not show up that would be why. Patient did not arrive, will assume she will start her 1st day of 10:15 PR on 10/14.

## 2024-03-27 NOTE — Telephone Encounter (Signed)
 Copied from CRM [id number]. Topic: Clinical - Prescription Issue >> Mar 27, 2024 11:57 AM Leila BROCKS wrote: Reason for CRM: Cheryl from [facility] [phone] states physician Dr. Praveen Mannam cancelled patient's prescription Nintedanib (OFEV ) 150 MG CAPS and they need to know if this correct? Please advise and call back.  Dr Theophilus, please advise

## 2024-03-27 NOTE — Telephone Encounter (Signed)
 Duplicate note

## 2024-04-01 ENCOUNTER — Encounter (HOSPITAL_COMMUNITY)
Admission: RE | Admit: 2024-04-01 | Discharge: 2024-04-01 | Disposition: A | Discharge status: Home / Self Care | Source: Ambulatory Visit | Attending: Pulmonary Disease | Admitting: Pulmonary Disease

## 2024-04-01 VITALS — Wt 218.3 lb

## 2024-04-01 DIAGNOSIS — J849 Interstitial pulmonary disease, unspecified: Secondary | ICD-10-CM | POA: Insufficient documentation

## 2024-04-01 NOTE — Telephone Encounter (Signed)
 Yes.  She declined to take the medication.  Will reassess at her follow-up visit.

## 2024-04-01 NOTE — Progress Notes (Signed)
 Daily Session Note  Patient Details  Name: Laura [name] MRN: [id number] Date of Birth: 08-20-1945 Referring Provider:   Conrad Ports Pulmonary Rehab Walk Test from 03/14/2024 in [facility]  Referring Provider Dr. Theophilus    Encounter Date: 04/01/2024  Check In:  Session Check In - 04/01/24 1123       Check-In   Supervising physician immediately available to respond to emergencies [facility] MD immediately available    Physician(s) Orren Fabry, NP    Location [facility]    Staff Present Ronal Levin, RN, BSN;Sybol Morre Claudene, RT;Johnny Fayette, MS, Exercise Physiologist;Kaylee Nicholaus, MS, ACSM-CEP, Exercise Physiologist;Randi Midge BS, ACSM-CEP, Exercise Physiologist    Virtual Visit No    Medication changes reported     No    Fall or balance concerns reported    Yes    Comments walks with a cane for balance    Tobacco Cessation No Change    Warm-up and Cool-down Performed as group-led instruction    Resistance Training Performed Yes    VAD Patient? No    PAD/SET Patient? No      Pain Assessment   Currently in Pain? No/denies    Multiple Pain Sites No          Capillary Blood Glucose: No results found for this or any previous visit (from the past 24 hours).   Exercise Prescription Changes - 04/01/24 1100       Response to Exercise   Blood Pressure (Admit) 120/70    Blood Pressure (Exercise) 140/64    Blood Pressure (Exit) 116/72    Heart Rate (Admit) 82 bpm    Heart Rate (Exercise) 103 bpm    Heart Rate (Exit) 82 bpm    Oxygen  Saturation (Admit) 95 %    Oxygen  Saturation (Exercise) 98 %    Oxygen  Saturation (Exit) 96 %    Rating of Perceived Exertion (Exercise) 9    Perceived Dyspnea (Exercise) 0    Duration Continue with 30 min of aerobic exercise without signs/symptoms of physical distress.    Intensity THRR unchanged      Progression   Progression Continue to progress workloads to  maintain intensity without signs/symptoms of physical distress.      Resistance Training   Training Prescription Yes    Weight red bands    Reps 10-15    Time 10 Minutes      Oxygen    Oxygen  Continuous    Liters 6      NuStep   Level 2    SPM 58    Minutes 15    METs 1.5      Track   Laps 5    Minutes 15    METs 1.77      Oxygen    Maintain Oxygen  Saturation 88% or higher          Social History   Tobacco Use  Smoking Status Former   Current packs/day: 0.00   Average packs/day: 0.5 packs/day for 47.0 years (23.5 ttl pk-yrs)   Types: Cigarettes   Start date: 05/13/1973   Quit date: 05/13/2020   Years since quitting: 3.8  Smokeless Tobacco Never  Tobacco Comments   stopped around Thanksgiving     Goals Met:  Proper associated with RPD/PD & O2 Sat Independence with exercise equipment Exercise tolerated well No report of concerns or symptoms today Strength training completed today  Goals Unmet:  Not Applicable  Comments:  Service time is from 1006 to 1132.    Dr. Slater Staff is Medical Director for Pulmonary Rehab at [facility].

## 2024-04-02 NOTE — Telephone Encounter (Signed)
 Called [facility] and advised that patient elected not to move forward with Ofev  start. Per rep, patient called twice on 03/25/2024 to set up shipment. They state that if patient has changed her mind, they still need MDO to rovide rx clarification.  Reached out to patient via MyChart first to clarify if she wants to now proceed with treatment. If she does, will need to cal [facility] to provide rx clarification on Ofev  and allow for shipment.  Sherry Pennant, PharmD, MPH, BCPS, CPP Clinical Pharmacist [facility])

## 2024-04-02 NOTE — Addendum Note (Signed)
 Addended by: Deborha Moseley L on: 04/02/2024 11:26 AM   Modules accepted: Orders

## 2024-04-02 NOTE — Addendum Note (Signed)
 Addended by: DAYNE SHERRY RAMAN on: 04/02/2024 08:23 AM   Modules accepted: Orders

## 2024-04-02 NOTE — Telephone Encounter (Signed)
 Per patient message - reports she is willing to take medication.   Called [facility] to provide new verbal order given previous order was cancelled.  Ofev  150mg  capsule - Take 1 capsule by mouth twice daily with food. QTY 60. Refills: 1  Aleck Puls, PharmD, BCPS, CPP Clinical Pharmacist  [facility]

## 2024-04-02 NOTE — Telephone Encounter (Signed)
 Copied from CRM #[id number]. Topic: Clinical - Prescription Issue >> Apr 02, 2024 11:32 AM [name] wrote: Reason for CRM: Patient called from [phone], stated that she's been constantly calling about the water concentrator to hook on to her oxygen  machine. During the process of going over her PHI pt stated she didn't see the reason for that, I explained that to her on how it protects her identity as well as the ability to assist her with the call. Patient again stated that she hasn't heard anything about medical records she called and requested most importantly about water bottle from [employer], she stated the nurse from [employer] stated she needed this for her oxygen  machine but it had to be a prescription written by her pcp. Patient is extremely frustrated and while I was speaking with her she asked me why did I have her on hold, I explained I had placed her on hold one time to search her chart but while speaking with her she was not on hold. She continued to over talk me and she asked me why is it I wasn't doing my job I advised her that I was trying to get as much detailed information as possible so that she can be assisted. Patient asked me why is it I keep talking and then disconnected the call on me.

## 2024-04-03 ENCOUNTER — Telehealth (HOSPITAL_COMMUNITY): Payer: Self-pay

## 2024-04-03 ENCOUNTER — Telehealth: Payer: Self-pay

## 2024-04-03 ENCOUNTER — Encounter (HOSPITAL_COMMUNITY)
Admission: RE | Admit: 2024-04-03 | Discharge: 2024-04-03 | Disposition: A | Discharge status: Home / Self Care | Source: Ambulatory Visit | Attending: Pulmonary Disease | Admitting: Pulmonary Disease

## 2024-04-03 NOTE — Telephone Encounter (Signed)
 Copied from CRM #[id number]. Topic: Medical Record Request - Other >> Apr 02, 2024 11:42 AM [name] wrote: Reason for CRM: Patient called from [phone], she is wanting to know why hasn't she received her medical records as of yet. She states that she called in about a month ago requesting records, I advised the patient that was a different department and I noticed the first call on the 03/05/2024 I also advised her that the standard turn around time is 7-10 business days however it may take longer patient was upset. Please call patient and advise

## 2024-04-03 NOTE — Telephone Encounter (Signed)
 I contacted pt to let her know that I have not received anything from her requesting medical records. Pt states she spoke with someone, but cannot remember the person that she spoke with. I offered to give pt the medical records department number, but pt declined. Pt states she already has the number. Advised pt she can come to [facility] office and signed medical record release form and I can faxed it to HIM [facility] department, pt declined. Pt states she will contact medical records department herself. Advised pt to cal the office back if she needs further assistance.

## 2024-04-03 NOTE — Telephone Encounter (Signed)
 Patient c/o for 10:15 PR class, states she had a birthday celebration this morning that ran late.

## 2024-04-04 ENCOUNTER — Other Ambulatory Visit

## 2024-04-04 ENCOUNTER — Ambulatory Visit: Payer: Self-pay

## 2024-04-04 DIAGNOSIS — Z5181 Encounter for therapeutic drug level monitoring: Secondary | ICD-10-CM

## 2024-04-04 DIAGNOSIS — J849 Interstitial pulmonary disease, unspecified: Secondary | ICD-10-CM

## 2024-04-04 LAB — HEPATIC FUNCTION PANEL
ALT: 22 U/L (ref 0–35)
AST: 20 U/L (ref 0–37)
Albumin: 3.7 g/dL (ref 3.5–5.2)
Alkaline Phosphatase: 87 U/L (ref 39–117)
Bilirubin, Direct: 0.3 mg/dL (ref 0.0–0.3)
Total Bilirubin: 0.6 mg/dL (ref 0.2–1.2)
Total Protein: 7.9 g/dL (ref 6.0–8.3)

## 2024-04-04 NOTE — Telephone Encounter (Signed)
 See phone thread 03/27/24 - patient later decided to start Ofev .

## 2024-04-08 ENCOUNTER — Encounter (HOSPITAL_COMMUNITY): Admission: RE | Admit: 2024-04-08 | Source: Ambulatory Visit

## 2024-04-09 ENCOUNTER — Telehealth: Payer: Self-pay

## 2024-04-09 ENCOUNTER — Telehealth (HOSPITAL_COMMUNITY): Payer: Self-pay

## 2024-04-09 NOTE — Telephone Encounter (Signed)
 Pt returned call from [facility]. Pt decided to withdrawal from program due to stress of getting to/from appointments. Informed pt that she will need a new referral and we would be happy to have her back in the future. Pt understands without assistance.

## 2024-04-09 NOTE — Telephone Encounter (Addendum)
 Pt called today she is not happy with the care she is getting at the clinic and wanted to  voice her concerns . I told her I would send her through to you vmail and also send a chart message  to you  so you know she had left a message.

## 2024-04-10 ENCOUNTER — Telehealth: Payer: Self-pay | Admitting: Internal Medicine

## 2024-04-10 ENCOUNTER — Encounter (HOSPITAL_COMMUNITY)

## 2024-04-10 ENCOUNTER — Telehealth (INDEPENDENT_AMBULATORY_CARE_PROVIDER_SITE_OTHER): Admitting: Student

## 2024-04-10 DIAGNOSIS — J849 Interstitial pulmonary disease, unspecified: Secondary | ICD-10-CM

## 2024-04-10 DIAGNOSIS — J84112 Idiopathic pulmonary fibrosis: Secondary | ICD-10-CM

## 2024-04-10 DIAGNOSIS — Z0279 Encounter for issue of other medical certificate: Secondary | ICD-10-CM

## 2024-04-10 NOTE — Progress Notes (Unsigned)
   CC: DME referral for humidifier.  This is a telephone encounter between Laura [name] and Laura [name] on 04/11/2024 for DME referral for humidifier. The visit was conducted with the patient located at home and Laura [name] at [facility]. The patient's identity was confirmed using their DOB and current address. The patient has consented to being evaluated through a telephone encounter and understands the associated risks (an examination cannot be done and the patient may need to come in for an appointment) / benefits (allows the patient to remain at home, decreasing exposure to coronavirus). I personally spent 15 minutes on medical discussion.   HPI:  [name] is a 78 y.o. with PMH as below.   Please see A&P for assessment of the patient's acute and chronic medical conditions.   Past Medical History:  Diagnosis Date   Chronic back pain    mostly lower; left foot is numb all the time (11/22/2016)   Chronic kidney disease    ?renal cyst on MRI    GERD (gastroesophageal reflux disease)    Health care maintenance 12/31/2012   High cholesterol    IPF (idiopathic pulmonary fibrosis) (HCC)    Lymphadenopathy    documented on chest CT scan 12/15/2005   Substance abuse (HCC)    tobacco   Review of Systems:  negative    Assessment & Plan:   No problem-specific Assessment & Plan notes found for this encounter.  Idiopathic pulmonary fibrosis Chronic hypoxemic respiratory failure, currently requiring 2-3 L O? via nasal cannula. Reports increased nasal congestion associated with oxygen  use. Afebrile, no productive cough, and no increased O? requirement -- low suspicion for pneumonia.  Allergic rhinitis also less likely, denies no relation to the weather. Follows with Dr. Theophilus (pulmonology) and palliative care.  Plan: -Reasonable to place a DME referral for humidifier attachment and water concentrator to improve comfort and reduce congestion related to oxygen  use.  Patient  discussed with Dr. Francesco Laura Sandhoff, MD  Internal Medicine Resident

## 2024-04-10 NOTE — Telephone Encounter (Signed)
 Discussed some frustrations patient was having with her overall health and care.  Provided patient detailed account of the progression of her lung disease and how it initially appeared on imaging as mild COPD/emphysema which is not unexpected in someone smoking.  On follow up lung nodule screening her nodules were stable but her lung scarring and damage progressed in the setting of continued smoking with some concern for development of ILD with the recommendation for PFT's and ILD protocol CT in 6-12 months and she was referred to pulmonology and sent for PFT's at that time.  All throughout this time she never had a hospitalization or trouble with dyspnea or hypoxemia.  We talked about progression of lung disease and how it is a dynamic process and that no one was keeping any information or treatments from her.  She was also under the impression that inhaler therapy was withheld from her and that it could have prevented damage to her lungs which is not the case.  Additionally she has frustrations with the new after hours call service for our clinic and feels like she can never get in touch with anyone at our clinic during business hours and that we are not returning her calls.  Overall she was happy that I let her voice her frustrations and felt better about the situation overall.

## 2024-04-11 NOTE — Progress Notes (Signed)
 Discharge Progress Report  Patient Details  Name: Laura [name] MRN: [id number] Date of Birth: 02-18-1946 Referring Provider:   [facility] Walk Test from 03/14/2024 in [facility]  Referring Provider Dr. Theophilus     Number of Visits: 1  Reason for Discharge:  Early Exit:  Personal  Smoking History:  Social History   Tobacco Use  Smoking Status Former   Current packs/day: 0.00   Average packs/day: 0.5 packs/day for 47.0 years (23.5 ttl pk-yrs)   Types: Cigarettes   Start date: 05/13/1973   Quit date: 05/13/2020   Years since quitting: 3.9  Smokeless Tobacco Never  Tobacco Comments   stopped around Thanksgiving     Diagnosis:  ILD (interstitial lung disease) (HCC)  ADL UCSD:  Pulmonary Assessment Scores     Row Name 03/14/24 1033         ADL UCSD   ADL Phase Entry     SOB Score total 92       CAT Score   CAT Score 31       mMRC Score   mMRC Score 4        Initial Exercise Prescription:  Initial Exercise Prescription - 03/14/24 1300       Date of Initial Exercise RX and Referring Provider   Date 03/14/24    Referring Provider Dr. Theophilus    Expected Discharge Date 06/10/24      Oxygen    Oxygen  Continuous    Liters 3-6    Maintain Oxygen  Saturation 88% or higher      NuStep   Level 1    SPM 60    Minutes 15    METs 1.5      Track   Laps 10    Minutes 15    METs 1.8      Prescription Details   Frequency (times per week) 2    Duration Progress to 30 minutes of continuous aerobic without signs/symptoms of physical distress      Intensity   THRR 40-80% of Max Heartrate 58-116    Ratings of Perceived Exertion 11-13    Perceived Dyspnea 0-4      Progression   Progression Continue to progress workloads to maintain intensity without signs/symptoms of physical distress.      Resistance Training   Training Prescription No          Discharge Exercise  Prescription (Final Exercise Prescription Changes):  Exercise Prescription Changes - 04/01/24 1100       Response to Exercise   Blood Pressure (Admit) 120/70    Blood Pressure (Exercise) 140/64    Blood Pressure (Exit) 116/72    Heart Rate (Admit) 82 bpm    Heart Rate (Exercise) 103 bpm    Heart Rate (Exit) 82 bpm    Oxygen  Saturation (Admit) 95 %    Oxygen  Saturation (Exercise) 98 %    Oxygen  Saturation (Exit) 96 %    Rating of Perceived Exertion (Exercise) 9    Perceived Dyspnea (Exercise) 0    Duration Continue with 30 min of aerobic exercise without signs/symptoms of physical distress.    Intensity THRR unchanged      Progression   Progression Continue to progress workloads to maintain intensity without signs/symptoms of physical distress.      Resistance Training   Training Prescription Yes    Weight red bands    Reps 10-15    Time 10 Minutes  Oxygen    Oxygen  Continuous    Liters 6      NuStep   Level 2    SPM 58    Minutes 15    METs 1.5      Track   Laps 5    Minutes 15    METs 1.77      Oxygen    Maintain Oxygen  Saturation 88% or higher          Functional Capacity:  6 Minute Walk     Row Name 03/14/24 1303         6 Minute Walk   Phase Initial     Distance 660 feet     Walk Time 6 minutes     # of Rest Breaks 1  2:55-3:47     MPH 1.25     METS 1.32     RPE 11     Perceived Dyspnea  2     VO2 Peak 4.64     Symptoms Yes (comment)     Comments back and hip pain     Resting HR 100 bpm     Resting BP 112/60     Resting Oxygen  Saturation  93 %     Exercise Oxygen  Saturation  during 6 min walk 85 %     Max Ex. HR 112 bpm     Max Ex. BP 132/60     2 Minute Post BP 122/60       Interval HR   1 Minute HR 100     2 Minute HR 104     3 Minute HR 102     4 Minute HR 101     5 Minute HR 100     6 Minute HR 112     2 Minute Post HR 86     Interval Heart Rate? Yes       Interval Oxygen    Interval Oxygen ? Yes     Baseline Oxygen   Saturation % 93 %     1 Minute Oxygen  Saturation % 95 %     1 Minute Liters of Oxygen  3 L     2 Minute Oxygen  Saturation % 90 %     2 Minute Liters of Oxygen  3 L     3 Minute Oxygen  Saturation % 85 %     3 Minute Liters of Oxygen  3 L  increased to 4L     4 Minute Oxygen  Saturation % 90 %     4 Minute Liters of Oxygen  4 L     5 Minute Oxygen  Saturation % 90 %     5 Minute Liters of Oxygen  4 L     6 Minute Oxygen  Saturation % 87 %     6 Minute Liters of Oxygen  4 L  increased to 6L     2 Minute Post Oxygen  Saturation % 96 %     2 Minute Post Liters of Oxygen  6 L        Psychological, QOL, Others - Outcomes: PHQ 2/9:    03/14/2024   10:23 AM 12/17/2023   10:59 AM 10/04/2023    3:31 PM 08/15/2023    1:08 PM 05/02/2023   11:33 AM  Depression screen PHQ 2/9  Decreased Interest 0 0 0 0 0  Down, Depressed, Hopeless 0 0 0 0 0  PHQ - 2 Score 0 0 0 0 0  Altered sleeping 0 0  0 0  Tired, decreased energy 1 3  0 0  Change in appetite 0 0  0 0  Feeling bad or failure about yourself  0 0  0 0  Trouble concentrating 0 0  0 0  Moving slowly or fidgety/restless 0 0  0 0  Suicidal thoughts 0   0 0  PHQ-9 Score 1 3  0 0  Difficult doing work/chores Somewhat difficult Not difficult at all  Not difficult at all Not difficult at all    Quality of Life:   Personal Goals: Goals established at orientation with interventions provided to work toward goal.  Personal Goals and Risk Factors at Admission - 03/14/24 1051       Core Components/Risk Factors/Patient Goals on Admission   Improve shortness of breath with ADL's Yes    Intervention Provide education, individualized exercise plan and daily activity instruction to help decrease symptoms of SOB with activities of daily living.    Expected Outcomes Short Term: Improve cardiorespiratory fitness to achieve a reduction of symptoms when performing ADLs;Long Term: Be able to perform more ADLs without symptoms or delay the onset of symptoms     Increase knowledge of respiratory medications and ability to use respiratory devices properly  Yes    Intervention Provide education and demonstration as needed of appropriate use of medications, inhalers, and oxygen  therapy.    Expected Outcomes Short Term: Achieves understanding of medications use. Understands that oxygen  is a medication prescribed by physician. Demonstrates appropriate use of inhaler and oxygen  therapy.;Long Term: Maintain appropriate use of medications, inhalers, and oxygen  therapy.           Personal Goals Discharge:  Goals and Risk Factor Review     Row Name 03/18/24 0743 04/07/24 1320 04/11/24 0947         Core Components/Risk Factors/Patient Goals Review   Personal Goals Review Improve shortness of breath with ADL's;Develop more efficient breathing techniques such as purse lipped breathing and diaphragmatic breathing and practicing self-pacing with activity.;Increase knowledge of respiratory medications and ability to use respiratory devices properly. Improve shortness of breath with ADL's;Develop more efficient breathing techniques such as purse lipped breathing and diaphragmatic breathing and practicing self-pacing with activity.;Increase knowledge of respiratory medications and ability to use respiratory devices properly. Improve shortness of breath with ADL's;Develop more efficient breathing techniques such as purse lipped breathing and diaphragmatic breathing and practicing self-pacing with activity.;Increase knowledge of respiratory medications and ability to use respiratory devices properly.     Review Laura [name] is scheduled to start PR on 03/20/24. Unable to asses goals at this time. Monthly review of patient's Core Components/Risk Factors/Patient Goals are as follows: Goal progressing for improving shortness of breath with ADL's. Laura [name] is currently using 6L while exercising to keep sats >88%. Goal progressing for developing more efficient breathing techniques such as  purse lipped breathing and diaphragmatic breathing; and practicing self-pacing with activity. Goal progressing for increase knowledge of respiratory medications and ability to use respiratory devices properly. We will continue to monitor her progress throughout the program. Laura [name] was discharged from the PR program on 04/10/24. Unfortunately, Laura [name] did not meet any of her goals as she only attended one session.     Expected Outcomes To improve shortness of breath with ADL's, develop more efficient breathing techniques such as purse lipped breathing and diaphragmatic breathing; and practicing self-pacing with activity and increase knowledge of respiratory medications. To improve shortness of breath with ADL's, develop more efficient breathing techniques such as purse lipped breathing and diaphragmatic breathing; and practicing self-pacing with activity and increase knowledge of  respiratory medications. To continue to exercise post discharge        Exercise Goals and Review:  Exercise Goals     Row Name 03/14/24 1018             Exercise Goals   Increase Physical Activity Yes       Intervention Provide advice, education, support and counseling about physical activity/exercise needs.;Develop an individualized exercise prescription for aerobic and resistive training based on initial evaluation findings, risk stratification, comorbidities and participant's personal goals.       Expected Outcomes Short Term: Attend rehab on a regular basis to increase amount of physical activity.;Long Term: Add in home exercise to make exercise part of routine and to increase amount of physical activity.;Long Term: Exercising regularly at least 3-5 days a week.       Increase Strength and Stamina Yes       Intervention Provide advice, education, support and counseling about physical activity/exercise needs.;Develop an individualized exercise prescription for aerobic and resistive training based on initial evaluation  findings, risk stratification, comorbidities and participant's personal goals.       Expected Outcomes Short Term: Increase workloads from initial exercise prescription for resistance, speed, and METs.;Short Term: Perform resistance training exercises routinely during rehab and add in resistance training at home;Long Term: Improve cardiorespiratory fitness, muscular endurance and strength as measured by increased METs and functional capacity ( )       Able to understand and use rate of perceived exertion (RPE) scale Yes       Intervention Provide education and explanation on how to use RPE scale       Expected Outcomes Short Term: Able to use RPE daily in rehab to express subjective intensity level;Long Term:  Able to use RPE to guide intensity level when exercising independently       Able to understand and use Dyspnea scale Yes       Intervention Provide education and explanation on how to use Dyspnea scale       Expected Outcomes Short Term: Able to use Dyspnea scale daily in rehab to express subjective sense of shortness of breath during exertion;Long Term: Able to use Dyspnea scale to guide intensity level when exercising independently       Knowledge and understanding of Target Heart Rate Range (THRR) Yes       Intervention Provide education and explanation of THRR including how the numbers were predicted and where they are located for reference       Expected Outcomes Short Term: Able to state/look up THRR;Long Term: Able to use THRR to govern intensity when exercising independently;Short Term: Able to use daily as guideline for intensity in rehab       Understanding of Exercise Prescription Yes       Intervention Provide education, explanation, and written materials on patient's individual exercise prescription       Expected Outcomes Short Term: Able to explain program exercise prescription;Long Term: Able to explain home exercise prescription to exercise independently          Exercise  Goals Re-Evaluation:  Exercise Goals Re-Evaluation     Row Name 03/17/24 0858 04/04/24 1040 04/10/24 1542         Exercise Goal Re-Evaluation   Exercise Goals Review Increase Physical Activity;Able to understand and use Dyspnea scale;Understanding of Exercise Prescription;Increase Strength and Stamina;Knowledge and understanding of Target Heart Rate Range (THRR);Able to understand and use rate of perceived exertion (RPE) scale Increase Physical Activity;Able to understand and  use Dyspnea scale;Understanding of Exercise Prescription;Increase Strength and Stamina;Knowledge and understanding of Target Heart Rate Range (THRR);Able to understand and use rate of perceived exertion (RPE) scale Increase Physical Activity;Able to understand and use Dyspnea scale;Understanding of Exercise Prescription;Increase Strength and Stamina;Knowledge and understanding of Target Heart Rate Range (THRR);Able to understand and use rate of perceived exertion (RPE) scale     Comments Laura [name] is scheduled to begin exercise on 10/2. Laura [name] has completed 1 exercise sessions. She exercises for 15 min on the track and Nustep. She averages 1.77 METs on the track and 1.5 METs at level 1 on the Nustep. She performs the warmup and cooldown standing/ seated dependent on her shortness of breath. Laura [name] has had very inconsistent attendance thus far. I am unsure how much I can progress her due to this. Will continue to monitor and progress as able. Laura [name] has completed 1 exercise session. Her peak METs were 1.77 on the track and 1.5 METs on the Nustep. Pt wanted to be discharged from the program.     Expected Outcomes Through exercise at rehab and home, the patient will decrease shortness of breath with daily activities and feel confident in carrying out an exercise regimen at home. Through exercise at rehab and home, the patient will decrease shortness of breath with daily activities and feel confident in carrying out an exercise regimen at  home. Through exercise at rehab and home, the patient will decrease shortness of breath with daily activities and feel confident in carrying out an exercise regimen at home.        Nutrition & Weight - Outcomes:  Pre Biometrics - 03/14/24 1306       Pre Biometrics   Grip Strength 18 kg           Nutrition:   Nutrition Discharge:   Education Questionnaire Score:  Knowledge Questionnaire Score - 03/14/24 1031       Knowledge Questionnaire Score   Pre Score 15/18          Goals reviewed with patient; copy given to patient.

## 2024-04-15 ENCOUNTER — Encounter (HOSPITAL_COMMUNITY)

## 2024-04-16 NOTE — Progress Notes (Signed)
 Internal Medicine Clinic Attending  Case discussed with the resident at the time of the visit.  We reviewed the resident's history and exam and pertinent patient test results.  I agree with the assessment, diagnosis, and plan of care documented in the resident's note.

## 2024-04-17 ENCOUNTER — Encounter (HOSPITAL_COMMUNITY)

## 2024-04-22 ENCOUNTER — Encounter (HOSPITAL_COMMUNITY)

## 2024-04-24 ENCOUNTER — Encounter (HOSPITAL_COMMUNITY)

## 2024-04-28 ENCOUNTER — Telehealth: Payer: Self-pay | Admitting: *Deleted

## 2024-04-28 DIAGNOSIS — J84112 Idiopathic pulmonary fibrosis: Secondary | ICD-10-CM

## 2024-04-28 NOTE — Telephone Encounter (Signed)
 Will forward to C. [place] and [place] team.  Copied from CRM [id number]. Topic: Clinical - Order For Equipment >> Apr 25, 2024  1:08 PM Carrielelia G wrote: Reason for CRM: Asonti with [employer], is calling because patient is in need of a humidifier bottle .SABRA  Please fax order to fax# [phone]  This will be in addition to the recent order sent

## 2024-04-29 ENCOUNTER — Encounter (HOSPITAL_COMMUNITY)

## 2024-05-01 ENCOUNTER — Telehealth: Payer: Self-pay | Admitting: *Deleted

## 2024-05-01 ENCOUNTER — Encounter (HOSPITAL_COMMUNITY)

## 2024-05-01 NOTE — Telephone Encounter (Signed)
 Patient aware lab orders have been entered.  Copied from CRM #[id number]. Topic: Clinical - Request for Lab/Test Order >> Apr 29, 2024  2:28 PM Laura [name] wrote: Reason for CRM: Patient states she is supposed to have labs completed - no orders on file.  Callback number: [phone]

## 2024-05-02 ENCOUNTER — Telehealth: Payer: Self-pay

## 2024-05-02 NOTE — Telephone Encounter (Signed)
 Copied from CRM [id number]. Topic: Clinical - Medical Advice >> May 01, 2024  1:12 PM Laura [name] wrote: Reason for CRM: Patient would like to confirm if her labs needs to be completed before her appointment on 12/1 or if she can do it the same day. Patient would like [name] call back from the office.   Callback number: [phone]

## 2024-05-05 NOTE — Telephone Encounter (Signed)
 Called and spoke with the pt. Pt states she prefers not to come back to the office before 12/1. Pt already has PFT & appt same day. I have scheduled the pt lab appt for 12/1. Nothing further needed.

## 2024-05-06 ENCOUNTER — Encounter (HOSPITAL_COMMUNITY)

## 2024-05-08 ENCOUNTER — Encounter (HOSPITAL_COMMUNITY)

## 2024-05-13 ENCOUNTER — Encounter (HOSPITAL_COMMUNITY)

## 2024-05-19 ENCOUNTER — Ambulatory Visit: Admitting: Pulmonary Disease

## 2024-05-19 ENCOUNTER — Other Ambulatory Visit

## 2024-05-19 ENCOUNTER — Encounter

## 2024-05-19 DIAGNOSIS — Z5181 Encounter for therapeutic drug level monitoring: Secondary | ICD-10-CM | POA: Diagnosis not present

## 2024-05-19 LAB — HEPATIC FUNCTION PANEL
ALT: 11 U/L (ref 0–35)
AST: 16 U/L (ref 0–37)
Albumin: 3.7 g/dL (ref 3.5–5.2)
Alkaline Phosphatase: 71 U/L (ref 39–117)
Bilirubin, Direct: 0.1 mg/dL (ref 0.0–0.3)
Total Bilirubin: 0.5 mg/dL (ref 0.2–1.2)
Total Protein: 8 g/dL (ref 6.0–8.3)

## 2024-05-20 ENCOUNTER — Other Ambulatory Visit: Payer: Self-pay | Admitting: Pulmonary Disease

## 2024-05-20 ENCOUNTER — Encounter (HOSPITAL_COMMUNITY)

## 2024-05-20 ENCOUNTER — Ambulatory Visit: Payer: Self-pay

## 2024-05-20 DIAGNOSIS — J849 Interstitial pulmonary disease, unspecified: Secondary | ICD-10-CM

## 2024-05-20 NOTE — Telephone Encounter (Signed)
 Refill sent for OFEV  to [facility]: [phone]   Dose: 150mg  by mouth every 12 hours   Last OV: 02/13/24 Provider: Dr. Theophilus Pertinent labs: LFTs wnl 05/19/24  Next OV: 07/15/23  Aleck Puls, PharmD, BCPS Clinical Pharmacist  [facility]

## 2024-05-22 ENCOUNTER — Ambulatory Visit: Admitting: Student

## 2024-05-22 VITALS — BP 120/72 | HR 103 | Temp 97.6°F | Ht 68.0 in | Wt 206.6 lb

## 2024-05-22 DIAGNOSIS — Z23 Encounter for immunization: Secondary | ICD-10-CM | POA: Diagnosis not present

## 2024-05-22 DIAGNOSIS — J84112 Idiopathic pulmonary fibrosis: Secondary | ICD-10-CM

## 2024-05-22 DIAGNOSIS — R7303 Prediabetes: Secondary | ICD-10-CM

## 2024-05-22 DIAGNOSIS — Z0000 Encounter for general adult medical examination without abnormal findings: Secondary | ICD-10-CM

## 2024-05-22 DIAGNOSIS — I10 Essential (primary) hypertension: Secondary | ICD-10-CM

## 2024-05-22 LAB — GLUCOSE, CAPILLARY: Glucose-Capillary: 107 mg/dL — ABNORMAL HIGH (ref 70–99)

## 2024-05-22 LAB — POCT GLYCOSYLATED HEMOGLOBIN (HGB A1C): HbA1c, POC (prediabetic range): 6.1 % (ref 5.7–6.4)

## 2024-05-22 NOTE — Progress Notes (Signed)
 CC: Follow-up  HPI:  [name] is a 78 y.o. female living with a history stated below and presents today for follow-up. Please see problem based assessment and plan for additional details.  Past Medical History:  Diagnosis Date   Chronic back pain    mostly lower; left foot is numb all the time (11/22/2016)   Chronic kidney disease    ?renal cyst on MRI    GERD (gastroesophageal reflux disease)    Health care maintenance 12/31/2012   High cholesterol    IPF (idiopathic pulmonary fibrosis) (HCC)    Lymphadenopathy    documented on chest CT scan 12/15/2005   Substance abuse (HCC)    tobacco    Current Outpatient Medications on File Prior to Visit  Medication Sig Dispense Refill   albuterol  (VENTOLIN  HFA) 108 (90 Base) MCG/ACT inhaler Inhale 2 puffs into the lungs every 4 (four) hours as needed for wheezing or shortness of breath. 8.5 each 2   aspirin  81 MG chewable tablet Chew 81 mg by mouth daily.     fluticasone  (FLONASE ) 50 MCG/ACT nasal spray USE 1-2 SPRAYS IN EACH NOSTRIL DAILY 48 mL 3   Fluticasone -Umeclidin-Vilant (TRELEGY ELLIPTA ) 200-62.5-25 MCG/ACT AEPB Inhale 1 puff into the lungs daily. 60 each 11   gabapentin  (NEURONTIN ) 600 MG tablet Take 1 tablet (600 mg total) by mouth 4 (four) times daily. 120 tablet 3   GEMTESA  75 MG TABS Take 1 tablet by mouth daily.     metoprolol succinate (TOPROL-XL) 25 MG 24 hr tablet Take 50 mg by mouth daily.     OFEV  150 MG CAPS TAKE 1 CAPSULE BY MOUTH EVERY 12 HOURS WITH FOOD 60 capsule 3   pantoprazole  (PROTONIX ) 40 MG tablet Take 1 tablet (40 mg total) by mouth 2 (two) times daily before a meal. 90 tablet 2   rosuvastatin  (CRESTOR ) 20 MG tablet Take 1 tablet (20 mg total) by mouth daily. 90 tablet 3   No current facility-administered medications on file prior to visit.    Family History  Problem Relation Age of Onset   Diabetes Father    Hypertension Father     Social History   Socioeconomic History   Marital  status: Divorced    Spouse name: Not on file   Number of children: 0   Years of education: Not on file   Highest education level: Some college, no degree  Occupational History    Employer: UNEMPLOYED   Occupation: Disabled/Retired  Tobacco Use   Smoking status: Former    Current packs/day: 0.00    Average packs/day: 0.5 packs/day for 47.0 years (23.5 ttl pk-yrs)    Types: Cigarettes    Start date: 05/13/1973    Quit date: 05/13/2020    Years since quitting: 4.0   Smokeless tobacco: Never   Tobacco comments:    stopped around Thanksgiving   Vaping Use   Vaping status: Never Used  Substance and Sexual Activity   Alcohol use: Not Currently    Comment: 11/22/2016 2 drinks q other holiday   Drug use: Not Currently    Types: Marijuana    Comment: 66/2018 I stopped in the 1970s   Sexual activity: Yes  Other Topics Concern   Not on file  Social History Narrative   Lives alone.   Social Drivers of Corporate Investment Banker Strain: Low Risk  (05/02/2023)   Overall Financial Resource Strain (CARDIA)    Difficulty of Paying Living Expenses: Not hard at all  Food Insecurity: No Food Insecurity (05/02/2023)   Hunger Vital Sign    Worried About Running Out of Food in the Last Year: Never true    Ran Out of Food in the Last Year: Never true  Transportation Needs: No Transportation Needs (05/02/2023)   PRAPARE - Administrator, Civil Service (Medical): No    Lack of Transportation (Non-Medical): No  Physical Activity: Inactive (05/02/2023)   Exercise Vital Sign    Days of Exercise per Week: 0 days    Minutes of Exercise per Session: 0 min  Stress: No Stress Concern Present (05/02/2023)   [employer] of Occupational Health - Occupational Stress Questionnaire    Feeling of Stress : Not at all  Social Connections: Moderately Isolated (05/02/2023)   Social Connection and Isolation Panel    Frequency of Communication with Friends and Family: More than three times  a week    Frequency of Social Gatherings with Friends and Family: More than three times a week    Attends Religious Services: More than 4 times per year    Active Member of [employer] or Organizations: No    Attends Banker Meetings: Never    Marital Status: Divorced  Catering Manager Violence: Patient Unable To Answer (05/02/2023)   Humiliation, Afraid, Rape, and Kick questionnaire    Fear of Current or Ex-Partner: Patient unable to answer    Emotionally Abused: Patient unable to answer    Physically Abused: Patient unable to answer    Sexually Abused: Patient unable to answer    Review of Systems: ROS negative except for what is noted on the assessment and plan.  Vitals:   05/22/24 1040  BP: 120/72  Pulse: (!) 103  Temp: 97.6 F (36.4 C)  TempSrc: Oral  SpO2: 96%  Weight: 206 lb 9.6 oz (93.7 kg)  Height: 5' 8 (1.727 m)    Physical Exam: Constitutional: sitting in chair, in no acute distress Cardiovascular: regular rate and rhythm, no m/r/g, no edema Pulmonary/Chest: normal work of breathing on room air (had O2 off), inspiratory crackles bilaterally Skin: warm and dry Psych: normal mood and behavior  Assessment & Plan:    Patient discussed with Dr. Francesco  IPF (idiopathic pulmonary fibrosis) (HCC) Follows closely with Pulmonology. Remains on 3 L [place] at baseline. Continue Albuterol  prn, Trelegy, and recently started nintedanib. Pulmonology following LFTs. No concerns today. Continue above.   Pre-diabetes A1c is 6.1. Diet controlled. Counseled on the importance of healthy eating and light exercise as tolerated.   HTN (hypertension) Normotensive. Continue Toprol-XL 25 mg daily.    Norman Lobstein, D.O. [facility], PGY-2 Date 05/23/2024 Time 8:14 AM

## 2024-05-23 NOTE — Assessment & Plan Note (Signed)
 Normotensive. Continue Toprol-XL 25 mg daily.

## 2024-05-23 NOTE — Assessment & Plan Note (Signed)
 A1c is 6.1. Diet controlled. Counseled on the importance of healthy eating and light exercise as tolerated.

## 2024-05-23 NOTE — Assessment & Plan Note (Signed)
 Follows closely with Pulmonology. Remains on 3 L [place] at baseline. Continue Albuterol  prn, Trelegy, and recently started nintedanib. Pulmonology following LFTs. No concerns today. Continue above.

## 2024-05-23 NOTE — Assessment & Plan Note (Signed)
 Flu and Tdap today.

## 2024-05-27 ENCOUNTER — Encounter (HOSPITAL_COMMUNITY)

## 2024-05-29 ENCOUNTER — Encounter (HOSPITAL_COMMUNITY)

## 2024-05-30 NOTE — Progress Notes (Signed)
 Internal Medicine Clinic Attending  Case discussed with the resident at the time of the visit.  We reviewed the resident's history and exam and pertinent patient test results.  I agree with the assessment, diagnosis, and plan of care documented in the resident's note.

## 2024-06-03 ENCOUNTER — Encounter (HOSPITAL_COMMUNITY)

## 2024-06-05 ENCOUNTER — Encounter (HOSPITAL_COMMUNITY)

## 2024-06-08 ENCOUNTER — Other Ambulatory Visit: Payer: Self-pay | Admitting: Student

## 2024-06-08 DIAGNOSIS — K219 Gastro-esophageal reflux disease without esophagitis: Secondary | ICD-10-CM

## 2024-06-09 NOTE — Telephone Encounter (Signed)
 Medication sent to pharmacy

## 2024-06-10 ENCOUNTER — Encounter (HOSPITAL_COMMUNITY)

## 2024-06-25 ENCOUNTER — Ambulatory Visit
Admission: RE | Admit: 2024-06-25 | Discharge: 2024-06-25 | Disposition: A | Discharge status: Home / Self Care | Source: Ambulatory Visit | Attending: Pulmonary Disease | Admitting: Pulmonary Disease

## 2024-06-25 DIAGNOSIS — R911 Solitary pulmonary nodule: Secondary | ICD-10-CM

## 2024-06-25 DIAGNOSIS — J849 Interstitial pulmonary disease, unspecified: Secondary | ICD-10-CM

## 2024-07-10 ENCOUNTER — Encounter (HOSPITAL_COMMUNITY): Payer: Self-pay | Admitting: Pharmacist

## 2024-07-14 ENCOUNTER — Ambulatory Visit: Admitting: Pulmonary Disease

## 2024-07-14 ENCOUNTER — Encounter

## 2024-07-15 ENCOUNTER — Ambulatory Visit: Payer: Self-pay | Admitting: Pulmonary Disease

## 2024-07-16 ENCOUNTER — Other Ambulatory Visit: Payer: Self-pay | Admitting: Student

## 2024-07-16 DIAGNOSIS — M961 Postlaminectomy syndrome, not elsewhere classified: Secondary | ICD-10-CM

## 2024-08-11 ENCOUNTER — Encounter

## 2024-08-12 ENCOUNTER — Ambulatory Visit: Admitting: Pulmonary Disease
